# Patient Record
Sex: Male | Born: 1960
Health system: Southern US, Community
[De-identification: ages and names within clinical notes are randomized; demographics above are authoritative.]

## PROBLEM LIST (undated history)

## (undated) DIAGNOSIS — Z923 Personal history of irradiation: Secondary | ICD-10-CM

## (undated) DIAGNOSIS — IMO0002 Reserved for concepts with insufficient information to code with codable children: Secondary | ICD-10-CM

## (undated) DIAGNOSIS — E785 Hyperlipidemia, unspecified: Secondary | ICD-10-CM

## (undated) DIAGNOSIS — E0590 Thyrotoxicosis, unspecified without thyrotoxic crisis or storm: Secondary | ICD-10-CM

## (undated) DIAGNOSIS — I10 Essential (primary) hypertension: Secondary | ICD-10-CM

## (undated) DIAGNOSIS — R011 Cardiac murmur, unspecified: Secondary | ICD-10-CM

## (undated) DIAGNOSIS — F329 Major depressive disorder, single episode, unspecified: Secondary | ICD-10-CM

## (undated) DIAGNOSIS — B029 Zoster without complications: Secondary | ICD-10-CM

## (undated) DIAGNOSIS — G7080 Lambert-Eaton syndrome, unspecified: Secondary | ICD-10-CM

## (undated) DIAGNOSIS — M549 Dorsalgia, unspecified: Secondary | ICD-10-CM

## (undated) DIAGNOSIS — E669 Obesity, unspecified: Secondary | ICD-10-CM

## (undated) DIAGNOSIS — R55 Syncope and collapse: Secondary | ICD-10-CM

## (undated) DIAGNOSIS — R9430 Abnormal result of cardiovascular function study, unspecified: Secondary | ICD-10-CM

## (undated) DIAGNOSIS — Z9289 Personal history of other medical treatment: Secondary | ICD-10-CM

## (undated) DIAGNOSIS — I4581 Long QT syndrome: Secondary | ICD-10-CM

## (undated) DIAGNOSIS — Q245 Malformation of coronary vessels: Secondary | ICD-10-CM

## (undated) DIAGNOSIS — S82409A Unspecified fracture of shaft of unspecified fibula, initial encounter for closed fracture: Secondary | ICD-10-CM

## (undated) DIAGNOSIS — M1990 Unspecified osteoarthritis, unspecified site: Secondary | ICD-10-CM

## (undated) DIAGNOSIS — E119 Type 2 diabetes mellitus without complications: Secondary | ICD-10-CM

## (undated) DIAGNOSIS — F32A Depression, unspecified: Secondary | ICD-10-CM

## (undated) HISTORY — DX: Cardiac murmur, unspecified: R01.1

## (undated) HISTORY — DX: Type 2 diabetes mellitus without complications: E11.9

## (undated) HISTORY — DX: Thyrotoxicosis, unspecified without thyrotoxic crisis or storm: E05.90

## (undated) HISTORY — DX: Syncope and collapse: R55

## (undated) HISTORY — DX: Hyperlipidemia, unspecified: E78.5

## (undated) HISTORY — DX: Malformation of coronary vessels: Q24.5

## (undated) HISTORY — DX: Zoster without complications: B02.9

## (undated) HISTORY — DX: Lambert-Eaton syndrome, unspecified: G70.80

## (undated) HISTORY — DX: Personal history of irradiation: Z92.3

## (undated) HISTORY — DX: Essential (primary) hypertension: I10

## (undated) HISTORY — DX: Long QT syndrome: I45.81

## (undated) HISTORY — PX: PORTACATH PLACEMENT: SHX2246

## (undated) HISTORY — DX: Obesity, unspecified: E66.9

## (undated) HISTORY — DX: Reserved for concepts with insufficient information to code with codable children: IMO0002

## (undated) HISTORY — PX: TONSILLECTOMY: SUR1361

## (undated) HISTORY — DX: Unspecified fracture of shaft of unspecified fibula, initial encounter for closed fracture: S82.409A

## (undated) HISTORY — PX: KNEE SURGERY: SHX244

## (undated) HISTORY — DX: Abnormal result of cardiovascular function study, unspecified: R94.30

---

## 1998-03-11 ENCOUNTER — Ambulatory Visit (HOSPITAL_COMMUNITY): Admission: RE | Admit: 1998-03-11 | Discharge: 1998-03-11 | Payer: Self-pay | Admitting: Neurology

## 1998-04-01 ENCOUNTER — Encounter (HOSPITAL_COMMUNITY): Admission: RE | Admit: 1998-04-01 | Discharge: 1998-06-30 | Payer: Self-pay | Admitting: Neurology

## 1998-07-15 ENCOUNTER — Encounter (HOSPITAL_COMMUNITY): Admission: RE | Admit: 1998-07-15 | Discharge: 1998-10-13 | Payer: Self-pay | Admitting: Neurology

## 1998-10-14 ENCOUNTER — Encounter (HOSPITAL_COMMUNITY): Admission: RE | Admit: 1998-10-14 | Discharge: 1999-01-12 | Payer: Self-pay | Admitting: Neurology

## 1999-01-13 ENCOUNTER — Encounter (HOSPITAL_COMMUNITY): Admission: RE | Admit: 1999-01-13 | Discharge: 1999-04-13 | Payer: Self-pay | Admitting: Neurology

## 1999-04-15 ENCOUNTER — Encounter: Admission: RE | Admit: 1999-04-15 | Discharge: 1999-07-14 | Payer: Self-pay | Admitting: Neurology

## 1999-07-15 ENCOUNTER — Encounter (HOSPITAL_COMMUNITY): Admission: RE | Admit: 1999-07-15 | Discharge: 1999-10-13 | Payer: Self-pay | Admitting: Neurology

## 1999-09-24 ENCOUNTER — Ambulatory Visit (HOSPITAL_COMMUNITY): Admission: RE | Admit: 1999-09-24 | Discharge: 1999-09-24 | Payer: Self-pay | Admitting: Family Medicine

## 1999-10-03 ENCOUNTER — Encounter: Payer: Self-pay | Admitting: Vascular Surgery

## 1999-10-03 ENCOUNTER — Ambulatory Visit (HOSPITAL_COMMUNITY): Admission: RE | Admit: 1999-10-03 | Discharge: 1999-10-03 | Payer: Self-pay | Admitting: Vascular Surgery

## 1999-10-20 ENCOUNTER — Encounter (HOSPITAL_COMMUNITY): Admission: RE | Admit: 1999-10-20 | Discharge: 2000-01-18 | Payer: Self-pay | Admitting: Neurology

## 1999-10-28 ENCOUNTER — Emergency Department (HOSPITAL_COMMUNITY): Admission: EM | Admit: 1999-10-28 | Discharge: 1999-10-28 | Payer: Self-pay | Admitting: Emergency Medicine

## 1999-11-12 ENCOUNTER — Encounter: Payer: Self-pay | Admitting: Vascular Surgery

## 1999-11-12 ENCOUNTER — Ambulatory Visit (HOSPITAL_COMMUNITY): Admission: RE | Admit: 1999-11-12 | Discharge: 1999-11-12 | Payer: Self-pay | Admitting: Vascular Surgery

## 1999-12-25 ENCOUNTER — Ambulatory Visit (HOSPITAL_COMMUNITY): Admission: RE | Admit: 1999-12-25 | Discharge: 1999-12-25 | Payer: Self-pay | Admitting: Vascular Surgery

## 1999-12-25 ENCOUNTER — Encounter: Payer: Self-pay | Admitting: Vascular Surgery

## 2000-01-12 ENCOUNTER — Ambulatory Visit (HOSPITAL_COMMUNITY): Admission: RE | Admit: 2000-01-12 | Discharge: 2000-01-12 | Payer: Self-pay | Admitting: Vascular Surgery

## 2000-01-12 ENCOUNTER — Encounter: Payer: Self-pay | Admitting: Vascular Surgery

## 2000-01-20 ENCOUNTER — Encounter (HOSPITAL_COMMUNITY): Admission: RE | Admit: 2000-01-20 | Discharge: 2000-04-19 | Payer: Self-pay | Admitting: Neurology

## 2000-04-20 ENCOUNTER — Encounter (HOSPITAL_COMMUNITY): Admission: RE | Admit: 2000-04-20 | Discharge: 2000-07-19 | Payer: Self-pay | Admitting: Neurology

## 2000-07-20 ENCOUNTER — Encounter (HOSPITAL_COMMUNITY): Admission: RE | Admit: 2000-07-20 | Discharge: 2000-10-18 | Payer: Self-pay | Admitting: Neurology

## 2000-10-19 ENCOUNTER — Encounter (HOSPITAL_COMMUNITY): Admission: RE | Admit: 2000-10-19 | Discharge: 2001-01-17 | Payer: Self-pay | Admitting: Neurology

## 2000-11-12 ENCOUNTER — Encounter: Payer: Self-pay | Admitting: Neurology

## 2001-01-18 ENCOUNTER — Encounter (HOSPITAL_COMMUNITY): Admission: RE | Admit: 2001-01-18 | Discharge: 2001-04-18 | Payer: Self-pay | Admitting: Neurology

## 2001-04-19 ENCOUNTER — Encounter (HOSPITAL_COMMUNITY): Admission: RE | Admit: 2001-04-19 | Discharge: 2001-07-18 | Payer: Self-pay | Admitting: Neurology

## 2001-07-19 ENCOUNTER — Encounter (HOSPITAL_COMMUNITY): Admission: RE | Admit: 2001-07-19 | Discharge: 2001-10-17 | Payer: Self-pay | Admitting: Neurology

## 2001-10-18 ENCOUNTER — Encounter (HOSPITAL_COMMUNITY): Admission: RE | Admit: 2001-10-18 | Discharge: 2002-01-16 | Payer: Self-pay | Admitting: Neurology

## 2002-01-17 ENCOUNTER — Encounter (HOSPITAL_COMMUNITY): Admission: RE | Admit: 2002-01-17 | Discharge: 2002-04-17 | Payer: Self-pay | Admitting: Neurology

## 2002-03-06 ENCOUNTER — Encounter: Payer: Self-pay | Admitting: Vascular Surgery

## 2002-03-06 ENCOUNTER — Ambulatory Visit (HOSPITAL_COMMUNITY): Admission: RE | Admit: 2002-03-06 | Discharge: 2002-03-06 | Payer: Self-pay | Admitting: Vascular Surgery

## 2002-04-20 ENCOUNTER — Encounter (HOSPITAL_COMMUNITY): Admission: RE | Admit: 2002-04-20 | Discharge: 2002-07-19 | Payer: Self-pay | Admitting: Neurology

## 2002-04-21 ENCOUNTER — Encounter: Payer: Self-pay | Admitting: Vascular Surgery

## 2002-04-21 ENCOUNTER — Ambulatory Visit (HOSPITAL_COMMUNITY): Admission: RE | Admit: 2002-04-21 | Discharge: 2002-04-21 | Payer: Self-pay | Admitting: Vascular Surgery

## 2002-06-01 ENCOUNTER — Inpatient Hospital Stay (HOSPITAL_COMMUNITY): Admission: AD | Admit: 2002-06-01 | Discharge: 2002-06-02 | Payer: Self-pay | Admitting: Neurology

## 2002-07-25 ENCOUNTER — Encounter (HOSPITAL_COMMUNITY): Admission: RE | Admit: 2002-07-25 | Discharge: 2002-10-23 | Payer: Self-pay | Admitting: Neurology

## 2002-10-24 ENCOUNTER — Encounter (HOSPITAL_COMMUNITY): Admission: RE | Admit: 2002-10-24 | Discharge: 2003-01-22 | Payer: Self-pay | Admitting: Neurology

## 2003-01-23 ENCOUNTER — Encounter (HOSPITAL_COMMUNITY): Admission: RE | Admit: 2003-01-23 | Discharge: 2003-04-23 | Payer: Self-pay | Admitting: Neurology

## 2003-04-24 ENCOUNTER — Encounter (HOSPITAL_COMMUNITY): Admission: RE | Admit: 2003-04-24 | Discharge: 2003-07-23 | Payer: Self-pay | Admitting: Neurology

## 2003-07-24 ENCOUNTER — Encounter (HOSPITAL_COMMUNITY): Admission: RE | Admit: 2003-07-24 | Discharge: 2003-10-22 | Payer: Self-pay | Admitting: Neurology

## 2003-08-03 ENCOUNTER — Emergency Department (HOSPITAL_COMMUNITY): Admission: EM | Admit: 2003-08-03 | Discharge: 2003-08-03 | Payer: Self-pay | Admitting: Emergency Medicine

## 2003-11-21 ENCOUNTER — Encounter (HOSPITAL_COMMUNITY): Admission: RE | Admit: 2003-11-21 | Discharge: 2004-02-19 | Payer: Self-pay | Admitting: Neurology

## 2004-03-05 ENCOUNTER — Encounter (HOSPITAL_COMMUNITY): Admission: RE | Admit: 2004-03-05 | Discharge: 2004-06-03 | Payer: Self-pay | Admitting: Neurology

## 2004-06-04 ENCOUNTER — Encounter (HOSPITAL_COMMUNITY): Admission: RE | Admit: 2004-06-04 | Discharge: 2004-09-02 | Payer: Self-pay | Admitting: Neurology

## 2004-08-07 ENCOUNTER — Ambulatory Visit: Payer: Self-pay | Admitting: Family Medicine

## 2004-08-28 ENCOUNTER — Ambulatory Visit: Payer: Self-pay | Admitting: Family Medicine

## 2004-09-23 ENCOUNTER — Encounter (HOSPITAL_COMMUNITY): Admission: RE | Admit: 2004-09-23 | Discharge: 2004-12-22 | Payer: Self-pay | Admitting: Neurology

## 2004-10-29 ENCOUNTER — Ambulatory Visit: Payer: Self-pay | Admitting: Family Medicine

## 2004-12-16 ENCOUNTER — Ambulatory Visit: Payer: Self-pay | Admitting: Family Medicine

## 2004-12-18 ENCOUNTER — Ambulatory Visit (HOSPITAL_COMMUNITY): Admission: RE | Admit: 2004-12-18 | Discharge: 2004-12-18 | Payer: Self-pay | Admitting: Family Medicine

## 2004-12-23 ENCOUNTER — Encounter (HOSPITAL_COMMUNITY): Admission: RE | Admit: 2004-12-23 | Discharge: 2005-03-23 | Payer: Self-pay | Admitting: Neurology

## 2004-12-25 ENCOUNTER — Emergency Department (HOSPITAL_COMMUNITY): Admission: EM | Admit: 2004-12-25 | Discharge: 2004-12-25 | Payer: Self-pay | Admitting: Emergency Medicine

## 2005-01-14 ENCOUNTER — Ambulatory Visit: Payer: Self-pay | Admitting: Internal Medicine

## 2005-01-15 ENCOUNTER — Ambulatory Visit: Payer: Self-pay | Admitting: Internal Medicine

## 2005-01-15 ENCOUNTER — Ambulatory Visit (HOSPITAL_COMMUNITY): Admission: RE | Admit: 2005-01-15 | Discharge: 2005-01-15 | Payer: Self-pay | Admitting: Internal Medicine

## 2005-01-19 ENCOUNTER — Encounter (HOSPITAL_COMMUNITY): Admission: RE | Admit: 2005-01-19 | Discharge: 2005-02-18 | Payer: Self-pay | Admitting: Internal Medicine

## 2005-01-28 ENCOUNTER — Ambulatory Visit (HOSPITAL_COMMUNITY): Admission: RE | Admit: 2005-01-28 | Discharge: 2005-01-28 | Payer: Self-pay | Admitting: Gastroenterology

## 2005-02-10 ENCOUNTER — Ambulatory Visit: Payer: Self-pay | Admitting: Family Medicine

## 2005-02-10 ENCOUNTER — Ambulatory Visit (HOSPITAL_COMMUNITY): Admission: RE | Admit: 2005-02-10 | Discharge: 2005-02-10 | Payer: Self-pay | Admitting: Family Medicine

## 2005-02-11 ENCOUNTER — Ambulatory Visit: Payer: Self-pay | Admitting: Family Medicine

## 2005-03-23 ENCOUNTER — Ambulatory Visit (HOSPITAL_COMMUNITY): Admission: RE | Admit: 2005-03-23 | Discharge: 2005-03-23 | Payer: Self-pay | Admitting: Neurology

## 2005-03-23 ENCOUNTER — Encounter (HOSPITAL_COMMUNITY): Admission: RE | Admit: 2005-03-23 | Discharge: 2005-06-21 | Payer: Self-pay | Admitting: Neurology

## 2005-04-01 ENCOUNTER — Encounter: Admission: RE | Admit: 2005-04-01 | Discharge: 2005-04-01 | Payer: Self-pay | Admitting: Gastroenterology

## 2005-06-26 ENCOUNTER — Ambulatory Visit: Payer: Self-pay | Admitting: Cardiology

## 2005-06-26 ENCOUNTER — Ambulatory Visit: Payer: Self-pay | Admitting: Family Medicine

## 2005-06-26 ENCOUNTER — Inpatient Hospital Stay (HOSPITAL_COMMUNITY): Admission: EM | Admit: 2005-06-26 | Discharge: 2005-06-27 | Payer: Self-pay | Admitting: Emergency Medicine

## 2005-07-01 ENCOUNTER — Encounter (HOSPITAL_COMMUNITY): Admission: RE | Admit: 2005-07-01 | Discharge: 2005-09-29 | Payer: Self-pay | Admitting: Neurology

## 2005-07-06 ENCOUNTER — Ambulatory Visit: Payer: Self-pay | Admitting: Family Medicine

## 2005-08-04 ENCOUNTER — Ambulatory Visit: Payer: Self-pay | Admitting: Family Medicine

## 2005-08-10 ENCOUNTER — Ambulatory Visit: Payer: Self-pay | Admitting: Family Medicine

## 2005-09-02 ENCOUNTER — Ambulatory Visit: Payer: Self-pay | Admitting: Family Medicine

## 2005-10-08 ENCOUNTER — Encounter (HOSPITAL_COMMUNITY): Admission: RE | Admit: 2005-10-08 | Discharge: 2005-12-25 | Payer: Self-pay | Admitting: Neurology

## 2006-02-05 ENCOUNTER — Encounter (HOSPITAL_COMMUNITY): Admission: RE | Admit: 2006-02-05 | Discharge: 2006-05-06 | Payer: Self-pay | Admitting: Neurology

## 2006-05-11 ENCOUNTER — Encounter (HOSPITAL_COMMUNITY): Admission: RE | Admit: 2006-05-11 | Discharge: 2006-08-09 | Payer: Self-pay | Admitting: Neurology

## 2006-08-30 IMAGING — RF DG UGI W/ SMALL BOWEL
13 of 21 series · 13 of 21 positions shown · non-contrast
Comparison: none

CLINICAL DATA: Abdominal pain with nausea and diarrhea. 
 KUB, UPPER G.I. WITH SMALL BOWEL: 
 KUB:  Unremarkable.  
 UPPER G.I. AND SMALL BOWEL:
 Because of the patient?s neurological condition, we were not able to do a double contrast exam.  The GI and small bowel was done with thin barium.
 Swallowing mechanism normal.  No lesions of the esophagus demonstrated.  No obvious ulceration or lesions of the stomach or duodenal bulb.  C-loop and remainder of duodenum normal.  Overhead images at 0 and 30 minutes post ingestion were obtained.  At 30 minutes, contrast was already well into the colon.  At fluoroscopy, peristalsis is brisk, but there are no lesions demonstrated.

[Series 1: run · 1 of 1 slices shown (1 of 12)]
[im 1/1]
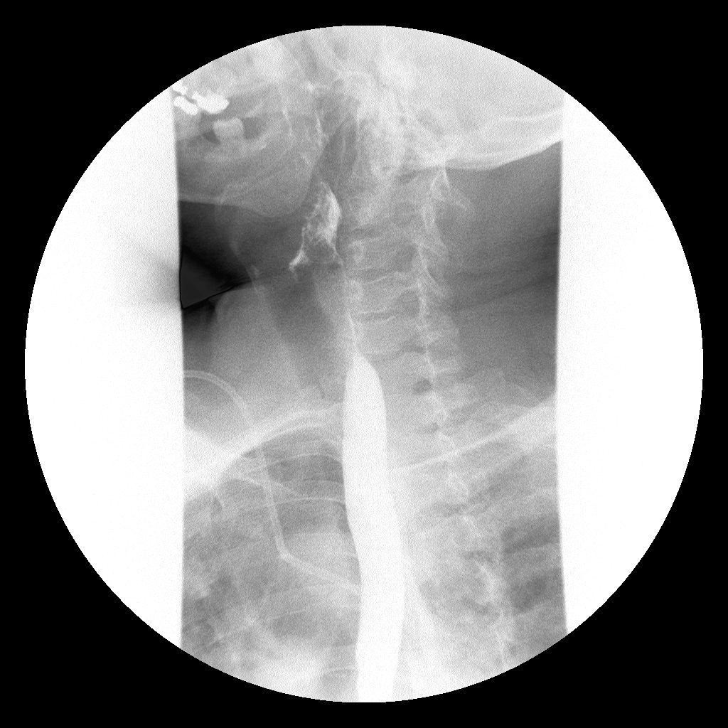

[Series 3: run · 1 of 1 slices shown (2 of 12)]
[im 1/1]
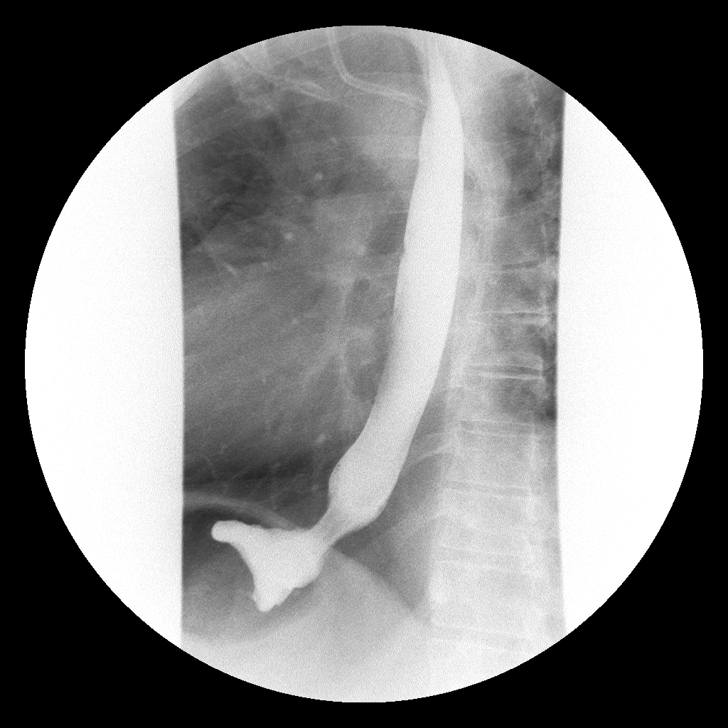

[Series 5: run · 1 of 1 slices shown (3 of 12)]
[im 1/1]
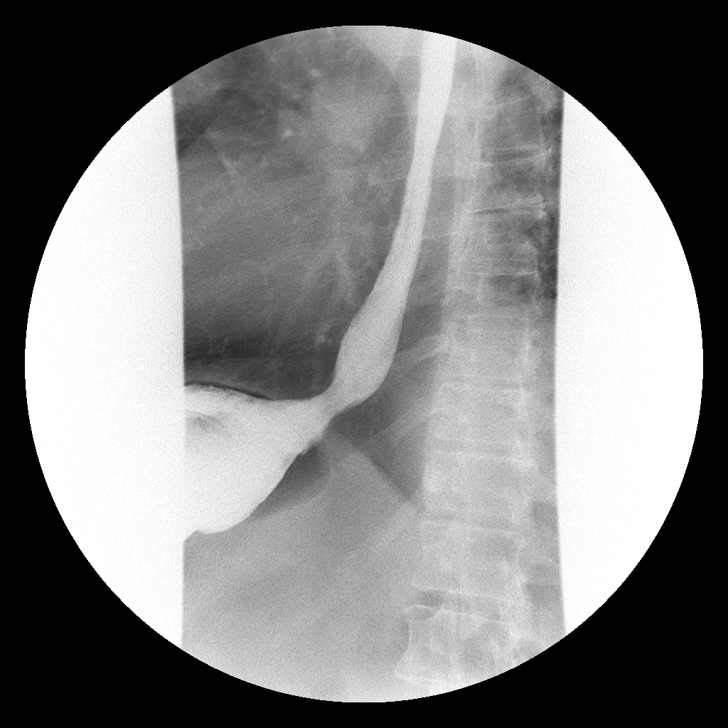

[Series 6: run · 1 of 1 slices shown (4 of 12)]
[im 1/1]
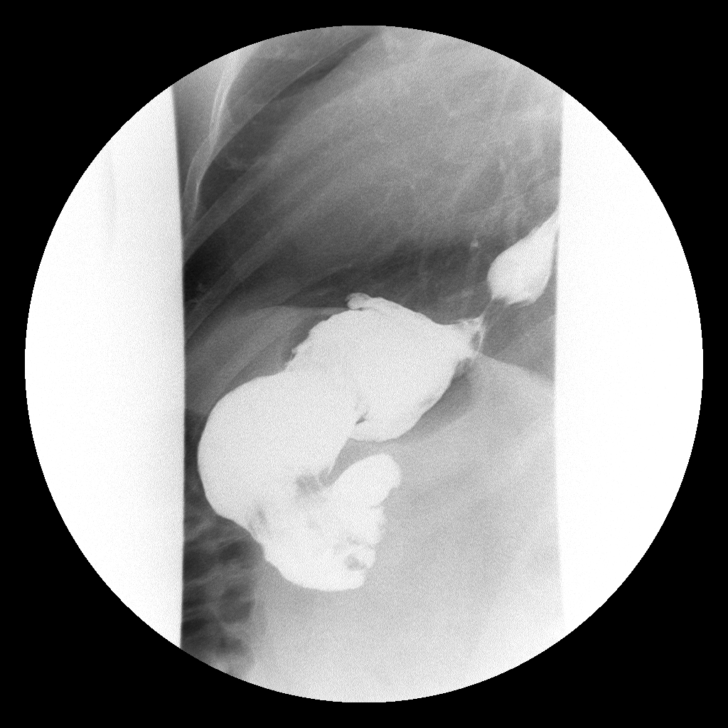

[Series 8: run · 1 of 1 slices shown (5 of 12)]
[im 1/1]
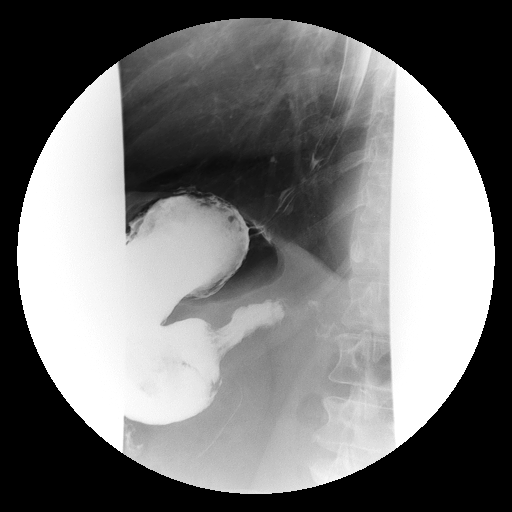

[Series 9: run · 1 of 1 slices shown (6 of 12)]
[im 1/1]
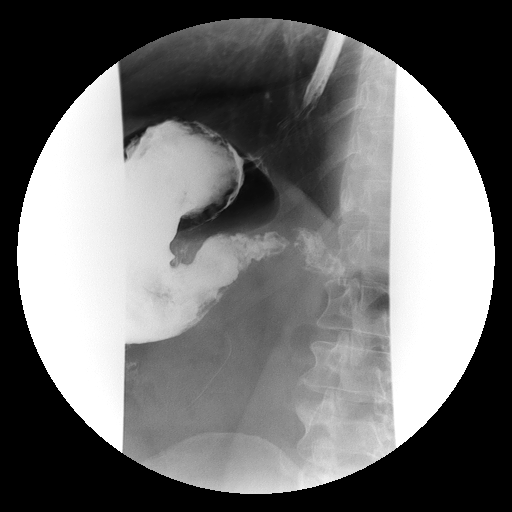

[Series 11: run · 1 of 1 slices shown (7 of 12)]
[im 1/1]
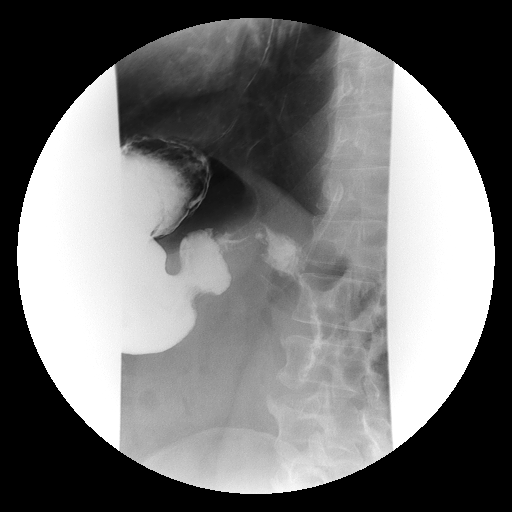

[Series 13: run · 1 of 1 slices shown (8 of 12)]
[im 1/1]
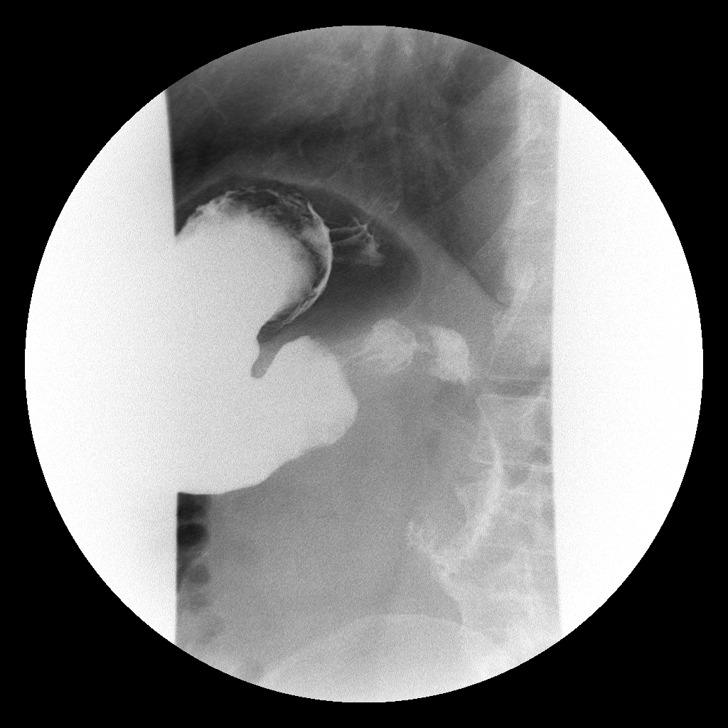

[Series 14: run · 1 of 1 slices shown (9 of 12)]
[im 1/1]
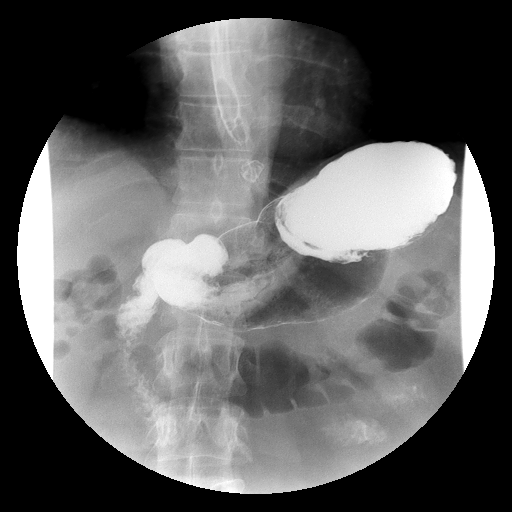

[Series 16: run · 1 of 1 slices shown (10 of 12)]
[im 1/1]
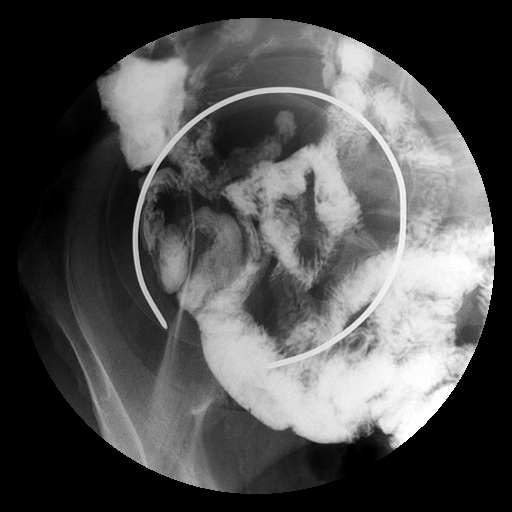

[Series 17: run · 1 of 1 slices shown (11 of 12)]
[im 1/1]
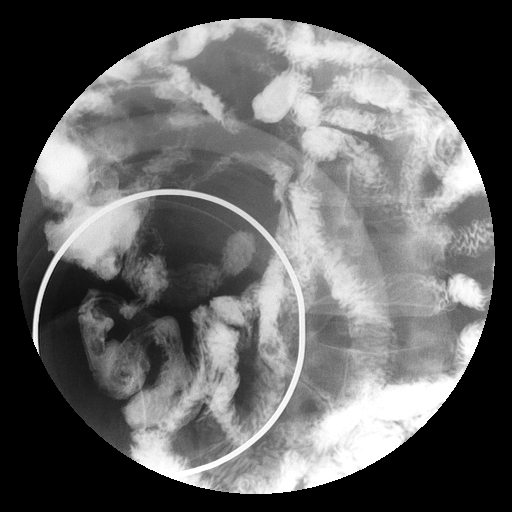

[Series 19: run · 1 of 1 slices shown (12 of 12)]
[im 1/1]
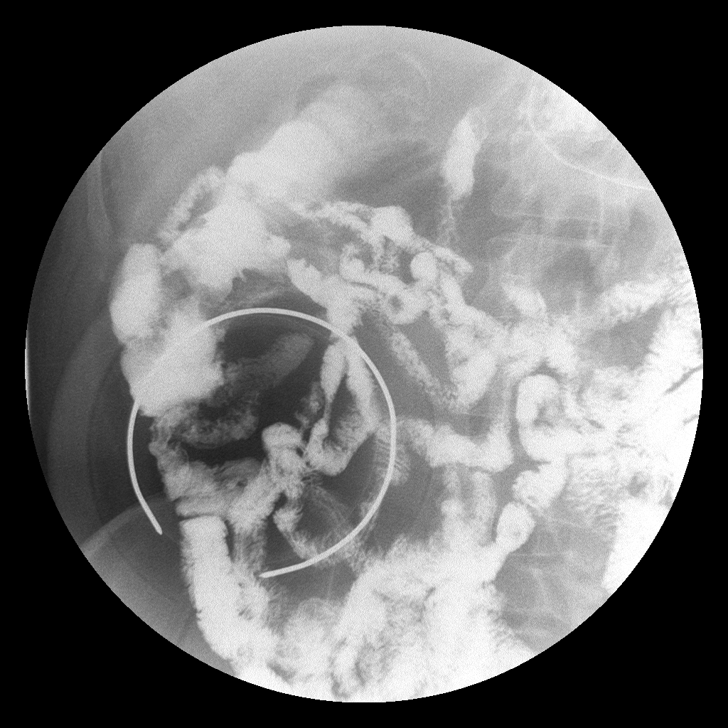

[Series 1002: view not recorded · 0.20mm/px · 1 of 1 slices shown]
[im 1/1]
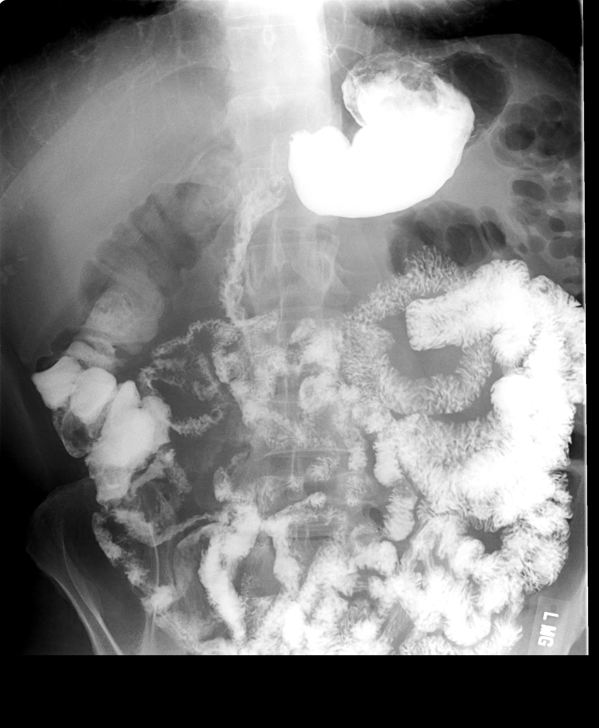

[13 of 21 positions shown; findings below may reference images not displayed]

IMPRESSION: 1.  No pathology in the upper G.I. tract demonstrated.
 2.  The small bowel shows no lesions, but there is brisk peristalsis with rapid transit to the colon.

## 2006-11-17 ENCOUNTER — Encounter (HOSPITAL_COMMUNITY): Admission: RE | Admit: 2006-11-17 | Discharge: 2007-02-15 | Payer: Self-pay | Admitting: Neurology

## 2007-02-16 ENCOUNTER — Encounter (HOSPITAL_COMMUNITY): Admission: RE | Admit: 2007-02-16 | Discharge: 2007-04-13 | Payer: Self-pay | Admitting: Neurology

## 2008-05-24 ENCOUNTER — Encounter: Admission: RE | Admit: 2008-05-24 | Discharge: 2008-08-22 | Payer: Self-pay | Admitting: Orthopedic Surgery

## 2010-05-03 ENCOUNTER — Encounter: Payer: Self-pay | Admitting: Gastroenterology
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
## 2010-08-29 NOTE — Consult Note (Signed)
[name], [name]                ACCOUNT NO.:  [id number]   MEDICAL RECORD NO.:  [id number]          PATIENT TYPE:  AMB   LOCATION:  DAY                           FACILITY:  [facility]   PHYSICIAN:  Lionel December, M.D.    DATE OF BIRTH:  11-Oct-1960   DATE OF CONSULTATION:  01/14/2005  DATE OF DISCHARGE:                                   CONSULTATION   REASON FOR CONSULTATION:  Right upper quadrant abdominal pain and  indigestion.   HISTORY OF PRESENT ILLNESS:  The patient is a 50 year old Caucasian  gentleman with a history of myasthenia gravis.  He presents today for  further evaluation of right upper quadrant abdominal pain and indigestion.  For the past several months he has been having problems with indigestion and  heartburn. It is so severe at times that it goes into his neck.  Recently he  had burning into his neck and left arm and went to [facility] and  was evaluated for an MI.  A workup was negative including cardiac enzymes.  He also had an unremarkable chest x-ray.  He was given Pepcid, which has not  seemed to help.  He also notes that he has right upper quadrant abdominal  pain, which radiates into his right shoulder blade.  When he lays down he  feels a lot of burning and hot feeling, which goes up into his chest.  He  has taken Mylanta, which seems to help.  He feels like his whole esophagus  is raw.  He is having a lot of belching.  He has a problem swallowing his  pills at times.  The right upper quadrant pain is worse about 1-1/2 to 2  hours after meals.  He has had nausea but vomited only once.  He has never  had an EGD.  He had an abdominal ultrasound, which revealed diffuse fatty  infiltration of the liver.  He has had a CT of the abdomen and pelvis, which  again showed diffuse fatty infiltration of the liver.  He had a tiny  periumbilical anterior abdominal wall hernia containing only fat.  Otherwise  unremarkable.  He has a normal amylase, lipase, LFTs,  and an unremarkable  CBC.  He was tried on Bentyl without relief.  He also received a course of  Cipro for five days, which did not seem to help.  When he went to the ED he  had repeat LFTs, which were normal.  About four days ago he bought some OTC  Prilosec, which has not made a difference as of yet.   CURRENT MEDICATIONS:  1.  Prilosec OTC 20 mg daily.  2.  Pyridostigmine 60 mg 1/4 tablet t.i.d.  3.  Hydrocodone 5/500 q.4-6 h. p.r.n.  4.  Diaminoprridine 10 mg five daily.  5.  Lunesta 3 mg q.h.s.   ALLERGIES:  NO KNOWN DRUG ALLERGIES.   PAST MEDICAL HISTORY:  1.  Hypertension.  2.  Bulging disk in his back.  3.  Lambert-Eaton myasthenia gravis for 12 years.  He has been followed at  [facility].  He received IgG treatments for five years.  Most recently he took      two rounds of Rituximab but had been doing very well since then with      regards to his myasthenia.  4.  He has a Leisure centre manager.  5.  He has had a tonsillectomy.  6.  Knee arthroscopy.   FAMILY HISTORY:  Both his mother and maternal grandmother had a  cholecystectomy for biliary dyskinesia.  His father died of alcoholic  cirrhosis.  He has a sister with some sort of autoimmune disease, which is  affecting her eye sight of one eye.   SOCIAL HISTORY:  He is married and has two children.  He is on disability.  He has never been a smoker.  He denies any alcohol use.   REVIEW OF SYSTEMS:  GASTROINTESTINAL:  See HPI.  CONSTITUTIONAL:  He has  lost about 10 pounds.  He has progressive weakness throughout the day  related to his myasthenia.  CARDIOPULMONARY:  At times he complains of  shortness of breath.  No current chest pain.   PHYSICAL EXAMINATION:  VITAL SIGNS:  Weight 208, height 5 feet 10 inches.  Temperature 98, blood pressure 120/78, pulse 64.  GENERAL:  A pleasant well-nourished well-developed Caucasian male in no  acute distress.  SKIN:  Warm and dry.  No jaundice.  HEENT:  Conjunctivae are pink.  Sclerae  nonicteric.  Oral pharyngeal mucosa  is moist and pink.  No lesions, erythema, or exudate.  NECK:  No lymphadenopathy or thyromegaly.  CHEST/LUNGS:  Clear to auscultation.  CARDIAC:  Reveals a regular rate and rhythm.  Normal S1 and S2.  No murmurs,  rubs, or gallops.  ABDOMEN:  Positive bowel sounds.  Soft, nondistended.  He has mild  epigastric tenderness with moderate tenderness in the right upper quadrant  region to deep palpation.  No organomegaly or masses.  No rebound tenderness  or guarding.  No abdominal bruits.  He has a small umbilical hernia easily  reducible and nontender.  EXTREMITIES:  No edema.   IMPRESSION:  [name] is a 50 year old gentleman with a several month  history of right upper quadrant abdominal pain, which occurs post prandial.  Also with poorly controlled typical heartburn-type symptoms.  He has had  nausea and dysphasia to pills as well.  An abdominal ultrasound revealed a  normal appearing gallbladder.  We cannot exclude biliary dyskinesia.  He  does have typical heartburn symptoms, which are not responding to proton  pump inhibitor therapy and needs to be further investigated.  Not mentioned  above, the patient has had some diarrhea more recently.  He is having greasy  stools two and three times a day, primarily post prandial.  No blood in the  stool noted.  The etiology is unclear at this time.   PLAN:  1.  Urgent EGD.  2.  HIDA scan with CCK.  3.  Continue Prilosec OTC.  4.  Further recommendations to follow.   I would like to thank Dr. Lysbeth Galas for allowing [place] to take part in the care of  this patient.      Tana Coast, P.A.      Lionel December, M.D.  Electronically Signed    LL/MEDQ  D:  01/14/2005  T:  01/14/2005  Job:  [id number]

## 2010-08-29 NOTE — Op Note (Signed)
[name], [name]                ACCOUNT NO.:  [id number]   MEDICAL RECORD NO.:  [id number]          PATIENT TYPE:  AMB   LOCATION:  DAY                           FACILITY:  [facility]   PHYSICIAN:  Lionel December, M.D.    DATE OF BIRTH:  Mar 29, 1961   DATE OF PROCEDURE:  01/15/2005  DATE OF DISCHARGE:                                 OPERATIVE REPORT   PROCEDURE:  Esophagogastroduodenoscopy.   INDICATIONS:  [name] is a 49 year old Caucasian male who presents with severe  pain in his right upper quadrant located laterally, radiating posteriorly to  interscapular region. He also complains of burning epigastric pain and  heartburn. He took Prevacid for two weeks without symptomatic improvement,  and  now he is on Prilosec. He is undergone ultrasonography and CT. Both  studies have been negative other than incidental finding of fatty liver, and  his LFTs have been normal. He is undergoing diagnostic EGD. If this study is  normal, will proceed with hepatobiliary scan although I am not convinced  that his symptoms are typical of biliary tract disease.   Procedure risks were reviewed with the patient, and informed consent was  obtained.   PREMEDICATION:  Cetacaine spray for pharyngeal topical anesthesia, Demerol  50 mg IV, Versed 5 mg IV in divided dose.   FINDINGS:  Procedure performed in endoscopy suite. The patient's vital signs  and O2 saturation were monitored during the procedure and remained stable.  The patient was placed in left lateral position, and Olympus videoscope was  passed via oropharynx without any difficulty into esophagus.   Esophagus. Mucosa of the esophagus was normal throughout. GE junction was at  40 cm and no hernia was noted.   Stomach. It was empty and distended very well insufflation. Folds of  proximal stomach were normal. Examination mucosa at body, antrum, pyloric  channel as well as angularis, fundus and cardia was normal.   Duodenum. Bulbar mucosa was normal.  Scope was passed to the second part of  duodenum where mucosa and folds were normal. Endoscope was withdrawn. The  patient tolerated the procedure well.   FINAL DIAGNOSIS:  Normal esophagogastroduodenoscopy.   RECOMMENDATIONS:  Will proceed with hepatobiliary scan with CCK.   For the time being, he will continue Prilosec at 20 mg p.o. q.a.m.   If his HIDA is normal, would ask that he be seen by his neurologist at [facility]  to make sure that this is not a neuropathic pain somehow related to his  myasthenia gravis.      Lionel December, M.D.  Electronically Signed     NR/MEDQ  D:  01/15/2005  T:  01/15/2005  Job:  [id number]   cc:   Delaney Meigs, M.D.  Fax: [phone]

## 2010-08-29 NOTE — Op Note (Signed)
[name]  Patient:    [name], [name]                       MRN: [id number] Proc. Date: 01/12/00 Adm. Date:  [id number] Attending:  Lesly Dukes                           Operative Report  PREOPERATIVE DIAGNOSIS:  Eaton-Lambert syndrome.  POSTOPERATIVE DIAGNOSIS:  Eaton-Lambert syndrome.  PROCEDURE PERFORMED: 1. Placement of new right internal jugular Port-A-Cath. 2. Removal of left subclavian Port-a-cath.  SURGEON:  Larina Earthly, M.D.  ASSISTANT:  Nurse.  ANESTHESIA:  MAC.  COMPLICATIONS:  None.  DISPOSITION:  To the recovery room stable.  DESCRIPTION OF PROCEDURE:  The patient was taken to the operating room and placed in the supine position where the area of the right and left neck, and chest were prepped and draped in the usual sterile fashion.  The patient was placed in Trendelenburg position and using local anesthesia and a finder needle, the right internal jugular vein was identified.  Next using the Seldinger technique, the guidewire was passed down the level of the right atrium.  Next, a separate incision was made over the infraclavicular area on the right lateral chest and a subcutaneous pocket was created inferior to the incision.  A 9.6 [place] Port-a-cath was passed into the pocket.  A tunnelling device was used to pass from this port to the guidewire exit site.  The catheter was cut to allow it to position well down into the right atrium.  The patient had two prior failed ports on the left side with continuous fiber and sheath on these, and it was felt that perhaps placement further down into the right atrium would prevent this from happening.  The dilator and peel away sheath was passed over the guidewire.  The dilator and guidewire were removed and the catheter was positioned down the peel away sheath which was then removed as well.  The lumen flushed and aspirated easily and was locked with heparinized saline.  The  port was sewn to the fascia of the pectoralis major with several interrupted 3-0 Prolene sutures.  The skin incision was closed with 3-0 Vicryl subcutaneous and subcuticular stitch.  Attention was turned to the left subclavian region.  The incision was made through the prior scar and carried down to isolate the prior port.  The catheter was removed in its entirety and the port was removed as well.  The wound was irrigated and was closed with 3-0 Vicryl in subcutaneous and subcuticular tissue.  Benzoin and Steri-Strips were applied. DD:  01/12/00 TD:  01/13/00 Job: [id number]

## 2010-08-29 NOTE — Discharge Summary (Signed)
[name], [name] NO.:  [id number]   MEDICAL RECORD NO.:  [id number]          PATIENT TYPE:  INP   LOCATION:  5511                         FACILITY:  [facility]   PHYSICIAN:  Rollene Rotunda, M.D.   DATE OF BIRTH:  1960/12/10   DATE OF ADMISSION:  06/26/2005  DATE OF DISCHARGE:  06/27/2005                                 DISCHARGE SUMMARY   PROCEDURES:  Chest CT.   PRIMARY DIAGNOSES:  1.  Chest pain, likely musculoskeletal in nature.  2.  Hypertension.  3.  Gastroesophageal reflux disease symptom.  4.  History of the Eaton-Lambert myasthenia syndrome.  5.  Status post cardiac catheterization in 2004 with no coronary artery      disease.  6.  Gastroesophageal reflux disease.  7.  History of pleurisy.  8.  History of gastroparesis.  9.  Status post tonsillectomy.  10. Hyperlipidemia.   ALLERGIES:  He is allergic to IGG INFUSION with hives and shortness of  breath.   TIME OF DISCHARGE:  28 minutes P.A. time.   HOSPITAL COURSE:  [name] is a 50 year old male with known history of  coronary artery disease. He had a cath in 2004 for chest pain that occurred  during an immunoglobulin infusion. On the day of admission, he woke up at 3  a.m. with sharp, mid-axillary pain that was not associated with shortness of  breath or nausea but he had some feelings of indigestion as well as  dizziness. His pain resolved but recurred again at approximately noon. He  came to the emergency room where he was admitted for further evaluation and  treatment.   [name] had cardiac enzymes that were negative for MI. He had tenderness  to his left axillary area. He had a potassium level of 3.2 and this was  supplemented. After the supplementation, [name] stated that he felt  much better. He is compliant with all of his other medications and he is to  have a NSAID added to his medication regimen for possible pleuritic pain. He  is to get BMET next week. Dr. Lysbeth Galas and  Dr. Jens Som are to get those  results. [name] was considered stable for discharge on June 27, 2005  with outpatient follow-up arranged.   LABORATORY VALUES:  Hemoglobin 14.6, hematocrit 42.4, WBC 6.8, platelets  158,000. Sodium 140, potassium 3.04, chloride 106, CO2 25, BUN 6, creatinine  0.9, glucose 93. Serial CK-MB and troponin I negative for MI. TSH 0.659,  total cholesterol 201, triglycerides 134, HDL 39, LDL 135.   CT angiogram of the chest showed no pulmonary embolus and no acute findings.   DISCHARGE INSTRUCTIONS:  His activity level is to be increased gradually. He  is to stick to a low-fat diet. He is to follow up with Dr. Antoine Poche as  needed and he is to see Dr. Lysbeth Galas within two weeks. He is to get a BMET  next week to follow up on his low potassium level. He is encouraged to eat  bananas and drink orange juice on a regular basis.   DISCHARGE MEDICATIONS:  1.  Ibuprofen 600 milligrams t.i.d. or q.i.d.  2.  Mestinon 60 milligrams 3/4 of a tablet t.i.d.  3.  Adapt 10 milligrams and 60 milligrams p.r.n.  4.  Hydrocodone p.r.n.  5.  Zegerid milligrams b.i.d.  6.  Lunesta 3 milligrams q.h.s. p.r.n.  7.  Fiber tablets 2 daily.      Theodore Demark, P.A. [facility]    ______________________________  Rollene Rotunda, M.D.    RB/MEDQ  D:  06/27/2005  T:  06/29/2005  Job:  [id number]   cc:   Delaney Meigs, M.D.  Fax: [phone]

## 2010-08-29 NOTE — Cardiovascular Report (Signed)
NAME:  RAMSES, KLECKA                          ACCOUNT NO.:  [id number]   MEDICAL RECORD NO.:  [id number]                   PATIENT TYPE:  INP   LOCATION:  3707                                 FACILITY:  [facility]   PHYSICIAN:  Aram Candela. Tysinger, M.D.              DATE OF BIRTH:  04-Sep-1960   DATE OF PROCEDURE:  06/02/2002  DATE OF DISCHARGE:                              CARDIAC CATHETERIZATION   REFERRING PHYSICIANS:  Jaclyn Prime. Lucas Mallow, M.D. and Stephanie Acre, M.D.   PROCEDURES:  1. Left heart catheterization.  2. Coronary cineangiography.  3. Left ventricular cineangiography.  4. Abdominal angiogram.  5. Perclose of the right femoral artery.   INDICATION FOR PROCEDURES:  This 50 year old male with Lambert-Eaton  Myasthenic syndrome developed severe anterior chest pain yesterday while  receiving immunoglobulin infusion and the pain was relieved after several  nitroglycerin.  He was admitted for care and scheduled for cardiac  catheterization.  He has a history of hypotension.   DESCRIPTION OF PROCEDURE:  After signing an informed consent, the patient  was brought to the cardiac catheterization laboratory where his right groin  was prepped and draped in a sterile fashion and a 6 French introducer sheath  was inserted percutaneously into the right femoral artery.  A 6 French #4  Judkins coronary catheters were used to make injections into the native  coronary arteries. A 6 French pigtail catheter was used to measure pressures  in the left ventricle and aorta and to make mid stream injections into the  left ventricle and abdominal aorta.  The patient tolerated the procedure  well and no complications were noted.  At the end of the procedure, the catheter and sheath were removed from the  right femoral artery and hemostasis was easily obtained with the Perclose  closure system.   MEDICATIONS GIVEN:  None.   HEMODYNAMIC DATA:  Left ventricular pressure 135/0-11, aortic pressure  133/82 with a mean of 106.  Left ventricular ejection fraction was measured  at 62%.   CINE FINDINGS:   CORONARY CINE ANGIOGRAPHY:  1. Left coronary artery:  The ostium appears normal but has an abnormal     anterior takeoff from the anterior cusp.  2. Left anterior descending:  The LAD appears normal without evidence of     atherosclerotic plaque.  There is normal antegrade flow.  The diagonal     and septal branches all appear normal and have normal runoff.  3. Circumflex: The circumflex coronary artery also appears normal without     evidence of atherosclerotic plaque.  There is normal flow and normal     distal runoff.  4. Right coronary artery:  The right coronary artery has an aberrant takeoff     anteriorly, which is immediately adjacent to the takeoff of the aberrant     left coronary artery and the anterior cusp. The right coronary artery     also  appears normal without evidence of atherosclerotic plaque and there     is normal antegrade flow and normal distal runoff.   LEFT VENTRICULAR CINE:  The left ventricular chamber size and contractility  appear normal.  There is a normal concentric contraction with an ejection  fraction that was measured at 62%.  The mitral and aortic valves appear  normal.  The left ventricular wall thickness appears normal.   ABDOMINAL AORTOGRAM:  The abdominal aorta and renal arteries appear normal.   FINAL DIAGNOSES:  1. 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  2. Normal left ventricular function, ejection fraction 63%.  3. Normal abdominal aorta and renal arteries.  4. Normal mitral and aortic valves.  5. Successful Perclose of the right femoral artery.   DISPOSITION:  As per Dr. Lucas Mallow.                                                 John R. Aleen Campi, M.D.    JRT/MEDQ  D:  06/02/2002  T:  06/02/2002   Job:  [id number]   cc:   Jaclyn Prime. Lucas Mallow, M.D.  [address]  [place]  [place] [address]  Fax: [phone]   Stephanie Acre, M.D.   Cardiac Catheterization Lab

## 2010-08-29 NOTE — Consult Note (Signed)
NAMESHEAMUS, HASTING NO.:  [id number]   MEDICAL RECORD NO.:  [id number]          PATIENT TYPE:  INP   LOCATION:  5511                         FACILITY:  [facility]   PHYSICIAN:  Rollene Rotunda, M.D.   DATE OF BIRTH:  07-Feb-1961   DATE OF CONSULTATION:  06/26/2005  DATE OF DISCHARGE:  06/27/2005                                   CONSULTATION   PRIMARY PHYSICIAN:  Delaney Meigs, M.D.   REASON FOR PRESENTATION:  Patient with chest pain.   HISTORY OF PRESENT ILLNESS:  The patient is a 50 year old gentleman with  history of Eaton-Lambert syndrome.  He had chest pain in 2004; however,  cardiac catheterization demonstrated aberrant takeoff of both right and left  coronary arteries from an anterior aortic cusp.  There were no blockages  noted.  The aorta renal arteries were within normal limits.   The patient was doing okay.  He gets about in the house.  He cannot walk  great distances.  He was in bed when he developed chest discomfort.  It was  an axillary sharp discomfort.  It woke him from his sleep.  It was shooting.  It did not radiate to his arm but it did radiate up to his left shoulder.  It was left-sided.  He did notice possibly it was worse with taking a deep  breath and moving.  He got up, went to a recliner, rolled on his right side,  and the pain went away.  He was able to sleep.  He presented to Dr. Lysbeth Galas  where he was felt to look well and was referred for admission.   He does say he is currently having some 3/10 chest discomfort, similar.  At  the peak, it was 8/10.  He has had no associated nausea, vomiting, or  diaphoresis.  He has had no palpitations, presyncope, or syncope.  In  retrospect, he thinks it might have been similar to right-sided pleurisy he  had in the past.   PAST MEDICAL HISTORY:  1.  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  2.  Normal coronaries without anomalous takeoff.  3.   Hypertension.  4.  Gastroesophageal reflux disease.  5.  Pleurisy.  6.  Gastroparesis.  7.  Degenerative disk disease.   PAST SURGICAL HISTORY:  Right knee arthroplasty, tonsillectomy, implantation  of a Port-A-Cath.   ALLERGIES:  IGG CAUSED HIVES AND SHORTNESS OF BREATH.   MEDICATIONS:  1.  Mestinon 45 mg t.i.d.  2.  Zegerid 40 mg b.i.d.  3.  Diaminopyrimidine 10 mg p.o. up to six daily.  4.  Hydrocodone.  5.  Mylanta.  6.  Lunesta.   SOCIAL HISTORY:  Patient lives in [place] with his wife.  He is disabled.  He has two children.  He has never smoked cigarettes or drank alcohol.   FAMILY HISTORY:  Noncontributory for early coronary artery disease.   REVIEW OF SYSTEMS:  Positive for a flushing feeling that he has had over the  last 24 hours radiating from his bottom of  his abdomen up.  Positive for  diarrhea.  Chronic, nonproductive cough.  Weakness.  Mild palpitations and  edema.  Occasional constipation.  Negative for all other systems.  In  particular, he has not had any fevers or chills.   PHYSICAL EXAMINATION:  GENERAL:  The patient looks weak but in no acute  distress.  VITAL SIGNS:  Blood pressure 151/83, heart rate 64 and regular, afebrile.  HEENT:  Eyelids unremarkable.  Pupils equal, round, and reactive to light.  Fundi visualized.  Oral mucosa unremarkable.  NECK:  No jugular venous distention.  Wave form within normal limits.  Carotid upstroke brisk and symmetric with no bruits.  No thyromegaly.  LYMPHATICS:  No cervical, axillary, or inguinal adenopathy.  LUNGS:  Clear to auscultation bilaterally.  BACK:  No costovertebral angle tenderness.  CHEST:  Unremarkable except for point tenderness in the left axilla,  reproductive of his pain.  HEART:  PMI not displaced or sustained.  S1 and S2 within normal limits.  No  S3.  No S4.  No murmurs.  ABDOMEN:  Flat.  Positive bowel sounds, normal in frequency and pitch.  No  bruits, rebound, guarding, no midline pulsatile  mass, hepatomegaly or  splenomegaly.  SKIN:  No rashes or nodules.  EXTREMITIES:  Pulses 2+ throughout.  No edema, cyanosis, or clubbing.  NEURO:  Oriented to person, place, and time.  Cranial nerves II-XII grossly  intact.  Motor grossly intact throughout.   EKG:  Normal sinus rhythm.  Axis within normal limits.  Intervals within  normal limits.  No acute ST wave changes.   CHEST X-RAY:  Pending.   LABS:  Pending.   ASSESSMENT AND PLAN:  1.  Chest discomfort.  The patient's chest discomfort is pleuritic in      nature.  It is similar to his previous pleurisy.  He has some      reproducible component with palpation.  I would be worried about      pulmonary embolism in a gentleman like this.  I think a spiral CT scan      is indicated.  I would not trust the negative predictive value of D-      dimer in a patient with a moderate pretest probability.  He has had      contrast without complications in the past and provided his creatinine      is okay, he can have this.  He will be admitted to rule out myocardial      infarction with telemetry, enzymes, and a repeat EKG in the morning.  If      these are unremarkable, I would discharge him with nonsteroidals;      however, we need to check with the pharmacy to make sure we do not      inhibit the efficacy of his Mestinon with nonsteroidals, several are      listed.  2.  Hypertension.  Followed by the patient's primary care doctor.  We will      watch this in the hospital.  3.  Eaton-Lambert.  He will continue his previous meds.  4.  Gastroesophageal reflux disease.  He will continue Zegerid.           ______________________________  Rollene Rotunda, M.D.     JH/MEDQ  D:  06/26/2005  T:  06/29/2005  Job:  [id number]   cc:   Delaney Meigs, M.D.  Fax: [phone]

## 2010-08-29 NOTE — Op Note (Signed)
[facility]  Patient:    [name], [name]                       MRN: [id number] Proc. Date: 10/03/99 Adm. Date:  [id number] Disc. Date: [id number] Attending:  Alyson Locket                           Operative Report  PREOPERATIVE DIAGNOSIS:  Eaton-Lambert syndrome with need for chronic IV therapy.  POSTOPERATIVE DIAGNOSIS:  Eaton-Lambert syndrome with need for chronic IV therapy.  PROCEDURE:  Placement of left subclavian Port-A-Cath.  SURGEON:  Larina Earthly, M.D.  ASSISTANT:  Nurse.  ANESTHESIA:  Lidocaine 0.5% with epinephrine local and IV sedation.  COMPLICATIONS:  None.  DISPOSITION:  To recovery room - stable.  PROCEDURE IN DETAIL:  The patient was taken to the operating room, placed in supine position, where the area the left and right neck and chest were prepped and draped in usual sterile fashion.  Using local anesthesia with the patient in the supine position, the left subclavian was entered and using a Seldinger technique, a guidewire was passed down to the level of the right atrium.  An incision was made at this level and an inferiorly based pocket was created for the placement of the port.  The Bard port was passed into the pocket and the port was secured in the pocket with 3-0 Prolene sutures.  The dilator and peel-away sheath was then passed over the guidewire.  The catheter was cut to the appropriate length and the dilator and guidewire were removed and the catheter was passed down the tunnel and the peel-away sheath was removed.  The port flushed easily and was locked with heparinized saline.  The incision was then closed with 3-0 Vicryl in the subcutaneous tissue and the skin was closed with 4-0 subcuticular Vicryl stitch.  Sterile dressing was applied and the patient was taken to the recovery room in stable condition.  Chest x-ray was obtained showing the tip in the superior vena cava/right atrial junction and no  pneumothorax.  Patient was discharged home after recovery. DD:  10/03/99 TD:  10/06/99 Job: [id number]

## 2010-08-29 NOTE — Op Note (Signed)
NAME:  [name], [name]                          ACCOUNT NO.:  [id number]   MEDICAL RECORD NO.:  [id number]                   PATIENT TYPE:  OIB   LOCATION:  2859                                 FACILITY:  [facility]   PHYSICIAN:  Di Kindle. Edilia Bo, M.D.        DATE OF BIRTH:  Mar 16, 1961   DATE OF PROCEDURE:  04/21/2002  DATE OF DISCHARGE:  04/21/2002                                 OPERATIVE REPORT   PREOPERATIVE DIAGNOSIS:  [name].   POSTOPERATIVE DIAGNOSIS:  [name].   PROCEDURE:  1. Ultrasound of left internal jugular vein.  2. Placement of left internal jugular vein single lumen Portacath.  3. Removal of right internal jugular vein Diatek catheter.   SURGEON:  Di Kindle. Edilia Bo, M.D.   ANESTHESIA:  Local with sedation.   TECHNIQUE:  The patient was taken to the operating room and sedated by  anesthesia.  The left internal jugular vein was identified with ultrasound  scanner and marked.  The neck and upper chest were then prepped and draped  in usual sterile fashion.  The right IJ Diatek catheter was prepped into the  field.  After the skin was anesthetized with 1% lidocaine, the left internal  jugular vein was cannulated and a guidewire introduced into the superior  vena cava under fluoroscopic control.  The site for the port was then  selected and after the skin was anesthetized, a transverse incision was made  and a pocket was created down to the fascia.  Next, the catheter was brought  through the tunnel and then the dilator and peel-away sheath were passed  over the wire and the wire and dilator were removed.  The catheter was  passed through the peel-away sheath and positioned in the right atrium.  It  was then cut to the appropriate length and the single-lumen port was  attached and secured.  This was then secured at the chest wall using two 4-0  nylon sutures.  Next, the wound was irrigated and the deep layer was closed  with  interrupted 3-0 Vicryl.  The skin was closed with a 4-0 subcuticular  stitch.  The IJ cannulation site was closed with a 4-0 subcuticular stitch.  The catheter was cannulated and blood withdrew easily.  It was then flushed  with heparinized saline and filled with concentrated heparin at 100  units/cc.  Next, a sterile dressing was applied and then the Diatek catheter  was removed under local anesthetic and pressure held for hemostasis.  There  was no change in the position of the new catheter after the old catheter was  removed.  Sterile dressing was applied.   The patient tolerated the procedure well and was transferred to the recovery  room in satisfactory condition.  All needle and sponge counts were correct.  Di Kindle. Edilia Bo, M.D.    CSD/MEDQ  D:  04/21/2002  T:  04/21/2002  Job:  [id number]

## 2010-08-29 NOTE — Op Note (Signed)
   NAMEATLEE, KLUTH                          ACCOUNT NO.:  [id number]   MEDICAL RECORD NO.:  [id number]                   PATIENT TYPE:  OIB   LOCATION:  2860                                 FACILITY:  [facility]   PHYSICIAN:  Larina Earthly, M.D.                 DATE OF BIRTH:  25-Aug-1960   DATE OF PROCEDURE:  03/06/2002  DATE OF DISCHARGE:                                 OPERATIVE REPORT   PREOPERATIVE DIAGNOSIS:  Neuromuscular disease.   POSTOPERATIVE DIAGNOSIS:  Neuromuscular disease.   PROCEDURES:  1. Placement of new right internal jugular Diatek catheter.  2. Removal of right internal jugular Port-A-Cath.   SURGEON:  Larina Earthly, M.D.   ANESTHESIA:  MAC.   COMPLICATIONS:  None.   DISPOSITION:  To recovery stable.   DESCRIPTION OF PROCEDURE:  The patient was taken to the operating room and  placed in the supine position, where the area of the right and left neck and  chest prepped and draped in the usual sterile fashion.  The patient had  preoperative imaging with ultrasound showing patent internal jugular veins  bilaterally.  The patient was placed in Trendelenburg position.  Using local  anesthesia and a finder needle, the right internal jugular vein was  identified.  Next using Seldinger technique, the guidewire was passed down  to the level of the right atrium.  A separate incision was made over the  lateral level of the clavicle and a tunnel was created from this site to the  guidewire entry site.  A 28 cm Diatek catheter was passed over the dilator  and peel-away sheath and then the dilator and peel-away sheath were removed.  The catheter was positioned to the level of the right atrium.  The catheter  was then withdrawn through the prior-created tunnel.  The dual-lumen  catheter was advanced, ports were attached to the catheter, and both lumens  flushed and aspirated easily and were locked with 1000 units/cc heparin.  The catheter was secured to the skin with a  3-0 nylon stitch and the entry  site was closed with 4-0 subcuticular Vicryl stitch.  A sterile dressing was  applied.  Next an incision was made through the prior incision used to place  the Port-A-Cath. The Port-A-Cath was removed in its entirety.  The wound was  irrigated with saline, hemostased with electrocautery.  The wounds were  closed with 3-0 Vicryl in the subcutaneous and subcuticular tissue, Benzoin  and Steri-Strips were applied.                                               Larina Earthly, M.D.    TFE/MEDQ  D:  03/06/2002  T:  03/06/2002  Job:  [id number]

## 2010-08-29 NOTE — H&P (Signed)
NAME:  [name], [name]                          ACCOUNT NO.:  [id number]   MEDICAL RECORD NO.:  [id number]                   PATIENT TYPE:  INP   LOCATION:                                       FACILITY:  [facility]   PHYSICIAN:  Jaclyn Prime. Lucas Mallow, M.D.                DATE OF BIRTH:  01/16/1961   DATE OF ADMISSION:  06/01/2002  DATE OF DISCHARGE:                                HISTORY & PHYSICAL   CHIEF COMPLAINT:  Chest pain.   HISTORY OF PRESENT ILLNESS:  This 50 year old patient of Dr. Anne Hahn, who is  treated for Eaton-Nicholson syndrome and neuromuscular weakness, complained of  left squeezing chest discomfort, lasting for approximately an hour, during  his immunoglobulin infusion today.  The pain was relieved with two  nitroglycerin over a fairly long interval.  An EKG taken shortly after the  chest pain is normal.   He relates a history of occasional short sharp chest pain occurring at  random intervals over the last week or so.  He has never had a similar  discomfort previously.   PAST MEDICAL HISTORY:  Includes an operation on the knee while he was in  high school.  He has been taking immunoglobulin injections for Eaton-Nicholson  syndrome for approximately four years.  He says that he has very notable  improvement in his strength within a day or so after the infusions, and then  marked reduction in strength by about the fourth to sixth day out.  He is on  weekly infusions.   CURRENT MEDICATIONS:  1. Prevacid 15 mg daily.  2. CellCept 500 mg daily.  3. Mestinon 30 mg three times a day.  4. Diaminopyridine 10 mg four times a day.  This is an experimental     medication from [facility].  5. He also rarely takes Ambien for sleep.   FAMILY HISTORY:  His mother is living and well.  His father died of  cirrhosis.  There is no known family history of coronary heart disease or  sudden death.   SOCIAL HISTORY:  He is retired on disability as an Lobbyist.  His  mother is with him  today.   REVIEW OF SYSTEMS:  CONSTITUTIONAL:  He denies fevers, chills, or sweats.  There is no claudication or edema.  EYES:  He has impaired vision and wears  corrected lenses.  ENT:  He has some loss of hearing.  CARDIOVASCULAR:  See  history of present illness.  He has no known prior cardiac problem.  RESPIRATORY:  No recent cough or wheezing.  He does not smoke.  He does have  dyspnea with exertion, which has been related to his neuromuscular syndrome.  GI:  No nausea, vomiting, or change in bowel habits.  GU:  No dysuria or  pyuria.  MUSCULOSKELETAL:  He has considerable weakness as described above.  SKIN:  He has a rash  on his face which was treated by Dr. Margo Aye.  NEUROLOGIC:  Eaton-Nicholson appears to actually date to 1993, according to old records in  the chart.  PSYCHIATRIC:  No overt depression or hallucinations.  ENDOCRINE:  No known nodule disease or thyroid disease.  He notes no swelling in the  axillary ___________ lymphatic.   ALLERGIES:  No known drug allergies.   PHYSICAL EXAMINATION:  VITAL SIGNS:  Blood pressure 120/60, pulse 80 and  regular, respirations 18 and unlabored.  GENERAL:  He is a somewhat overweight, chronically ill-appearing, early  middle-aged man, now with chest pain, relieved.  He is oriented to person,  place, and time.  His mood and affect are appropriate.  HEENT:  His conjunctivae and lids reveal no xanthelasma, icterus, or arcus  senilis.  He does appear to have some lid droop.  He has his own teeth which  are in fair repair.  The oral mucosa reveals no pallor, no cyanosis.  NECK:  Supple and symmetrical.  The trachea is midline and mobile.  There is  no palpable enlargement of the thyroid gland, and no cervical node.  No JVD  or carotid bruit.  RESPIRATORY:  His respiratory effort is normal.  His lungs are clear to  auscultation and percussion.  BACK:  Not easy to examination because of weakness.  NEUROLOGIC:  His gait is not tested.  He could  not undergo a stress test.  Muscle strength and tone are reduced.  CARDIAC:  Apical impulses __________ within the left anterior axillary line.  The heart rhythm is regular.  It is normal.  There is no gallop, click, or  murmur.  The digits and nails reveal no clubbing or cyanosis.  SKIN:  The subcutaneous tissue revealed no stasis dermatitis or ulcer.  There is a facial rash.  ABDOMEN:  Mildly obese and nontender without palpable enlargement of the  liver or spleen.  The abdominal aorta is not palpable, and there is no  bruit.  The femoral arteries are without bruits.  EXTREMITIES:  The pedal pulses are intact, posterior tibial and dorsalis  pedis 2+ bilaterally.  The legs reveal no edema or varicosity.   An EKG taken after chest pain is within normal limits and unchanged from one  taken in 11/03.  His initial enzymes, CK with MB, and troponin I are all  negative.   I have discussed with [name] there is no truly reliable test at this  point to rule out significant coronary artery disease, other than cardiac  catheterization.  At his age, if he has coronary narrowing, it is likely to  be single vessel, which would considerably reduce the sensitivity of any of  the noninvasive tests.  Given that he is going to be continuing to have  intravenous infusions, and that this chest pain occurred in that setting,  and that it is reasonable to expect it to recur in that setting, cardiac  catheterization is really the only test capable of excluding coronary artery  disease at a sufficient level of precision to resolve his questions for the  future.  He will be kept overnight, serial enzymes obtained, and cardiac  catheterization carried out tomorrow if that is feasible.   Mentioned above is that Dr. Anne Hahn is his neurologist, and we will send a  copy of this to him.  Jaclyn Prime. Lucas Mallow, M.D.   DDG/MEDQ  D:  06/01/2002  T:  06/01/2002  Job:   [id number]   cc:   Dr. Anne Hahn, neurologist

## 2011-01-19 LAB — COMPREHENSIVE METABOLIC PANEL
ALT: 35
AST: 33
Alkaline Phosphatase: 60
CO2: 26
Calcium: 9.1
Chloride: 102
GFR calc Af Amer: >60
GFR calc non Af Amer: >60
Potassium: 3.7
Sodium: 137
Total Bilirubin: 0.6

## 2011-01-19 LAB — CBC
RBC: 4.81
WBC: 6.7

## 2011-01-19 LAB — DIFFERENTIAL
Eosinophils Absolute: 0.1 — ABNORMAL LOW
Eosinophils Relative: 1
Lymphs Abs: 1.4

## 2011-01-20 LAB — COMPREHENSIVE METABOLIC PANEL
AST: 33
Albumin: 3.8
Albumin: 3.7
BUN: 8
Calcium: 9.1
Chloride: 105
Creatinine, Ser: 0.94
Creatinine, Ser: 0.94
GFR calc Af Amer: >60
Total Bilirubin: 0.8
Total Protein: 6.4

## 2011-01-20 LAB — DIFFERENTIAL
Basophils Absolute: 0
Eosinophils Relative: 0
Lymphocytes Relative: 21
Lymphocytes Relative: 23
Lymphs Abs: 1.5
Monocytes Absolute: 0.7
Monocytes Absolute: 0.8 — ABNORMAL HIGH
Monocytes Relative: 10
Monocytes Relative: 12 — ABNORMAL HIGH
Neutro Abs: 4.2
Neutrophils Relative %: 64

## 2011-01-20 LAB — CBC
HCT: 40.9
MCV: 88
MCV: 88
Platelets: 187
Platelets: 177
RDW: 13.4
WBC: 7

## 2011-01-26 LAB — CBC
Hemoglobin: 15.1
MCHC: 34.8
RBC: 4.89
WBC: 6.1

## 2011-01-26 LAB — DIFFERENTIAL
Basophils Relative: 0
Lymphs Abs: 0.6 — ABNORMAL LOW
Monocytes Absolute: 0.1 — ABNORMAL LOW
Monocytes Relative: 2 — ABNORMAL LOW
Neutro Abs: 5.3

## 2011-02-23 ENCOUNTER — Other Ambulatory Visit: Payer: Self-pay

## 2011-02-23 ENCOUNTER — Encounter: Payer: Self-pay | Admitting: Emergency Medicine

## 2011-02-23 ENCOUNTER — Emergency Department (HOSPITAL_COMMUNITY)
Admission: EM | Admit: 2011-02-23 | Discharge: 2011-02-24 | Disposition: A | Discharge status: Home / Self Care | Payer: Medicare Other | Attending: Emergency Medicine | Admitting: Emergency Medicine

## 2011-02-23 ENCOUNTER — Emergency Department (HOSPITAL_COMMUNITY): Payer: Medicare Other

## 2011-02-23 DIAGNOSIS — I10 Essential (primary) hypertension: Secondary | ICD-10-CM | POA: Insufficient documentation

## 2011-02-23 DIAGNOSIS — R079 Chest pain, unspecified: Secondary | ICD-10-CM | POA: Insufficient documentation

## 2011-02-23 DIAGNOSIS — F419 Anxiety disorder, unspecified: Secondary | ICD-10-CM

## 2011-02-23 DIAGNOSIS — F411 Generalized anxiety disorder: Secondary | ICD-10-CM | POA: Insufficient documentation

## 2011-02-23 HISTORY — DX: Depression, unspecified: F32.A

## 2011-02-23 HISTORY — DX: Dorsalgia, unspecified: M54.9

## 2011-02-23 HISTORY — DX: Major depressive disorder, single episode, unspecified: F32.9

## 2011-02-23 LAB — CBC
Hemoglobin: 14.7 g/dL (ref 13.0–17.0)
MCHC: 35.6 g/dL (ref 30.0–36.0)
RDW: 13.2 % (ref 11.5–15.5)

## 2011-02-23 LAB — DIFFERENTIAL
Basophils Absolute: 0 10*3/uL (ref 0.0–0.1)
Basophils Relative: 0 % (ref 0–1)
Neutro Abs: 7.8 10*3/uL — ABNORMAL HIGH (ref 1.7–7.7)
Neutrophils Relative %: 81 % — ABNORMAL HIGH (ref 43–77)

## 2011-02-23 LAB — BASIC METABOLIC PANEL
Chloride: 103 mEq/L (ref 96–112)
GFR calc Af Amer: >90 mL/min (ref >90)
Potassium: 4.2 mEq/L (ref 3.5–5.1)
Sodium: 138 mEq/L (ref 135–145)

## 2011-02-23 LAB — TROPONIN I: Troponin I: <0.3 ng/mL (ref <0.30)

## 2011-02-23 MED ORDER — SODIUM CHLORIDE 0.9 % IV BOLUS (SEPSIS)
1000.0000 mL | Freq: Once | INTRAVENOUS | Status: AC
  Administered 2011-02-23: 1000 mL via INTRAVENOUS

## 2011-02-23 MED ORDER — METOPROLOL TARTRATE 25 MG PO TABS
25.0000 mg | ORAL_TABLET | Freq: Once | ORAL | Status: AC
  Administered 2011-02-23: 25 mg via ORAL
  Filled 2011-02-23: qty 1

## 2011-02-23 MED ORDER — PROPRANOLOL HCL 40 MG PO TABS: 40.0000 mg | ORAL_TABLET | Freq: Two times a day (BID) | ORAL | Status: DC

## 2011-02-23 MED ORDER — LORAZEPAM 1 MG PO TABS
1.0000 mg | ORAL_TABLET | Freq: Once | ORAL | Status: AC
  Administered 2011-02-23: 1 mg via ORAL
  Filled 2011-02-23: qty 1

## 2011-02-23 MED ORDER — LORAZEPAM 1 MG PO TABS: 1.0000 mg | ORAL_TABLET | Freq: Three times a day (TID) | ORAL | Status: AC | PRN

## 2011-02-23 NOTE — ED Notes (Signed)
Pt to ED with panic attack while at work. Pt states was upset and got worked up by a co-worker and had a severe panic attack. Pt states did feel as if he was going to pass out but states symptoms has resolved. Pt in gown. Pt place don monitor. Pt awaits eval.

## 2011-02-23 NOTE — ED Notes (Signed)
Pt given discharge instructions and verbalizes understanding  

## 2011-02-23 NOTE — ED Notes (Signed)
Pt resting on stretcher. Pt denies any complaints. Pt cont to await further dispo. Bed locked in low position, SR up x 2, call bell in reach. Will cont to monitor. 

## 2011-02-23 NOTE — ED Notes (Signed)
Pt resting on stretcher. Pt cont to await further dispo. Pt denies any needs or concerns at this time. Family remains at bedside. Will cont to monitor

## 2011-02-23 NOTE — ED Notes (Signed)
Blood drawn from PIV. 10cc wasted. Dark green, light green, purple tube obtained and sent to lad. Pt tolerated well

## 2011-02-23 NOTE — ED Notes (Signed)
Pt medicated as ordered, SEE MAR 

## 2011-02-23 NOTE — ED Provider Notes (Signed)
History     CSN: [id number] Arrival date & time: 02/23/2011  7:19 PM   First MD Initiated Contact with Patient 02/23/11 2047      Chief Complaint  Patient presents with  . Anxiety  . Hypertension    (Consider location/radiation/quality/duration/timing/severity/associated sxs/prior treatment) HPI Patient presents to the emergency room after having an episode of feeling very anxious and hot at work. Patient states he got very upset after having a disagreement with a Radio broadcast assistant. Patient states he felt as if he was going to pass out. One of the other people that he works with took his  pulse and could not feel it. They had him sit down and called 911. The patient states he does have history of palpitations. He also has history of severe hypokalemia. Patient states he seen his doctor recently and was told he had prolonged QT syndrome and hyperthyroidism but he has not had either of those treated yet. He supposed to be seeing a cardiologist. Patient notes he has hypertension but has not been taking any medications for it because it disagreed with him. Past Medical History  Diagnosis Date  . Depression   . Back pain     Past Surgical History  Procedure Date  . Knee surgery     History reviewed. No pertinent family history.  History  Substance Use Topics  . Smoking status: Not on file  . Smokeless tobacco: Not on file  . Alcohol Use: No      Review of Systems  All other systems reviewed and are negative.    Allergies  Review of patient's allergies indicates no known allergies.  Home Medications   Current Outpatient Rx  Name Route Sig Dispense Refill  . CITALOPRAM HYDROBROMIDE 20 MG PO TABS Oral Take 20 mg by mouth daily.      Marland Kitchen DIAMINOPYRIDINE POWD Does not apply 3 tablets by Does not apply route. MEDICATION IS 10MG  TABLETS....IT IS NOT POWDER....TAKES 3 TABLETS  IN THE MORNING FOR A 30MG  DOSAGE, 2 TABLETS MIDDAY FOR A  20MG  DOSAGE  AND 3 TABLETS AT NIGHT FOR A 30MG  DOSAGE.      Marland Kitchen OMEGA-3 FATTY ACIDS 1000 MG PO CAPS Oral Take 1 g by mouth 3 (three) times daily.      Marland Kitchen HYDROCODONE-ACETAMINOPHEN 5-500 MG PO TABS Oral Take 1 tablet by mouth every 6 (six) hours as needed. PAIN.     Marland Kitchen ONE-A-DAY MENS 50+ ADVANTAGE PO Oral Take 1 tablet by mouth daily.      Marland Kitchen PYRIDOSTIGMINE BROMIDE 60 MG PO TABS Oral Take 30 mg by mouth 3 (three) times daily.      Marland Kitchen ZOLPIDEM TARTRATE 10 MG PO TABS Oral Take 10 mg by mouth at bedtime.      Marland Kitchen LORAZEPAM 1 MG PO TABS Oral Take 1 tablet (1 mg total) by mouth every 8 (eight) hours as needed for anxiety. 12 tablet 0  . PROPRANOLOL HCL 40 MG PO TABS Oral Take 1 tablet (40 mg total) by mouth 2 (two) times daily. 30 tablet 1    BP 199/86  Pulse 107  Temp(Src) 98.3 F (36.8 C) (Oral)  Resp 22  SpO2 100%  Physical Exam  Nursing note and vitals reviewed. Constitutional: He appears well-developed and well-nourished. No distress.       hypertension  HENT:  Head: Normocephalic and atraumatic.  Right Ear: External ear normal.  Left Ear: External ear normal.  Eyes: Conjunctivae are normal. Right eye exhibits no discharge. Left eye exhibits  no discharge. No scleral icterus.  Neck: Neck supple. No tracheal deviation present.  Cardiovascular: Normal rate, regular rhythm and intact distal pulses.   Pulmonary/Chest: Effort normal and breath sounds normal. No stridor. No respiratory distress. He has no wheezes. He has no rales.  Abdominal: Soft. Bowel sounds are normal. He exhibits no distension. There is no tenderness. There is no rebound and no guarding.  Musculoskeletal: He exhibits no edema and no tenderness.  Neurological: He is alert. He has normal strength. No sensory deficit. Cranial nerve deficit:  no gross defecits noted. He exhibits normal muscle tone. He displays no seizure activity. Coordination normal.  Skin: Skin is warm and dry. No rash noted.  Psychiatric: He has a normal mood and affect.    ED Course  Procedures (including  critical care time)  Date: 02/23/2011  Rate: 92  Rhythm: normal sinus rhythm  QRS Axis: normal  Intervals: normal  ST/T Wave abnormalities: normal  Conduction Disutrbances:none  Narrative Interpretation:   Old EKG Reviewed: unchanged   Labs Reviewed  DIFFERENTIAL - Abnormal; Notable for the following:    Neutrophils Relative 81 (*)    Neutro Abs 7.8 (*)    All other components within normal limits  BASIC METABOLIC PANEL - Abnormal; Notable for the following:    Glucose, Bld 137 (*)    All other components within normal limits  CBC  TROPONIN I   Dg Chest 2 View  02/23/2011  *RADIOLOGY REPORT*  Clinical Data: 50 year old male with chest pain.  Hypertension.  CHEST - 2 VIEW  Comparison: Chest CTA 06/27/2005 and earlier.  Findings: Stable left chest Port-A-Cath.  Stable lung volumes. Cardiac size and mediastinal contours are within normal limits. Visualized tracheal air column is within normal limits.  No pneumothorax, pulmonary edema, pleural effusion or confluent pulmonary opacity.  Mild loss of height in mid thoracic vertebral bodies appears stable. No acute osseous abnormality identified.  IMPRESSION: No acute cardiopulmonary abnormality.  Original Report Authenticated By: Harley Hallmark, M.D.     1. Hypertension   2. Anxiety       MDM  I suspect the patient had a panic attack this evening. There's been no evidence of dysrhythmia while he has been in the emergency department. His blood count and electrolytes have been normal. The patient has had hypertension while he is here however. I will discuss with him about starting a beta blocker medication considering his persistent hypertension and possible thyroid disease        Celene Kras, MD 02/23/11 2329

## 2011-02-25 ENCOUNTER — Encounter: Payer: Self-pay | Admitting: Cardiology

## 2011-02-26 ENCOUNTER — Ambulatory Visit: Payer: Self-pay | Admitting: Cardiology

## 2011-02-27 ENCOUNTER — Telehealth: Payer: Self-pay | Admitting: Cardiology

## 2011-02-27 NOTE — Telephone Encounter (Addendum)
ROI faxed to PT  02/27/11/km  Pt faxed ROI back, ROi was then faxed to [facility] @ [phone]   02/27/11/km  ROI faxed to [facility] Medical Records x2 @ [phone] to Obtain Picture Of EKG done 02/04/11.Marland KitchenMarland KitchenFaxed to [facility]  03/03/11/km  12 Lead Tracing received gave to [facility] 03/03/11/km

## 2011-03-02 ENCOUNTER — Encounter: Payer: Self-pay | Admitting: Cardiology

## 2011-03-16 ENCOUNTER — Encounter: Payer: Self-pay | Admitting: Cardiology

## 2011-03-16 ENCOUNTER — Ambulatory Visit (INDEPENDENT_AMBULATORY_CARE_PROVIDER_SITE_OTHER): Payer: Medicare Other | Admitting: Cardiology

## 2011-03-16 VITALS — BP 154/80 | HR 67 | Ht 70.0 in | Wt 211.0 lb

## 2011-03-16 DIAGNOSIS — I4581 Long QT syndrome: Secondary | ICD-10-CM | POA: Insufficient documentation

## 2011-03-16 DIAGNOSIS — R55 Syncope and collapse: Secondary | ICD-10-CM

## 2011-03-16 DIAGNOSIS — F329 Major depressive disorder, single episode, unspecified: Secondary | ICD-10-CM | POA: Insufficient documentation

## 2011-03-16 DIAGNOSIS — Q245 Malformation of coronary vessels: Secondary | ICD-10-CM | POA: Insufficient documentation

## 2011-03-16 DIAGNOSIS — E0590 Thyrotoxicosis, unspecified without thyrotoxic crisis or storm: Secondary | ICD-10-CM | POA: Insufficient documentation

## 2011-03-16 DIAGNOSIS — G7080 Lambert-Eaton syndrome, unspecified: Secondary | ICD-10-CM | POA: Insufficient documentation

## 2011-03-16 DIAGNOSIS — F32A Depression, unspecified: Secondary | ICD-10-CM | POA: Insufficient documentation

## 2011-03-16 DIAGNOSIS — R072 Precordial pain: Secondary | ICD-10-CM

## 2011-03-16 DIAGNOSIS — M549 Dorsalgia, unspecified: Secondary | ICD-10-CM | POA: Insufficient documentation

## 2011-03-16 DIAGNOSIS — R011 Cardiac murmur, unspecified: Secondary | ICD-10-CM

## 2011-03-16 NOTE — Patient Instructions (Addendum)
Your physician has requested that you have an echocardiogram. Echocardiography is a painless test that uses sound waves to create images of your heart. It provides your doctor with information about the size and shape of your heart and how well your heart's chambers and valves are working. This procedure takes approximately one hour. There are no restrictions for this procedure.  Your physician recommends that you schedule a follow-up appointment in: approx 4-5 weeks

## 2011-03-16 NOTE — Assessment & Plan Note (Signed)
The patient has a systolic murmur today.  I doubt that he has any significant valvular disease.  Two-dimensional echo is to be done to help evaluate valvular function and left ventricular function.  I will then see him for followup.

## 2011-03-16 NOTE — Progress Notes (Signed)
HPI  The patient is seen as a new patient today to evaluate his QT interval and to help evaluate an episode of chest fullness and presyncope.  The patient has a history of [name].  He's been followed by neurology in [place] and also by neurology at [facility].  He is on medications for this form of myasthenia.  During a recent evaluation at [facility] he had an EKG.  There was question on the computer reading of a prolonged QT interval.  Prior EKGs had shown normal QT interval.  He was referred for further evaluation.  In addition he has had a few episodes recently that may be panic attacks.  He begins to feel a full sensation in his chest and head.  He feels panicky.  He was taken in the emergency room with this on one occasion.  Initially it was thought that his pulse was not felt well but blood pressure done rapidly actually showed that he was hypertensive.  He stabilized with no significant treatment.  Allergies  Allergen Reactions  . Rituximab     Current Outpatient Prescriptions  Medication Sig Dispense Refill  . Diaminopyridine POWD MEDICATION IS 10MG  TABLETS....IT IS NOT POWDER....TAKES 3 TABLETS  IN THE MORNING FOR A 30MG  DOSAGE, 2 TABLETS MIDDAY FOR A  20MG  DOSAGE  AND 3 TABLETS AT NIGHT FOR A 30MG  DOSAGE.      . fish oil-omega-3 fatty acids 1000 MG capsule Take 1 g by mouth 3 (three) times daily.        Marland Kitchen HYDROcodone-acetaminophen (VICODIN) 5-500 MG per tablet Take 1 tablet by mouth every 6 (six) hours as needed. PAIN.       . mirtazapine (REMERON) 15 MG tablet Take 15 mg by mouth at bedtime.        . Multiple Vitamins-Minerals (ONE-A-DAY MENS 50+ ADVANTAGE PO) Take 1 tablet by mouth daily.        . propranolol (INDERAL) 40 MG tablet Take 1 tablet (40 mg total) by mouth 2 (two) times daily.  30 tablet  1  . pyridostigmine (MESTINON) 60 MG tablet Take 30 mg by mouth 3 (three) times daily.        Marland Kitchen zolpidem (AMBIEN) 10 MG tablet Take 10 mg by mouth at bedtime.          History     Social History  . Marital Status: Married    Spouse Name: N/A    Number of Children: 2  . Years of Education: N/A   Occupational History  . police dispatcher    Social History Main Topics  . Smoking status: Never Smoker   . Smokeless tobacco: Not on file  . Alcohol Use: No  . Drug Use: No  . Sexually Active: Yes   Other Topics Concern  . Not on file   Social History Narrative  . No narrative on file    Family History  Problem Relation Age of Onset  . Hypertension Mother   . Cirrhosis Father   . Cirrhosis Maternal Grandmother     non alcoholic  . Heart disease    . Stroke    . Cancer      Past Medical History  Diagnosis Date  . Depression   . Back pain   . Lambert-Eaton myasthenic syndrome     Followed at [facility]. and Dr. Lesia Sago  . QT interval     Question prolongation QT interval, October, 2012, review of EKG dated February 04, 2011, QT interval was not prolonged  .  Hyperthyroidism     November, 201 to  . Pre-syncope     February 23, 2011    Past Surgical History  Procedure Date  . Knee surgery     ROS    Patient denies fever, chills, headache, sweats patch, change in vision, change in hearing, cough, nausea vomiting, urinary symptoms.  All other systems are reviewed and are negative.  PHYSICAL EXAM   Patient is stable today.  He is oriented to person time and place.  Affect is normal.  He is here with his wife.  There is no jugular venous distention.  Lungs are clear.  Respiratory effort is nonlabored.  Cardiac exam reveals an S1-S2.  No clicks or significant murmurs.  Abdomen is soft.  There is no peripheral edema.  There is no musculoskeletal deformities.  No skin rashes.  His skin is slightly moist at the time of evaluation.  Filed Vitals:   03/16/11 1512  BP: 154/80  Pulse: 67  Height: 5\' 10"  (1.778 m)  Weight: 211 lb (95.709 kg)    EKG  EKG is done today and reviewed by me.  The EKG is normal.  The QT interval is normal.  ASSESSMENT &  PLAN

## 2011-03-16 NOTE — Assessment & Plan Note (Signed)
This problem is followed carefully by neurology in [place] and at [place].  He is on special medications for this.

## 2011-03-16 NOTE — Assessment & Plan Note (Addendum)
The patient describes an episode of feeling some panic along with a rushing sensation in his chest and head.  This was associated with hypertension.  It resolved spontaneously with time.  He was evaluated in the ER and had no obvious findings.  There is no proof of any cardiac arrhythmia or other cardiac problem.  He does have a soft systolic murmur.  I doubt that he has significant valvular disease.  Two-dimensional echo was done to review complete.   I considered whether he needs some type of exercise testing.  With his muscular problem it would not be appropriate for him to walk on the treadmill.  I will consider other approaches later.  I also decided not to proceed with any type of Holter monitoring at this time.

## 2011-03-16 NOTE — Assessment & Plan Note (Signed)
There is question of whether or not the patient has had QT prolongation.  We have received the EKG copy done at [facility] on over the 24th, 2012.  I have reviewed this carefully and also reviewed it with Dr. Graciela Husbands of electrophysiology.  It appears that the computer made in an accurate measurement of the QT interval on this tracing.  The QT interval was not prolonged.  It is not prolonged on any other tracing either.  At this point there is no evidence that he has any type of QT prolongation.  No further workup is needed at this question.

## 2011-03-16 NOTE — Assessment & Plan Note (Signed)
There was question recently that he might be hyperthyroid.  It was my understanding that this was to have been evaluated before today's visit with me.  I realize that this never came up and the discussion.  I I will follow through on questioning about this when I see him back.

## 2011-03-16 NOTE — Assessment & Plan Note (Signed)
The patient had undergone catheterization in 2004.  He had no atherosclerotic disease that was obvious.  He did have apparent takeoff of both the right and left coronaries from the anterior aortic cusp.  Relative to his overall symptoms at this time it is good to know that his coronaries revealed no significant atherosclerosis.  It does not seem that his coronary anatomy will need to be assessed further at this time.  It was also noted that he had no renal artery stenosis.

## 2011-03-18 ENCOUNTER — Ambulatory Visit (HOSPITAL_COMMUNITY): Payer: Medicare Other | Attending: Cardiovascular Disease | Admitting: Radiology

## 2011-03-18 DIAGNOSIS — R072 Precordial pain: Secondary | ICD-10-CM

## 2011-03-23 ENCOUNTER — Encounter: Payer: Self-pay | Admitting: Cardiology

## 2011-03-23 DIAGNOSIS — IMO0002 Reserved for concepts with insufficient information to code with codable children: Secondary | ICD-10-CM | POA: Insufficient documentation

## 2011-03-23 DIAGNOSIS — R9430 Abnormal result of cardiovascular function study, unspecified: Secondary | ICD-10-CM | POA: Insufficient documentation

## 2011-03-31 ENCOUNTER — Other Ambulatory Visit (HOSPITAL_COMMUNITY): Payer: Self-pay | Admitting: Endocrinology

## 2011-03-31 DIAGNOSIS — E0590 Thyrotoxicosis, unspecified without thyrotoxic crisis or storm: Secondary | ICD-10-CM

## 2011-04-15 ENCOUNTER — Encounter (HOSPITAL_COMMUNITY)
Admission: RE | Admit: 2011-04-15 | Discharge: 2011-04-15 | Disposition: A | Discharge status: Home / Self Care | Payer: Medicare Other | Source: Ambulatory Visit | Attending: Endocrinology | Admitting: Endocrinology

## 2011-04-15 DIAGNOSIS — E0590 Thyrotoxicosis, unspecified without thyrotoxic crisis or storm: Secondary | ICD-10-CM | POA: Insufficient documentation

## 2011-04-16 ENCOUNTER — Ambulatory Visit (HOSPITAL_COMMUNITY)
Admission: RE | Admit: 2011-04-16 | Discharge: 2011-04-16 | Disposition: A | Discharge status: Home / Self Care | Payer: Medicare Other | Source: Ambulatory Visit | Attending: Endocrinology | Admitting: Endocrinology

## 2011-04-16 DIAGNOSIS — E0590 Thyrotoxicosis, unspecified without thyrotoxic crisis or storm: Secondary | ICD-10-CM | POA: Diagnosis not present

## 2011-04-16 MED ORDER — SODIUM IODIDE I 131 CAPSULE
9.3000 | Freq: Once | INTRAVENOUS | Status: AC | PRN
  Administered 2011-04-15: 9.3 via ORAL

## 2011-04-16 MED ORDER — SODIUM PERTECHNETATE TC 99M INJECTION
10.5000 | Freq: Once | INTRAVENOUS | Status: AC | PRN
  Administered 2011-04-16: 11 via INTRAVENOUS

## 2011-04-22 ENCOUNTER — Other Ambulatory Visit (HOSPITAL_COMMUNITY): Payer: Self-pay | Admitting: Endocrinology

## 2011-04-22 DIAGNOSIS — E0500 Thyrotoxicosis with diffuse goiter without thyrotoxic crisis or storm: Secondary | ICD-10-CM

## 2011-04-27 DIAGNOSIS — Z23 Encounter for immunization: Secondary | ICD-10-CM | POA: Diagnosis not present

## 2011-04-29 ENCOUNTER — Encounter (HOSPITAL_COMMUNITY)
Admission: RE | Admit: 2011-04-29 | Discharge: 2011-04-29 | Disposition: A | Discharge status: Home / Self Care | Payer: Medicare Other | Source: Ambulatory Visit | Attending: Endocrinology | Admitting: Endocrinology

## 2011-04-29 DIAGNOSIS — E0590 Thyrotoxicosis, unspecified without thyrotoxic crisis or storm: Secondary | ICD-10-CM | POA: Diagnosis not present

## 2011-04-29 DIAGNOSIS — E0500 Thyrotoxicosis with diffuse goiter without thyrotoxic crisis or storm: Secondary | ICD-10-CM

## 2011-04-29 MED ORDER — SODIUM IODIDE I 131 CAPSULE
16.5000 | Freq: Once | INTRAVENOUS | Status: AC | PRN
Start: 2011-04-29 — End: 2011-04-29
  Administered 2011-04-29: 16.5 via ORAL

## 2011-05-04 DIAGNOSIS — E0500 Thyrotoxicosis with diffuse goiter without thyrotoxic crisis or storm: Secondary | ICD-10-CM | POA: Diagnosis not present

## 2011-05-11 DIAGNOSIS — R072 Precordial pain: Secondary | ICD-10-CM | POA: Diagnosis not present

## 2011-05-13 ENCOUNTER — Encounter: Payer: Self-pay | Admitting: Cardiology

## 2011-05-13 DIAGNOSIS — R011 Cardiac murmur, unspecified: Secondary | ICD-10-CM | POA: Insufficient documentation

## 2011-05-14 ENCOUNTER — Ambulatory Visit (INDEPENDENT_AMBULATORY_CARE_PROVIDER_SITE_OTHER): Payer: Medicare Other | Admitting: Cardiology

## 2011-05-14 ENCOUNTER — Encounter: Payer: Self-pay | Admitting: Cardiology

## 2011-05-14 DIAGNOSIS — I4581 Long QT syndrome: Secondary | ICD-10-CM

## 2011-05-14 DIAGNOSIS — R55 Syncope and collapse: Secondary | ICD-10-CM | POA: Diagnosis not present

## 2011-05-14 DIAGNOSIS — Q245 Malformation of coronary vessels: Secondary | ICD-10-CM

## 2011-05-14 DIAGNOSIS — E0590 Thyrotoxicosis, unspecified without thyrotoxic crisis or storm: Secondary | ICD-10-CM | POA: Diagnosis not present

## 2011-05-14 DIAGNOSIS — G7080 Lambert-Eaton syndrome, unspecified: Secondary | ICD-10-CM

## 2011-05-14 DIAGNOSIS — R011 Cardiac murmur, unspecified: Secondary | ICD-10-CM

## 2011-05-14 NOTE — Progress Notes (Signed)
HPI Patient is seen today to followup prior workup. I saw him last March 16, 2011. There have been question of a prolonged QT interval. This was reviewed very carefully and he has no evidence of a prolonged QT interval. The patient also had had some presyncope. There was no evidence that there was a significant cardiac component. It was noted that he is hyperthyroid. This has been treated and he has received radioactive iodine. There is question of a mild murmur. Two-dimensional echo was done on March 25, 2011. He has no significant valvular abnormalities. Left ventricular function was normal with his echo also.  We know the patient has some variation in the anatomy of his coronary arteries. However this was stable and he did not need further workup.He had undergone catheterization in 2004.    Allergies  Allergen Reactions  . Rituximab     Current Outpatient Prescriptions  Medication Sig Dispense Refill  . citalopram (CELEXA) 20 MG tablet Take 20 mg by mouth daily.      . Diaminopyridine POWD MEDICATION IS 10MG  TABLETS....IT IS NOT POWDER....TAKES 3 TABLETS  IN THE MORNING FOR A 30MG  DOSAGE, 2 TABLETS MIDDAY FOR A  20MG  DOSAGE  AND 3 TABLETS AT NIGHT FOR A 30MG  DOSAGE.      . fish oil-omega-3 fatty acids 1000 MG capsule Take 1 g by mouth 3 (three) times daily.        Marland Kitchen HYDROcodone-acetaminophen (VICODIN) 5-500 MG per tablet Take 1 tablet by mouth every 6 (six) hours as needed. PAIN.       . Multiple Vitamins-Minerals (ONE-A-DAY MENS 50+ ADVANTAGE PO) Take 1 tablet by mouth daily.        . propranolol (INDERAL) 40 MG tablet Take 1 tablet (40 mg total) by mouth 2 (two) times daily.  30 tablet  1  . pyridostigmine (MESTINON) 60 MG tablet Take 30 mg by mouth 3 (three) times daily.        Marland Kitchen zolpidem (AMBIEN) 10 MG tablet Take 10 mg by mouth at bedtime.          History   Social History  . Marital Status: Married    Spouse Name: N/A    Number of Children: 2  . Years of Education: N/A    Occupational History  . police dispatcher    Social History Main Topics  . Smoking status: Never Smoker   . Smokeless tobacco: Not on file  . Alcohol Use: No  . Drug Use: No  . Sexually Active: Yes   Other Topics Concern  . Not on file   Social History Narrative  . No narrative on file    Family History  Problem Relation Age of Onset  . Hypertension Mother   . Cirrhosis Father   . Cirrhosis Maternal Grandmother     non alcoholic  . Heart disease    . Stroke    . Cancer      Past Medical History  Diagnosis Date  . Depression   . Back pain   . Lambert-Eaton myasthenic syndrome     Followed at [facility]. and Dr. Lesia Sago  . QT interval     Question prolongation QT interval, October, 2012, review of EKG dated February 04, 2011, QT interval was not prolonged  . Hyperthyroidism     November, 201 to  . Pre-syncope     February 23, 2011  . Murmur     Systolic murmur, December, 2012,No valvular abnormalities, echo, December, 2012  . Anomalous coronary  artery origin     The patient underwent cardiac catheterization in 2004.  Coronaries revealed no stenosis.  However he had aberrant takeoff of both the right and left coronaries from the anterior aortic cusp.  There was no evidence of renal artery stenosis  . Ejection fraction     EF 60-65%, echo, March 18, 2011    Past Surgical History  Procedure Date  . Knee surgery     ROS   Patient denies fever, chills, headache, sweats, rash, change in vision, change in hearing, chest pain, cough, nausea vomiting, urinary symptoms. All other systems are reviewed and are negative.  PHYSICAL EXAM Patient is stable. He's here with his wife. He has slight droop of his left eyelid. There is no jugular venous distention. Lungs are clear. Respiratory effort is nonlabored. Cardiac exam reveals S1 and S2. There no clicks or significant murmurs today. The abdomen is soft. There is no peripheral edema.  Filed Vitals:   05/14/11 1559   BP: 122/70  Pulse: 55  Height: 5\' 10"  (1.778 m)  Weight: 210 lb (95.255 kg)    ASSESSMENT & PLAN

## 2011-05-14 NOTE — Patient Instructions (Signed)
Your physician recommends that you schedule a follow-up appointment in: No follow up needed.

## 2011-05-14 NOTE — Assessment & Plan Note (Signed)
The patient has had no recurrent presyncope. No further workup is needed.

## 2011-05-14 NOTE — Assessment & Plan Note (Signed)
The patient previously had a soft systolic murmur. I do not hear it today. His echo reveals no significant valvular abnormalities.

## 2011-05-14 NOTE — Assessment & Plan Note (Signed)
The patient has normal left jugular function by echo. There is no cardiac abnormality related to this issue at this time. No further workup.

## 2011-05-14 NOTE — Assessment & Plan Note (Signed)
The patient has received radioactive iodine under the treatment of endocrinology.

## 2011-05-14 NOTE — Assessment & Plan Note (Signed)
The patient needs no further workup of this issue.

## 2011-05-14 NOTE — Assessment & Plan Note (Signed)
The patient's QT interval is normal. No further workup is needed.

## 2011-06-17 DIAGNOSIS — G733 Myasthenic syndromes in other diseases classified elsewhere: Secondary | ICD-10-CM | POA: Diagnosis not present

## 2011-06-17 DIAGNOSIS — Z5181 Encounter for therapeutic drug level monitoring: Secondary | ICD-10-CM | POA: Insufficient documentation

## 2011-06-17 DIAGNOSIS — G479 Sleep disorder, unspecified: Secondary | ICD-10-CM | POA: Insufficient documentation

## 2011-07-01 DIAGNOSIS — E0590 Thyrotoxicosis, unspecified without thyrotoxic crisis or storm: Secondary | ICD-10-CM | POA: Diagnosis not present

## 2011-08-26 DIAGNOSIS — R05 Cough: Secondary | ICD-10-CM | POA: Diagnosis not present

## 2011-08-26 DIAGNOSIS — J019 Acute sinusitis, unspecified: Secondary | ICD-10-CM | POA: Diagnosis not present

## 2011-09-03 DIAGNOSIS — I10 Essential (primary) hypertension: Secondary | ICD-10-CM | POA: Diagnosis not present

## 2011-09-03 DIAGNOSIS — E0500 Thyrotoxicosis with diffuse goiter without thyrotoxic crisis or storm: Secondary | ICD-10-CM | POA: Diagnosis not present

## 2011-09-03 DIAGNOSIS — E890 Postprocedural hypothyroidism: Secondary | ICD-10-CM | POA: Diagnosis not present

## 2011-09-08 DIAGNOSIS — R635 Abnormal weight gain: Secondary | ICD-10-CM | POA: Diagnosis not present

## 2011-09-08 DIAGNOSIS — I10 Essential (primary) hypertension: Secondary | ICD-10-CM | POA: Diagnosis not present

## 2012-02-18 DIAGNOSIS — Z79899 Other long term (current) drug therapy: Secondary | ICD-10-CM | POA: Diagnosis not present

## 2012-02-18 DIAGNOSIS — Z23 Encounter for immunization: Secondary | ICD-10-CM | POA: Diagnosis not present

## 2012-02-18 DIAGNOSIS — G7080 Lambert-Eaton syndrome, unspecified: Secondary | ICD-10-CM | POA: Diagnosis not present

## 2012-03-08 DIAGNOSIS — E890 Postprocedural hypothyroidism: Secondary | ICD-10-CM | POA: Diagnosis not present

## 2012-03-08 DIAGNOSIS — E0500 Thyrotoxicosis with diffuse goiter without thyrotoxic crisis or storm: Secondary | ICD-10-CM | POA: Diagnosis not present

## 2012-03-08 DIAGNOSIS — I10 Essential (primary) hypertension: Secondary | ICD-10-CM | POA: Diagnosis not present

## 2012-07-04 ENCOUNTER — Other Ambulatory Visit: Payer: Self-pay | Admitting: Neurology

## 2012-07-23 IMAGING — CR DG CHEST 2V
2 series · 2 of 2 positions shown · non-contrast
Comparison: Chest CTA 06/27/2005 and earlier.

CLINICAL DATA: 50-year-old male with chest pain.  Hypertension.

CHEST - 2 VIEW

[w chest pa]
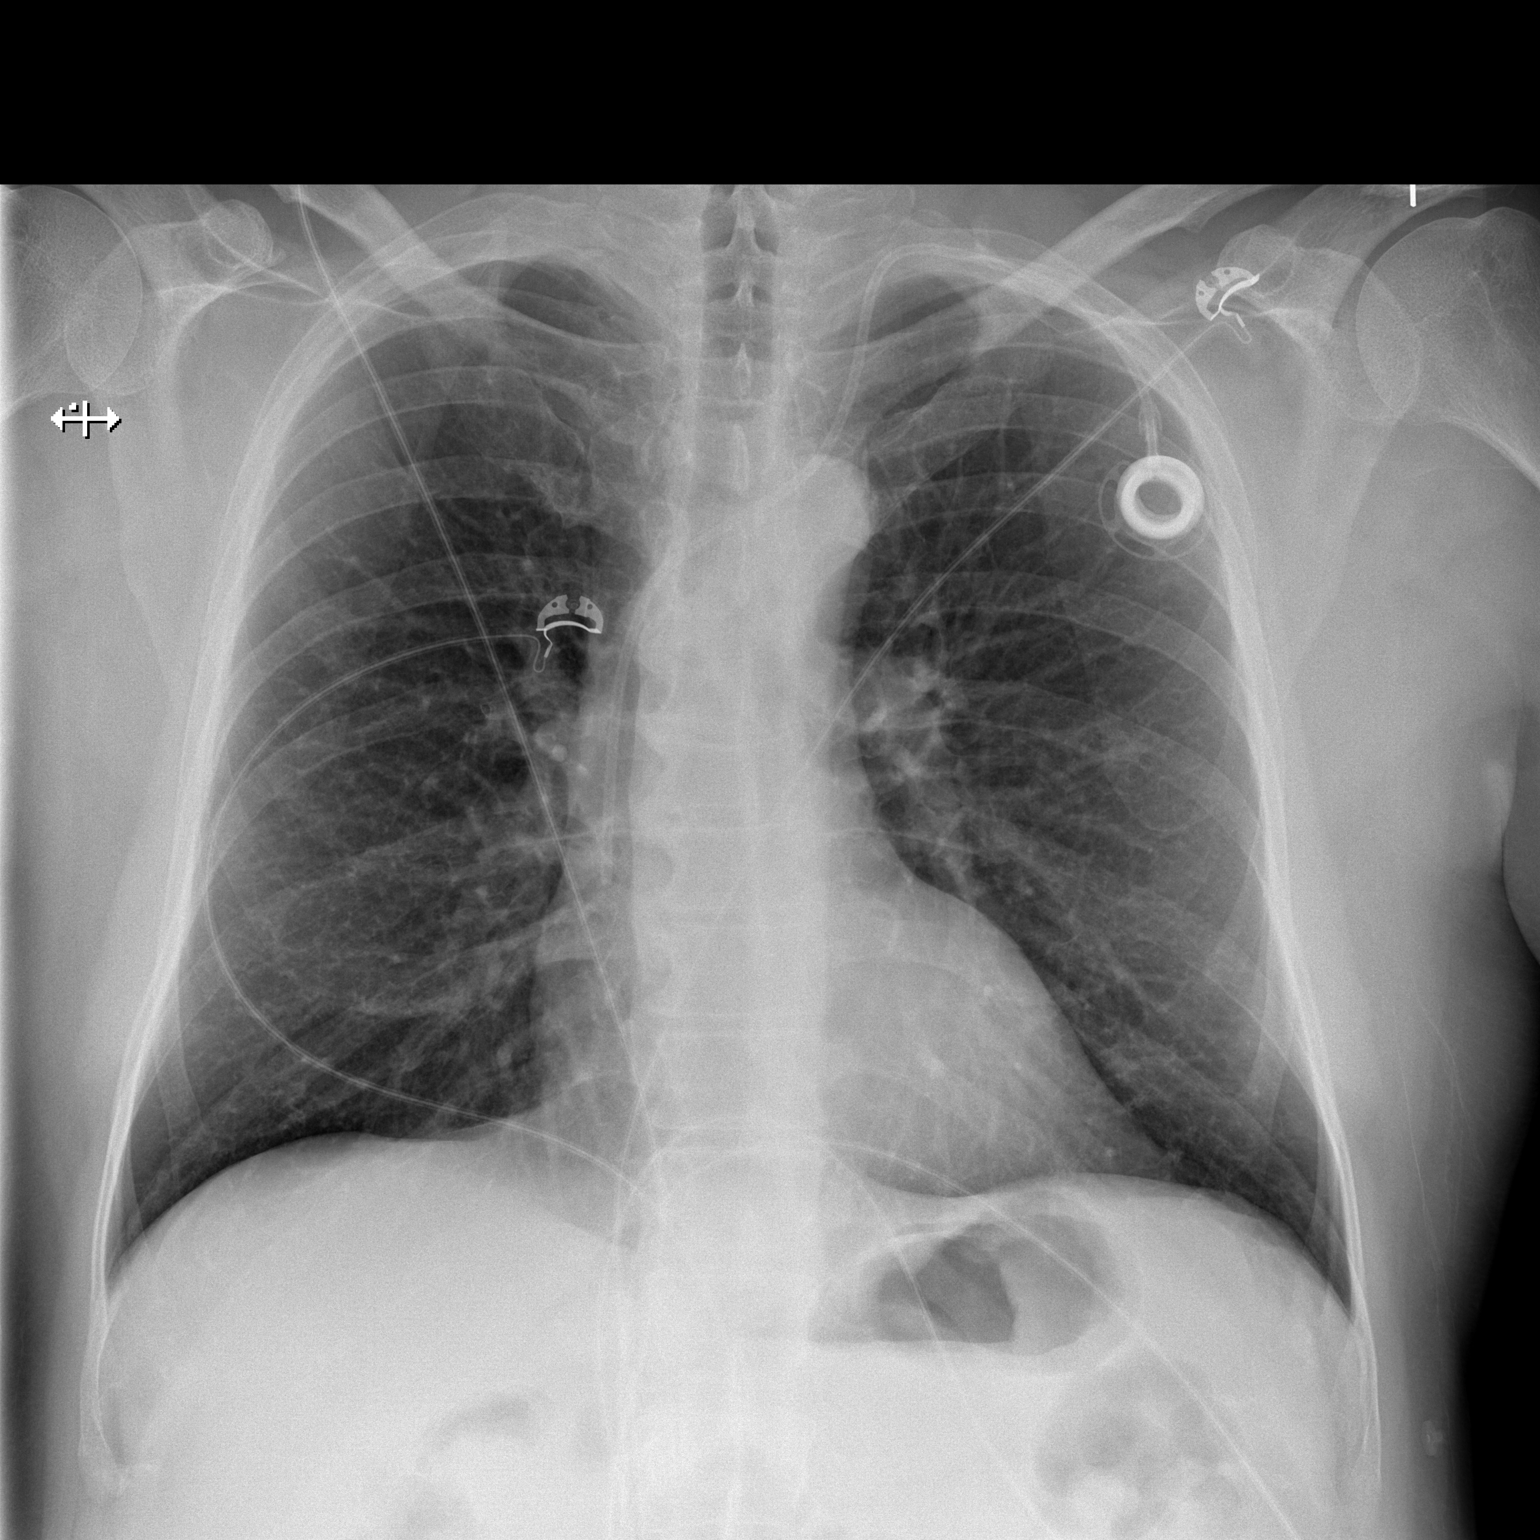

[w chest lat]
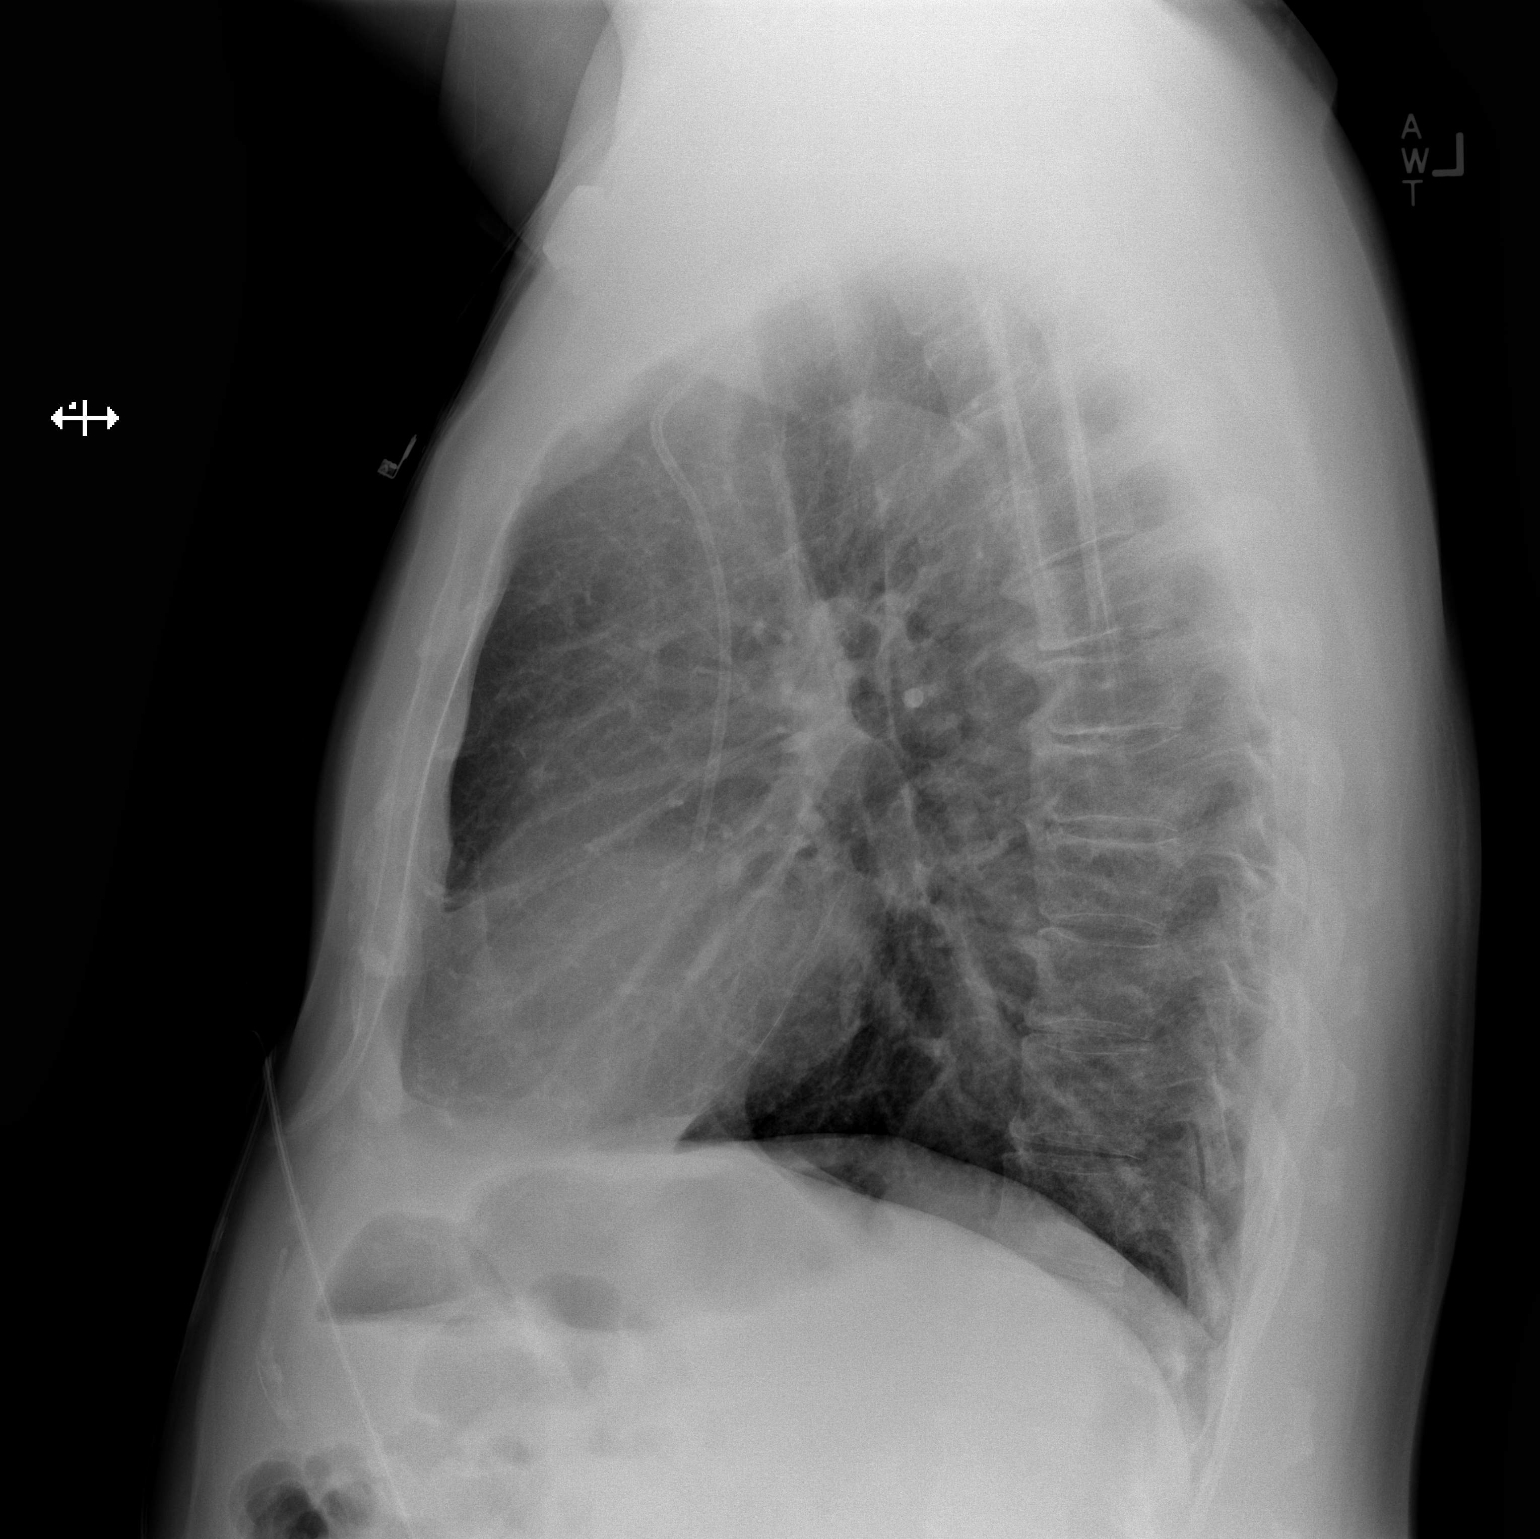

[2 of 2 positions shown; findings below may reference images not displayed]

FINDINGS: Stable left chest Port-A-Cath.  Stable lung volumes.
Cardiac size and mediastinal contours are within normal limits.
Visualized tracheal air column is within normal limits.  No
pneumothorax, pulmonary edema, pleural effusion or confluent
pulmonary opacity.  Mild loss of height in mid thoracic vertebral
bodies appears stable. No acute osseous abnormality identified.
IMPRESSION: No acute cardiopulmonary abnormality.

## 2012-10-03 ENCOUNTER — Other Ambulatory Visit: Payer: Self-pay | Admitting: Neurology

## 2012-10-11 ENCOUNTER — Encounter: Payer: Self-pay | Admitting: Neurology

## 2012-10-11 ENCOUNTER — Ambulatory Visit (INDEPENDENT_AMBULATORY_CARE_PROVIDER_SITE_OTHER): Payer: Medicare Other | Admitting: Neurology

## 2012-10-11 VITALS — BP 153/89 | HR 55 | Wt 240.0 lb

## 2012-10-11 DIAGNOSIS — F329 Major depressive disorder, single episode, unspecified: Secondary | ICD-10-CM | POA: Diagnosis not present

## 2012-10-11 DIAGNOSIS — G733 Myasthenic syndromes in other diseases classified elsewhere: Secondary | ICD-10-CM | POA: Diagnosis not present

## 2012-10-11 DIAGNOSIS — G7080 Lambert-Eaton syndrome, unspecified: Secondary | ICD-10-CM | POA: Diagnosis not present

## 2012-10-11 DIAGNOSIS — Z5181 Encounter for therapeutic drug level monitoring: Secondary | ICD-10-CM

## 2012-10-11 DIAGNOSIS — G479 Sleep disorder, unspecified: Secondary | ICD-10-CM

## 2012-10-11 MED ORDER — CITALOPRAM HYDROBROMIDE 40 MG PO TABS: 40.0000 mg | ORAL_TABLET | Freq: Every day | ORAL | Status: DC

## 2012-10-11 NOTE — Progress Notes (Signed)
Reason for visit: [name]  [name] is an 52 y.o. male  History of present illness:  [name] is a 52 year old right-handed white male with a history of [name]. The patient has done well with his strength, and he currently is on low-dose Mestinon and Diaminopyridine that he gets from [facility]. The patient however, has had worsening problems with social withdrawal, and disinterest in activities. 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 The patient works part-time, which he believes has been good for him. The patient has had a lot of problems with weight gain as he has decreased his physical activity. The patient returns for an evaluation.  Past Medical History  Diagnosis Date  . Depression   . Back pain   . Matthew myasthenic Nicholson     Followed at [facility]. and Dr. Lesia Sago  . QT interval     Question prolongation QT interval, October, 2012, review of EKG dated February 04, 2011, QT interval was not prolonged  . Hyperthyroidism     November, 201 to  . Pre-syncope     February 23, 2011  . Murmur     Systolic murmur, December, 2012,No valvular abnormalities, echo, December, 2012  . Anomalous coronary artery origin     The patient underwent cardiac catheterization in 2004.  Coronaries revealed no stenosis.  However he had aberrant takeoff of both the right and left coronaries from the anterior aortic cusp.  There was no evidence of renal artery stenosis  . Ejection fraction     EF 60-65%, echo, March 18, 2011  . Obesity   . Hypertension   . Dyslipidemia   . H/O radioactive iodine thyroid ablation     Past Surgical History  Procedure Laterality Date  . Knee surgery Right   . Tonsillectomy    . Portacath placement      Family History  Problem Relation Age of Onset  . Hypertension Mother   . Cirrhosis Father   . Cirrhosis Maternal Grandmother     non alcoholic  . Heart disease    .  Stroke    . Cancer      Social history:  reports that he has never smoked. He does not have any smokeless tobacco history on file. He reports that he does not drink alcohol or use illicit drugs.  Allergies:  Allergies  Allergen Reactions  . Toradol (Ketorolac Tromethamine)   . Vioxx (Rofecoxib)   . Rituximab   . Statins     Statins medications results in myalgias    Medications:  Current Outpatient Prescriptions on File Prior to Visit  Medication Sig Dispense Refill  . Diaminopyridine POWD MEDICATION IS 10MG  TABLETS....IT IS NOT POWDER....TAKES 3 TABLETS  IN THE MORNING FOR A 30MG  DOSAGE, 2 TABLETS MIDDAY FOR A  20MG  DOSAGE  AND 3 TABLETS AT NIGHT FOR A 30MG  DOSAGE.      . fish oil-omega-3 fatty acids 1000 MG capsule Take 1 g by mouth 3 (three) times daily.        . Multiple Vitamins-Minerals (ONE-A-DAY MENS 50+ ADVANTAGE PO) Take 1 tablet by mouth daily.        Marland Kitchen pyridostigmine (MESTINON) 60 MG tablet Take 30 mg by mouth 3 (three) times daily.        Marland Kitchen zolpidem (AMBIEN) 10 MG tablet Take 10 mg by mouth at bedtime.        . propranolol (INDERAL) 40 MG tablet Take 1 tablet (40  mg total) by mouth 2 (two) times daily.  30 tablet  1   No current facility-administered medications on file prior to visit.    ROS:  Out of a complete 14 system review of symptoms, the patient complains only of the following symptoms, and all other reviewed systems are negative.  Weight gain, fatigue Difficulty swallowing Skin rash, itching Muscle cramps Weakness Depression, decreased energy, disinterest in activities Insomnia  Blood pressure 153/89, pulse 55, weight 240 lb (108.863 kg).  Physical Exam  General: The patient is alert and cooperative at the time of the examination. The patient is moderately obese.  Skin: No significant peripheral edema is noted.   Neurologic Exam  Cranial nerves: Facial symmetry is present. Speech is normal, no aphasia or dysarthria is noted. Extraocular  movements are full. Visual fields are full.  Motor: The patient has good strength in all 4 extremities.  Coordination: The patient has good finger-nose-finger and heel-to-shin bilaterally.  Gait and station: The patient has a normal gait. Tandem gait is normal. Romberg is negative. No drift is seen.  Reflexes: Deep tendon reflexes are symmetric.   Assessment/Plan:  1. [name]  2. Depression  The patient will have his Celexa increased to a 40 mg dose. If this is not effective, the patient will contact me, and we will consider a switch to Wellbutrin. The patient is doing well with his [name]. The patient otherwise will followup in 6-8 months.  Marlan Palau MD 10/11/2012 8:54 PM  [facility] [address] [place], [place] [address]  Phone [phone] Fax [phone]

## 2012-10-17 ENCOUNTER — Other Ambulatory Visit: Payer: Self-pay | Admitting: Neurology

## 2012-10-20 ENCOUNTER — Other Ambulatory Visit: Payer: Self-pay | Admitting: Neurology

## 2012-10-26 ENCOUNTER — Other Ambulatory Visit: Payer: Self-pay | Admitting: Family Medicine

## 2012-10-28 NOTE — Telephone Encounter (Signed)
Not seen here since 09/08/11 by you

## 2012-11-10 ENCOUNTER — Telehealth: Payer: Self-pay | Admitting: Neurology

## 2012-11-11 MED ORDER — BUPROPION HCL ER (SR) 150 MG PO TB12: 150.0000 mg | ORAL_TABLET | Freq: Every day | ORAL | Status: DC

## 2012-11-11 NOTE — Telephone Encounter (Signed)
I called patient. The patient is not doing well with the Celexa. He'll cut back to 20 mg tablet for one week, and then stop the medication. He is to look out for the withdrawal syndrome from SSRI antidepressants with dizziness. The patient will be started on Wellbutrin when he stopped the Celexa.

## 2012-12-09 ENCOUNTER — Other Ambulatory Visit: Payer: Self-pay | Admitting: Family Medicine

## 2012-12-11 ENCOUNTER — Telehealth: Payer: Self-pay | Admitting: Family Medicine

## 2012-12-13 ENCOUNTER — Other Ambulatory Visit: Payer: Self-pay | Admitting: Family Medicine

## 2012-12-13 NOTE — Telephone Encounter (Signed)
Pt notified and will sch appt

## 2012-12-13 NOTE — Telephone Encounter (Signed)
Last seen 09/08/11  FPW  Patient calling that needs his BP meds

## 2012-12-13 NOTE — Telephone Encounter (Signed)
Patient needs to be seen. Has exceeded time since last visit. Needs to bring all medications to next appointment.   

## 2012-12-15 ENCOUNTER — Encounter: Payer: Self-pay | Admitting: Family Medicine

## 2012-12-15 ENCOUNTER — Ambulatory Visit (INDEPENDENT_AMBULATORY_CARE_PROVIDER_SITE_OTHER): Payer: Medicare Other | Admitting: Family Medicine

## 2012-12-15 VITALS — BP 161/95 | HR 61 | Temp 98.5°F | Ht 71.0 in | Wt 239.0 lb

## 2012-12-15 DIAGNOSIS — R531 Weakness: Secondary | ICD-10-CM

## 2012-12-15 DIAGNOSIS — I10 Essential (primary) hypertension: Secondary | ICD-10-CM | POA: Diagnosis not present

## 2012-12-15 DIAGNOSIS — E039 Hypothyroidism, unspecified: Secondary | ICD-10-CM | POA: Diagnosis not present

## 2012-12-15 DIAGNOSIS — R5383 Other fatigue: Secondary | ICD-10-CM | POA: Diagnosis not present

## 2012-12-15 DIAGNOSIS — L0292 Furuncle, unspecified: Secondary | ICD-10-CM | POA: Diagnosis not present

## 2012-12-15 DIAGNOSIS — R5381 Other malaise: Secondary | ICD-10-CM

## 2012-12-15 MED ORDER — PROPRANOLOL HCL 40 MG PO TABS: 40.0000 mg | ORAL_TABLET | Freq: Two times a day (BID) | ORAL | Status: DC

## 2012-12-15 MED ORDER — MUPIROCIN CALCIUM 2 % NA OINT: TOPICAL_OINTMENT | Freq: Two times a day (BID) | NASAL | Status: DC

## 2012-12-15 MED ORDER — SULFAMETHOXAZOLE-TMP DS 800-160 MG PO TABS: 1.0000 | ORAL_TABLET | Freq: Two times a day (BID) | ORAL | Status: DC

## 2012-12-15 NOTE — Patient Instructions (Addendum)

## 2012-12-16 LAB — POCT CBC
Granulocyte percent: 66.5 %G (ref 37–80)
HCT, POC: 48.7 % (ref 43.5–53.7)
Hemoglobin: 15.9 g/dL (ref 14.1–18.1)
Lymph, poc: 2.2 (ref 0.6–3.4)
MCH, POC: 29.8 pg (ref 27–31.2)
MCHC: 32.7 g/dL (ref 31.8–35.4)
MCV: 90.9 fL (ref 80–97)
MPV: 9 fL (ref 0–99.8)
POC Granulocyte: 4.9 (ref 2–6.9)
POC LYMPH PERCENT: 29.8 %L (ref 10–50)
Platelet Count, POC: 185 10*3/uL (ref 142–424)
RBC: 5.4 M/uL (ref 4.69–6.13)
RDW, POC: 13.2 %
WBC: 7.3 10*3/uL (ref 4.6–10.2)

## 2012-12-16 LAB — CMP14+EGFR
ALT: 62 IU/L — ABNORMAL HIGH (ref 0–44)
AST: 75 IU/L — ABNORMAL HIGH (ref 0–40)
Albumin/Globulin Ratio: 1.5 (ref 1.1–2.5)
Albumin: 4.6 g/dL (ref 3.5–5.5)
Alkaline Phosphatase: 64 IU/L (ref 39–117)
BUN/Creatinine Ratio: 12 (ref 9–20)
BUN: 11 mg/dL (ref 6–24)
CO2: 25 mmol/L (ref 18–29)
Calcium: 9.6 mg/dL (ref 8.7–10.2)
Chloride: 97 mmol/L (ref 97–108)
Creatinine, Ser: 0.95 mg/dL (ref 0.76–1.27)
GFR calc Af Amer: 108 mL/min/{1.73_m2} (ref >59)
GFR calc non Af Amer: 94 mL/min/{1.73_m2} (ref >59)
Globulin, Total: 3.1 g/dL (ref 1.5–4.5)
Glucose: 100 mg/dL — ABNORMAL HIGH (ref 65–99)
Potassium: 4.6 mmol/L (ref 3.5–5.2)
Sodium: 141 mmol/L (ref 134–144)
Total Bilirubin: 0.5 mg/dL (ref 0.0–1.2)
Total Protein: 7.7 g/dL (ref 6.0–8.5)

## 2012-12-16 LAB — THYROID PANEL WITH TSH
Free Thyroxine Index: 2.8 (ref 1.2–4.9)
T3 Uptake Ratio: 32 % (ref 24–39)
T4, Total: 8.7 ug/dL (ref 4.5–12.0)
TSH: 2.95 u[IU]/mL (ref 0.450–4.500)

## 2012-12-16 NOTE — Progress Notes (Signed)
  Subjective:    Patient ID: [name], male    DOB: 1961/04/08, 52 y.o.   MRN: [id number]  HPI This 52 y.o. male presents for evaluation of follow up visit. He has Pincus Badder Myasthenic Gravis syndrome and is being followed by Dr. Broadus John at [facility].  He has hx of hypothyroidism. He is having a rash or bumps pop up all over and now on his right neck and left abdomen .   Review of Systems No chest pain, SOB, HA, dizziness, vision change, N/V, diarrhea, constipation, dysuria, urinary urgency or frequency, myalgias, arthralgias or rash.     Objective:   Physical Exam Vital signs noted  Well developed well nourished male.  HEENT - Head atraumatic Normocephalic                Eyes - PERRLA, Conjuctiva - clear Sclera- Clear EOMI                Ears - EAC's Wnl TM's Wnl Gross Hearing WNL                Nose - Nares patent                 Throat - oropharanx wnl Respiratory - Lungs CTA bilateral Cardiac - RRR S1 and S2 without murmur GI - Abdomen soft Nontender and bowel sounds active x 4 Extremities - No edema. Neuro - Grossly intact. Skin - furunculosis on right neck and left lower abdomen without fluctuance or drainage.      Assessment & Plan:  Unspecified hypothyroidism - Plan: Thyroid Panel With TSH  Weakness - Plan: POCT CBC, CMP14+EGFR  Essential hypertension, benign - Plan: propranolol (INDERAL) 40 MG tablet  Furunculosis - Plan: mupirocin nasal ointment (BACTROBAN NASAL) 2 %, sulfamethoxazole-trimethoprim (BACTRIM DS) 800-160 MG per tablet  Pincus Badder Myasthenia Gravis - Suspect this is the reason for his fatigue and recommend he Continue to follow up with Neurology.  Follow up in 3 months or prn if weakness worsens

## 2012-12-19 NOTE — Telephone Encounter (Signed)
WANTS THE ANTIBIOTIC CREAM NASAL AREA -. S[facility] TO [facility]

## 2012-12-21 ENCOUNTER — Other Ambulatory Visit: Payer: Self-pay | Admitting: Family Medicine

## 2012-12-21 DIAGNOSIS — L0292 Furuncle, unspecified: Secondary | ICD-10-CM

## 2012-12-21 MED ORDER — MUPIROCIN CALCIUM 2 % NA OINT: TOPICAL_OINTMENT | Freq: Two times a day (BID) | NASAL | Status: DC

## 2012-12-21 NOTE — Telephone Encounter (Signed)
I re-sent his bactroban nasal ointment to his pharmacy

## 2012-12-22 NOTE — Telephone Encounter (Signed)
Left message on pt cell phone that rx resent

## 2013-01-16 ENCOUNTER — Telehealth: Payer: Self-pay | Admitting: Neurology

## 2013-01-17 MED ORDER — HYDROCODONE-ACETAMINOPHEN 5-325 MG PO TABS: 1.0000 | ORAL_TABLET | Freq: Four times a day (QID) | ORAL | Status: DC | PRN

## 2013-01-17 NOTE — Telephone Encounter (Signed)
Call pt about the medication hydrocodone 5-325mg , pharmacy will not refill it because pt has to have a prescription for this medication everytime that it is being refilled, pt stated. Pt needs a refill on this medication.

## 2013-01-17 NOTE — Telephone Encounter (Signed)
I'll refill the hydrocodone.

## 2013-02-13 ENCOUNTER — Telehealth: Payer: Self-pay | Admitting: *Deleted

## 2013-02-16 ENCOUNTER — Other Ambulatory Visit: Payer: Self-pay

## 2013-02-20 ENCOUNTER — Telehealth: Payer: Self-pay | Admitting: Neurology

## 2013-02-22 ENCOUNTER — Other Ambulatory Visit: Payer: Self-pay | Admitting: Neurology

## 2013-02-22 DIAGNOSIS — R197 Diarrhea, unspecified: Secondary | ICD-10-CM | POA: Diagnosis not present

## 2013-02-22 DIAGNOSIS — Z79899 Other long term (current) drug therapy: Secondary | ICD-10-CM | POA: Diagnosis not present

## 2013-02-22 DIAGNOSIS — Z23 Encounter for immunization: Secondary | ICD-10-CM | POA: Diagnosis not present

## 2013-02-22 DIAGNOSIS — G7080 Lambert-Eaton syndrome, unspecified: Secondary | ICD-10-CM | POA: Diagnosis not present

## 2013-02-22 MED ORDER — HYDROCODONE-ACETAMINOPHEN 5-325 MG PO TABS: 1.0000 | ORAL_TABLET | Freq: Four times a day (QID) | ORAL | Status: DC | PRN

## 2013-02-22 NOTE — Telephone Encounter (Signed)
Pt stated that he would be picking up his prescription. I inform the patient that I would be leaving it at the front desk and if he has any questions or concerns to let us know.

## 2013-02-28 ENCOUNTER — Ambulatory Visit (INDEPENDENT_AMBULATORY_CARE_PROVIDER_SITE_OTHER): Payer: Medicare Other | Admitting: Family Medicine

## 2013-02-28 ENCOUNTER — Encounter: Payer: Self-pay | Admitting: Family Medicine

## 2013-02-28 VITALS — BP 141/85 | HR 59 | Temp 98.8°F | Ht 71.0 in | Wt 243.0 lb

## 2013-02-28 DIAGNOSIS — E119 Type 2 diabetes mellitus without complications: Secondary | ICD-10-CM | POA: Diagnosis not present

## 2013-02-28 DIAGNOSIS — R7989 Other specified abnormal findings of blood chemistry: Secondary | ICD-10-CM

## 2013-02-28 DIAGNOSIS — R739 Hyperglycemia, unspecified: Secondary | ICD-10-CM

## 2013-02-28 DIAGNOSIS — E039 Hypothyroidism, unspecified: Secondary | ICD-10-CM | POA: Diagnosis not present

## 2013-02-28 LAB — POCT GLYCOSYLATED HEMOGLOBIN (HGB A1C): Hemoglobin A1C: 7.3

## 2013-02-28 MED ORDER — METFORMIN HCL 500 MG PO TABS: 500.0000 mg | ORAL_TABLET | Freq: Two times a day (BID) | ORAL | Status: DC

## 2013-02-28 MED ORDER — GLUCOSE BLOOD VI STRP: 1.0000 | ORAL_STRIP | Freq: Three times a day (TID) | Status: DC

## 2013-02-28 MED ORDER — LISINOPRIL 5 MG PO TABS: 5.0000 mg | ORAL_TABLET | Freq: Every day | ORAL | Status: DC

## 2013-02-28 MED ORDER — ONETOUCH DELICA LANCETS FINE MISC: 1.0000 | Freq: Three times a day (TID) | Status: DC

## 2013-02-28 NOTE — Patient Instructions (Signed)
Check blood sugar in the morning fasting, 2 hours after lunch and 2 hours after supper. Record these readings and bring them to your next visit.  Diabetes Meal Planning Guide The diabetes meal planning guide is a tool to help you plan your meals and snacks. It is important for people with diabetes to manage their blood glucose (sugar) levels. Choosing the right foods and the right amounts throughout your day will help control your blood glucose. Eating right can even help you improve your blood pressure and reach or maintain a healthy weight. CARBOHYDRATE COUNTING MADE EASY When you eat carbohydrates, they turn to sugar. This raises your blood glucose level. Counting carbohydrates can help you control this level so you feel better. When you plan your meals by counting carbohydrates, you can have more flexibility in what you eat and balance your medicine with your food intake. Carbohydrate counting simply means adding up the total amount of carbohydrate grams in your meals and snacks. Try to eat about the same amount at each meal. Foods with carbohydrates are listed below. Each portion below is 1 carbohydrate serving or 15 grams of carbohydrates. Ask your dietician how many grams of carbohydrates you should eat at each meal or snack. Grains and Starches  1 slice bread.   English muffin or hotdog/hamburger bun.   cup cold cereal (unsweetened).   cup cooked pasta or rice.   cup starchy vegetables (corn, potatoes, peas, beans, winter squash).  1 tortilla (6 inches).   bagel.  1 waffle or pancake (size of a CD).   cup cooked cereal.  4 to 6 small crackers. *Whole grain is recommended. Fruit  1 cup fresh unsweetened berries, melon, papaya, pineapple.  1 small fresh fruit.   banana or mango.   cup fruit juice (4 oz unsweetened).   cup canned fruit in natural juice or water.  2 tbs dried fruit.  12 to 15 grapes or cherries. Milk and Yogurt  1 cup fat-free or 1%  milk.  1 cup soy milk.  6 oz light yogurt with sugar-free sweetener.  6 oz low-fat soy yogurt.  6 oz plain yogurt. Vegetables  1 cup raw or  cup cooked is counted as 0 carbohydrates or a "free" food.  If you eat 3 or more servings at 1 meal, count them as 1 carbohydrate serving. Other Carbohydrates   oz chips or pretzels.   cup ice cream or frozen yogurt.   cup sherbet or sorbet.  2 inch square cake, no frosting.  1 tbs honey, sugar, jam, jelly, or syrup.  2 small cookies.  3 squares of graham crackers.  3 cups popcorn.  6 crackers.  1 cup broth-based soup.  Count 1 cup casserole or other mixed foods as 2 carbohydrate servings.  Foods with less than 20 calories in a serving may be counted as 0 carbohydrates or a "free" food. You may want to purchase a book or computer software that lists the carbohydrate gram counts of different foods. In addition, the nutrition facts panel on the labels of the foods you eat are a good source of this information. The label will tell you how big the serving size is and the total number of carbohydrate grams you will be eating per serving. Divide this number by 15 to obtain the number of carbohydrate servings in a portion. Remember, 1 carbohydrate serving equals 15 grams of carbohydrate. SERVING SIZES Measuring foods and serving sizes helps you make sure you are getting the right amount of  food. The list below tells how big or small some common serving sizes are.  1 oz.........4 stacked dice.  3 oz........Marland KitchenDeck of cards.  1 tsp.......Marland KitchenTip of little finger.  1 tbs......Marland KitchenMarland KitchenThumb.  2 tbs.......Marland KitchenGolf ball.   cup......Marland KitchenHalf of a fist.  1 cup.......Marland KitchenA fist. SAMPLE DIABETES MEAL PLAN Below is a sample meal plan that includes foods from the grain and starches, dairy, vegetable, fruit, and meat groups. A dietician can individualize a meal plan to fit your calorie needs and tell you the number of servings needed from each food group.  However, controlling the total amount of carbohydrates in your meal or snack is more important than making sure you include all of the food groups at every meal. You may interchange carbohydrate containing foods (dairy, starches, and fruits). The meal plan below is an example of a 2000 calorie diet using carbohydrate counting. This meal plan has 17 carbohydrate servings. Breakfast  1 cup oatmeal (2 carb servings).   cup light yogurt (1 carb serving).  1 cup blueberries (1 carb serving).   cup almonds. Snack  1 large apple (2 carb servings).  1 low-fat string cheese stick. Lunch  Chicken breast salad.  1 cup spinach.   cup chopped tomatoes.  2 oz chicken breast, sliced.  2 tbs low-fat [place] dressing.  12 whole-wheat crackers (2 carb servings).  12 to 15 grapes (1 carb serving).  1 cup low-fat milk (1 carb serving). Snack  1 cup carrots.   cup hummus (1 carb serving). Dinner  3 oz broiled salmon.  1 cup brown rice (3 carb servings). Snack  1  cups steamed broccoli (1 carb serving) drizzled with 1 tsp olive oil and lemon juice.  1 cup light pudding (2 carb servings). DIABETES MEAL PLANNING WORKSHEET Your dietician can use this worksheet to help you decide how many servings of foods and what types of foods are right for you.  BREAKFAST Food Group and Servings / Carb Servings Grain/Starches __________________________________ Dairy __________________________________________ Vegetable ______________________________________ Fruit ___________________________________________ Meat __________________________________________ Fat ____________________________________________ LUNCH Food Group and Servings / Carb Servings Grain/Starches ___________________________________ Dairy ___________________________________________ Fruit ____________________________________________ Meat ___________________________________________ Fat  _____________________________________________ Laural Golden Food Group and Servings / Carb Servings Grain/Starches ___________________________________ Dairy ___________________________________________ Fruit ____________________________________________ Meat ___________________________________________ Fat _____________________________________________ SNACKS Food Group and Servings / Carb Servings Grain/Starches ___________________________________ Dairy ___________________________________________ Vegetable _______________________________________ Fruit ____________________________________________ Meat ___________________________________________ Fat _____________________________________________ DAILY TOTALS Starches _________________________ Vegetable ________________________ Fruit ____________________________ Dairy ____________________________ Meat ____________________________ Fat ______________________________ Document Released: 12/25/2004 Document Revised: 06/22/2011 Document Reviewed: 11/05/2008 [employer] Patient Information 2014 [employer].  Type 2 Diabetes Mellitus, Adult Type 2 diabetes mellitus, often simply referred to as type 2 diabetes, is a long-lasting (chronic) disease. In type 2 diabetes, the pancreas does not make enough insulin (a hormone), the cells are less responsive to the insulin that is made (insulin resistance), or both. Normally, insulin moves sugars from food into the tissue cells. The tissue cells use the sugars for energy. The lack of insulin or the lack of normal response to insulin causes excess sugars to build up in the blood instead of going into the tissue cells. As a result, high blood sugar (hyperglycemia) develops. The effect of high sugar (glucose) levels can cause many complications. Type 2 diabetes was also previously called adult-onset diabetes but it can occur at any age.  RISK FACTORS  A person is predisposed to developing type 2 diabetes if someone  in the family has the disease and also has one or more of the following primary risk factors:  Overweight.  An inactive lifestyle.  A history of consistently eating high-calorie foods. Maintaining a normal weight and regular physical activity can reduce the chance of developing type 2 diabetes. SYMPTOMS  A person with type 2 diabetes may not show symptoms initially. The symptoms of type 2 diabetes appear slowly. The symptoms include:  Increased thirst (polydipsia).  Increased urination (polyuria).  Increased urination during the night (nocturia).  Weight loss. This weight loss may be rapid.  Frequent, recurring infections.  Tiredness (fatigue).  Weakness.  Vision changes, such as blurred vision.  Fruity smell to your breath.  Abdominal pain.  Nausea or vomiting.  Cuts or bruises which are slow to heal.  Tingling or numbness in the hands or feet. DIAGNOSIS Type 2 diabetes is frequently not diagnosed until complications of diabetes are present. Type 2 diabetes is diagnosed when symptoms or complications are present and when blood glucose levels are increased. Your blood glucose level may be checked by one or more of the following blood tests:  A fasting blood glucose test. You will not be allowed to eat for at least 8 hours before a blood sample is taken.  A random blood glucose test. Your blood glucose is checked at any time of the day regardless of when you ate.  A hemoglobin A1c blood glucose test. A hemoglobin A1c test provides information about blood glucose control over the previous 3 months.  An oral glucose tolerance test (OGTT). Your blood glucose is measured after you have not eaten (fasted) for 2 hours and then after you drink a glucose-containing beverage. TREATMENT   You may need to take insulin or diabetes medicine daily to keep blood glucose levels in the desired range.  You will need to match insulin dosing with exercise and healthy food choices. The  treatment goal is to maintain the before meal blood sugar (preprandial glucose) level at 70 130 mg/dL. HOME CARE INSTRUCTIONS   Have your hemoglobin A1c level checked twice a year.  Perform daily blood glucose monitoring as directed by your caregiver.  Monitor urine ketones when you are ill and as directed by your caregiver.  Take your diabetes medicine or insulin as directed by your caregiver to maintain your blood glucose levels in the desired range.  Never run out of diabetes medicine or insulin. It is needed every day.  Adjust insulin based on your intake of carbohydrates. Carbohydrates can raise blood glucose levels but need to be included in your diet. Carbohydrates provide vitamins, minerals, and fiber which are an essential part of a healthy diet. Carbohydrates are found in fruits, vegetables, whole grains, dairy products, legumes, and foods containing added sugars.    Eat healthy foods. Alternate 3 meals with 3 snacks.  Lose weight if overweight.  Carry a medical alert card or wear your medical alert jewelry.  Carry a 15 gram carbohydrate snack with you at all times to treat low blood glucose (hypoglycemia). Some examples of 15 gram carbohydrate snacks include:  Glucose tablets, 3 or 4   Glucose gel, 15 gram tube  Raisins, 2 tablespoons (24 grams)  Jelly beans, 6  Animal crackers, 8  Regular pop, 4 ounces (120 mL)  Gummy treats, 9  Recognize hypoglycemia. Hypoglycemia occurs with blood glucose levels of 70 mg/dL and below. The risk for hypoglycemia increases when fasting or skipping meals, during or after intense exercise, and during sleep. Hypoglycemia symptoms can include:  Tremors or shakes.  Decreased ability to concentrate.  Sweating.  Increased heart rate.  Headache.  Dry mouth.  Hunger.  Irritability.  Anxiety.  Restless sleep.  Altered speech or coordination.  Confusion.  Treat hypoglycemia promptly. If you are alert and able to  safely swallow, follow the 15:15 rule:  Take 15 20 grams of rapid-acting glucose or carbohydrate. Rapid-acting options include glucose gel, glucose tablets, or 4 ounces (120 mL) of fruit juice, regular soda, or low fat milk.  Check your blood glucose level 15 minutes after taking the glucose.  Take 15 20 grams more of glucose if the repeat blood glucose level is still 70 mg/dL or below.  Eat a meal or snack within 1 hour once blood glucose levels return to normal.    Be alert to polyuria and polydipsia which are early signs of hyperglycemia. An early awareness of hyperglycemia allows for prompt treatment. Treat hyperglycemia as directed by your caregiver.  Engage in at least 150 minutes of moderate-intensity physical activity a week, spread over at least 3 days of the week or as directed by your caregiver. In addition, you should engage in resistance exercise at least 2 times a week or as directed by your caregiver.  Adjust your medicine and food intake as needed if you start a new exercise or sport.  Follow your sick day plan at any time you are unable to eat or drink as usual.  Avoid tobacco use.  Limit alcohol intake to no more than 1 drink per day for nonpregnant women and 2 drinks per day for men. You should drink alcohol only when you are also eating food. Talk with your caregiver whether alcohol is safe for you. Tell your caregiver if you drink alcohol several times a week.  Follow up with your caregiver regularly.  Schedule an eye exam soon after the diagnosis of type 2 diabetes and then annually.  Perform daily skin and foot care. Examine your skin and feet daily for cuts, bruises, redness, nail problems, bleeding, blisters, or sores. A foot exam by a caregiver should be done annually.  Brush your teeth and gums at least twice a day and floss at least once a day. Follow up with your dentist regularly.  Share your diabetes management plan with your workplace or  school.  Stay up-to-date with immunizations.  Learn to manage stress.  Obtain ongoing diabetes education and support as needed.  Participate in, or seek rehabilitation as needed to maintain or improve independence and quality of life. Request a physical or occupational therapy referral if you are having foot or hand numbness or difficulties with grooming, dressing, eating, or physical activity. SEEK MEDICAL CARE IF:   You are unable to eat food or drink fluids for more than 6 hours.  You have nausea and vomiting for more than 6 hours.  Your blood glucose level is over 240 mg/dL.  There is a change in mental status.  You develop an additional serious illness.  You have diarrhea for more than 6 hours.  You have been sick or have had a fever for a couple of days and are not getting better.  You have pain during any physical activity.  SEEK IMMEDIATE MEDICAL CARE IF:  You have difficulty breathing.  You have moderate to large ketone levels. MAKE SURE YOU:  Understand these instructions.  Will watch your condition.  Will get help right away if you are not doing well or get worse. Document Released: 03/30/2005 Document Revised: 12/23/2011 Document Reviewed: 10/27/2011 [facility] Patient Information 2014 [place], [place].

## 2013-02-28 NOTE — Progress Notes (Signed)
  Subjective:    Patient ID: [name], male    DOB: 06-12-1960, 52 y.o.   MRN: [id number]  HPI  Pt presents today with chief complaint of hyperglycemia Pt has multiple medical issues including lambert-eaton syndrome- followed at [facility] Pt went for follow up visit last week. Had some blood work that showed blood sgar of > 250 Pt states that he has had 6-8 weeks of polyuria, polyphagia and polydypsia  No prior hx/o diabetes in the past.  Pt denies any excessive candy or carb intake. But does drink a lot of juices.  Physical activity is limited 2/2 lambert-eaton but does try to walk daily.   Patient Active Problem List   Diagnosis Date Noted  . Sleep disturbance, unspecified 06/17/2011  . Encounter for therapeutic drug monitoring 06/17/2011  . Myasthenic syndromes in diseases classified elsewhere 06/17/2011  . Murmur   . Ejection fraction   . Depression   . Back pain   . Lambert-Eaton myasthenic syndrome   . QT interval   . Hyperthyroidism   . Pre-syncope   . Anomalous coronary artery origin     Review of Systems  All other systems reviewed and are negative.       Objective:   Physical Exam  Constitutional: He is oriented to person, place, and time. He appears well-developed.  Obese    HENT:  Head: Normocephalic and atraumatic.  Eyes: Conjunctivae are normal. Pupils are equal, round, and reactive to light.  Neck: Normal range of motion.  Cardiovascular: Normal rate and regular rhythm.   Pulmonary/Chest: Effort normal and breath sounds normal.  Abdominal: Soft.  Musculoskeletal: Normal range of motion.  Neurological: He is alert and oriented to person, place, and time.  Skin: Skin is warm.   Lab Results  Component Value Date   HGBA1C 7.3% 02/28/2013         Assessment & Plan:  Elevated LFTs - Plan: CMP14+EGFR  Hyperglycemia - Plan: POCT glycosylated hemoglobin (Hb A1C), POCT glucose (manual entry), POCT UA - Microalbumin  Diabetes - Plan: metFORMIN  (GLUCOPHAGE) 500 MG tablet, lisinopril (PRINIVIL,ZESTRIL) 5 MG tablet, ONETOUCH DELICA LANCETS FINE MISC  Will start on low dose metformin as well as low dose lisinopril  Given A1C, can likely be controlled with monotherapy.  Cr, K and LFTs today (prior hx/o mildly elevated LFTs 12/2012). Also check lipoprofile and urine microalbumin.  Plan for recheck of Cr and K in 1 week  Discussed avoiding high sugar drinks and exercise.  Handout given Follow up in  6 weeks.

## 2013-02-28 NOTE — Addendum Note (Signed)
Addended by: Lisbeth Ply C on: 02/28/2013 10:42 AM   Modules accepted: Orders

## 2013-03-01 ENCOUNTER — Other Ambulatory Visit: Payer: Self-pay | Admitting: *Deleted

## 2013-03-01 ENCOUNTER — Other Ambulatory Visit: Payer: Self-pay | Admitting: Family Medicine

## 2013-03-01 DIAGNOSIS — E039 Hypothyroidism, unspecified: Secondary | ICD-10-CM

## 2013-03-01 LAB — LIPID PANEL
Cholesterol, Total: 269 mg/dL — ABNORMAL HIGH (ref 100–199)
HDL: 35 mg/dL — ABNORMAL LOW (ref >39)
LDL Calculated: 173 mg/dL — ABNORMAL HIGH (ref 0–99)
VLDL Cholesterol Cal: 61 mg/dL — ABNORMAL HIGH (ref 5–40)

## 2013-03-01 LAB — CMP14+EGFR
ALT: 45 IU/L — ABNORMAL HIGH (ref 0–44)
AST: 55 IU/L — ABNORMAL HIGH (ref 0–40)
Albumin/Globulin Ratio: 1.9 (ref 1.1–2.5)
CO2: 25 mmol/L (ref 18–29)
Calcium: 9.3 mg/dL (ref 8.7–10.2)
Creatinine, Ser: 1.02 mg/dL (ref 0.76–1.27)
GFR calc Af Amer: 99 mL/min/{1.73_m2} (ref >59)
GFR calc non Af Amer: 85 mL/min/{1.73_m2} (ref >59)
Globulin, Total: 2.4 g/dL (ref 1.5–4.5)
Potassium: 4.1 mmol/L (ref 3.5–5.2)
Sodium: 137 mmol/L (ref 134–144)
Total Bilirubin: 0.5 mg/dL (ref 0.0–1.2)

## 2013-03-01 MED ORDER — LEVOTHYROXINE SODIUM 150 MCG PO TABS: 150.0000 ug | ORAL_TABLET | Freq: Every day | ORAL | Status: DC

## 2013-03-01 NOTE — Progress Notes (Signed)
Patient aware that he should start new synthroid and rck thyroid in 6 weeks. He will f/u PRN.

## 2013-03-02 NOTE — Telephone Encounter (Signed)
Patient states he was recently dx with diabetes and high cholesterol. He just wanted to notify Dr. Anne Hahn  because he has Lambert-Eaton myasthenic syndrome.

## 2013-03-02 NOTE — Telephone Encounter (Signed)
Diagnosis of diabetes and hypercholesterolemia is noted. I did not call the patient.

## 2013-03-13 DIAGNOSIS — IMO0001 Reserved for inherently not codable concepts without codable children: Secondary | ICD-10-CM | POA: Diagnosis not present

## 2013-03-13 DIAGNOSIS — I10 Essential (primary) hypertension: Secondary | ICD-10-CM | POA: Diagnosis not present

## 2013-03-13 DIAGNOSIS — E890 Postprocedural hypothyroidism: Secondary | ICD-10-CM | POA: Diagnosis not present

## 2013-03-16 ENCOUNTER — Other Ambulatory Visit: Payer: Self-pay | Admitting: Neurology

## 2013-03-17 NOTE — Telephone Encounter (Signed)
[phone].

## 2013-03-22 ENCOUNTER — Telehealth: Payer: Self-pay

## 2013-03-22 ENCOUNTER — Other Ambulatory Visit: Payer: Self-pay | Admitting: Neurology

## 2013-03-22 MED ORDER — HYDROCODONE-ACETAMINOPHEN 5-325 MG PO TABS: 1.0000 | ORAL_TABLET | Freq: Four times a day (QID) | ORAL | Status: DC | PRN

## 2013-03-22 NOTE — Telephone Encounter (Signed)
Called patient

## 2013-03-22 NOTE — Telephone Encounter (Signed)
Patient called requesting a refill of hydrocodone. Please call.

## 2013-03-22 NOTE — Telephone Encounter (Signed)
I called patient. He will come by tomorrow to pick up Rx

## 2013-04-19 ENCOUNTER — Other Ambulatory Visit: Payer: Self-pay | Admitting: *Deleted

## 2013-04-19 MED ORDER — HYDROCODONE-ACETAMINOPHEN 5-325 MG PO TABS: 1.0000 | ORAL_TABLET | Freq: Four times a day (QID) | ORAL | Status: DC | PRN

## 2013-04-20 NOTE — Telephone Encounter (Signed)
Called patient to inform him that his Rx was ready to be picked up. Patient verbalized understanding. °

## 2013-04-27 ENCOUNTER — Encounter: Payer: Self-pay | Admitting: Neurology

## 2013-04-27 ENCOUNTER — Ambulatory Visit (INDEPENDENT_AMBULATORY_CARE_PROVIDER_SITE_OTHER): Payer: Medicare Other | Admitting: Neurology

## 2013-04-27 VITALS — BP 126/69 | HR 58 | Wt 237.0 lb

## 2013-04-27 DIAGNOSIS — G7080 Lambert-Eaton syndrome, unspecified: Secondary | ICD-10-CM | POA: Diagnosis not present

## 2013-04-27 NOTE — Progress Notes (Signed)
Reason for visit: [name] is an 53 y.o. male  History of present illness:  [name] is a 53 year old right-handed white male with a history of [name]. The patient has had some issues with depression, and fatigue as well. The patient has had some blurring of vision and some occasional double vision. The patient feels as if his vision at times is "twitching". The patient is back on Celexa taking 40 mg daily. The patient was unable to tolerate Wellbutrin secondary to severe nausea. The patient indicates that his peripheral strength is good. The patient denies any problems with speech or swallowing. The patient returns to the office today for an evaluation. The patient is on Inderal taking 40 mg twice daily. The patient claims that he was placed on a medication when he was having problems with hyperthyroidism, but he was never taken off the medication when his thyroid became controlled. The patient recently was diagnosed with diabetes and dyslipidemia.  Past Medical History  Diagnosis Date  . Depression   . Back pain   . Lambert-Eaton myasthenic syndrome     Followed at [facility]. and Dr. Floyde Parkins  . QT interval     Question prolongation QT interval, October, 2012, review of EKG dated February 04, 2011, QT interval was not prolonged  . Hyperthyroidism     November, 201 to  . Pre-syncope     February 23, 2011  . Murmur     Systolic murmur, December, 2012,No valvular abnormalities, echo, December, 2012  . Anomalous coronary artery origin     The patient underwent cardiac catheterization in 2004.  Coronaries revealed no stenosis.  However he had aberrant takeoff of both the right and left coronaries from the anterior aortic cusp.  There was no evidence of renal artery stenosis  . Ejection fraction     EF 60-65%, echo, March 18, 2011  . Obesity   . Hypertension   . Dyslipidemia   . H/O radioactive iodine thyroid ablation   . Diabetes  mellitus without complication   . Dyslipidemia     Past Surgical History  Procedure Laterality Date  . Knee surgery Right   . Tonsillectomy    . Portacath placement      Family History  Problem Relation Age of Onset  . Hypertension Mother   . Cirrhosis Father   . Cirrhosis Maternal Grandmother     non alcoholic  . Heart disease    . Stroke    . Cancer      Social history:  reports that he has never smoked. He has never used smokeless tobacco. He reports that he does not drink alcohol or use illicit drugs.    Allergies  Allergen Reactions  . Toradol [Ketorolac Tromethamine]   . Vioxx [Rofecoxib]   . Rituximab   . Statins     Statins medications results in myalgias    Medications:  Current Outpatient Prescriptions on File Prior to Visit  Medication Sig Dispense Refill  . Diaminopyridine POWD MEDICATION IS 10MG  TABLETS....IT IS NOT POWDER....TAKES 3 TABLETS  IN THE MORNING FOR A 30MG  DOSAGE, 2 TABLETS MIDDAY FOR A  20MG  DOSAGE  AND 3 TABLETS AT NIGHT FOR A 30MG  DOSAGE.      . fish oil-omega-3 fatty acids 1000 MG capsule Take 1 g by mouth 3 (three) times daily.        Marland Kitchen glucose blood (ONE TOUCH ULTRA TEST) test strip 1 each by Other route 3 (  three) times daily.  100 each  3  . HYDROcodone-acetaminophen (NORCO/VICODIN) 5-325 MG per tablet Take 1 tablet by mouth every 6 (six) hours as needed.  60 tablet  0  . lisinopril (PRINIVIL,ZESTRIL) 5 MG tablet Take 1 tablet (5 mg total) by mouth daily.  30 tablet  3  . metFORMIN (GLUCOPHAGE) 500 MG tablet Take 1 tablet (500 mg total) by mouth 2 (two) times daily with a meal.  60 tablet  2  . Multiple Vitamins-Minerals (ONE-A-DAY MENS 50+ ADVANTAGE PO) Take 1 tablet by mouth daily.        Glory Rosebush DELICA LANCETS FINE MISC 1 each by Does not apply route 3 (three) times daily.  100 each  1  . pyridostigmine (MESTINON) 60 MG tablet Take 30 mg by mouth 3 (three) times daily.        Marland Kitchen zolpidem (AMBIEN) 10 MG tablet TAKE 1 TABLET AT BEDTIME   30 tablet  5   No current facility-administered medications on file prior to visit.    ROS:  Out of a complete 14 system review of symptoms, the patient complains only of the following symptoms, and all other reviewed systems are negative.  Activity change Light sensitivity, double vision Heat intolerance, flushing Swollen abdomen Insomnia, daytime sleepiness Back pain Headache, weakness Agitation, depression, anxiety  Blood pressure 126/69, pulse 58, weight 237 lb (107.502 kg).  Physical Exam  General: The patient is alert and cooperative at the time of the examination. The patient is moderately obese.  Skin: No significant peripheral edema is noted.   Neurologic Exam  Mental status: The patient is oriented x 3.  Cranial nerves: Facial symmetry is not present. Slight ptosis is seen on the left. Speech is normal, no aphasia or dysarthria is noted. Extraocular movements are full. Visual fields are full.  Motor: The patient has good strength in all 4 extremities.  Sensory examination: Soft touch sensation is symmetric on the face, arms, and legs.  Coordination: The patient has good finger-nose-finger and heel-to-shin bilaterally.  Gait and station: The patient has a normal gait. Tandem gait is normal. Romberg is negative. No drift is seen.  Reflexes: Deep tendon reflexes are symmetric.   Assessment/Plan:  1. [name]  2. Depression and anxiety  The patient is on Inderal which may be detrimental to his depression issues. The patient will be tapered off of the Inderal slowly. The patient will continue his Celexa. The patient is doing well with his peripheral strength. The patient followup in 6 months.  Jill Alexanders MD 04/27/2013 8:46 PM  [facility] [address] [place], [place] [address]  Phone [phone] Fax [phone]

## 2013-04-27 NOTE — Patient Instructions (Addendum)
With the inderal (propranolol) begin 1/2 tablet twice a day for 2 weeks, then take 1/2 tablet daily for 2 weeks, then stop the medication.Lambert-Eaton Myasthenic Syndrome Lambert-Eaton Myasthenic Syndrome (LEMS) is a disorder of the neuromuscular junction (the site where nerve cells meet muscle cells and help activate the muscles). It is caused by a disruption of electrical impulses between these nerve and muscle cells. LEMS is an autoimmune condition. In such disorders the immune system, which normally protects the body from foreign organisms, mistakenly attacks the body's own tissues. The disruption of electrical impulses is associated with antibodies produced as a consequence of this autoimmunity. Symptoms include muscle weakness, a tingling sensation (feeling) in the affected areas, fatigue, and dry mouth. LEMS is closely associated with cancer, in particular small cell lung cancer. More than half the individuals diagnosed with LEMS also develop small cell lung cancer. LEMS may appear up to 3 years before cancer is diagnosed. TREATMENT  There is no cure for LEMS. Treatment is directed at decreasing the autoimmune response (through the use of steroids, plasmapheresis, or high-dose intravenous immunoglobulin) or improving the transmission of the disrupted electrical impulses by giving drugs such as di-amino pyridine or pyridostigmine bromide (Mestinon). For patients with small cell lung cancer, treatment of the cancer is the first priority. Document Released: 03/20/2002 Document Revised: 06/22/2011 Document Reviewed: 03/24/2008 [facility] Patient Information 2014 [place], [place].

## 2013-05-17 ENCOUNTER — Other Ambulatory Visit: Payer: Self-pay | Admitting: Neurology

## 2013-05-17 MED ORDER — HYDROCODONE-ACETAMINOPHEN 5-325 MG PO TABS: 1.0000 | ORAL_TABLET | Freq: Four times a day (QID) | ORAL | Status: DC | PRN

## 2013-05-17 NOTE — Telephone Encounter (Signed)
Called patient and left message concerning his Rx being ready to be picked up at the front desk and if he has any other problems, questions or concerns to call the office. °

## 2013-05-17 NOTE — Telephone Encounter (Signed)
Patient needs a written prescription for hydrocodone. Please call to advise.

## 2013-05-17 NOTE — Telephone Encounter (Signed)
Patient requesting refill. Please advise.

## 2013-06-09 ENCOUNTER — Telehealth: Payer: Self-pay | Admitting: Neurology

## 2013-06-09 NOTE — Telephone Encounter (Signed)
Patient calling to request hydrocodone refill.  °

## 2013-06-09 NOTE — Telephone Encounter (Signed)
I called the patient back.  Explained Rx was a bit too soon and we would prescribe it on March 4th and each fill needs to last 28 days.  Patient verbalized understanding.

## 2013-06-09 NOTE — Telephone Encounter (Signed)
Patient is requesting a refill on Hydrocodone.  Rx for #60 was last written on 02/04.  Would you like to prescribe at this time?  Please advise.  Thank you.

## 2013-06-09 NOTE — Telephone Encounter (Signed)
This prescription is not due until March 4. This prescription must last 28 days. This is not written on the prescription, I will place this note in the computer.

## 2013-06-12 DIAGNOSIS — I10 Essential (primary) hypertension: Secondary | ICD-10-CM | POA: Diagnosis not present

## 2013-06-12 DIAGNOSIS — IMO0001 Reserved for inherently not codable concepts without codable children: Secondary | ICD-10-CM | POA: Diagnosis not present

## 2013-06-12 DIAGNOSIS — E890 Postprocedural hypothyroidism: Secondary | ICD-10-CM | POA: Diagnosis not present

## 2013-06-14 ENCOUNTER — Other Ambulatory Visit: Payer: Self-pay

## 2013-06-14 MED ORDER — HYDROCODONE-ACETAMINOPHEN 5-325 MG PO TABS: 1.0000 | ORAL_TABLET | Freq: Four times a day (QID) | ORAL | Status: DC | PRN

## 2013-06-14 NOTE — Telephone Encounter (Signed)
Called patient to inform him the his Rx was ready to be picked up and if he has any other problems, questions or concerns to call the office. Patient verbalized understanding.

## 2013-06-15 DIAGNOSIS — E890 Postprocedural hypothyroidism: Secondary | ICD-10-CM | POA: Diagnosis not present

## 2013-06-15 DIAGNOSIS — I10 Essential (primary) hypertension: Secondary | ICD-10-CM | POA: Diagnosis not present

## 2013-06-15 DIAGNOSIS — IMO0001 Reserved for inherently not codable concepts without codable children: Secondary | ICD-10-CM | POA: Diagnosis not present

## 2013-06-15 DIAGNOSIS — E7800 Pure hypercholesterolemia, unspecified: Secondary | ICD-10-CM | POA: Diagnosis not present

## 2013-06-29 ENCOUNTER — Ambulatory Visit: Payer: Medicare Other

## 2013-06-30 ENCOUNTER — Other Ambulatory Visit: Payer: Self-pay | Admitting: Family Medicine

## 2013-07-06 ENCOUNTER — Ambulatory Visit: Payer: Medicare Other

## 2013-07-12 ENCOUNTER — Other Ambulatory Visit: Payer: Self-pay | Admitting: Neurology

## 2013-07-12 MED ORDER — HYDROCODONE-ACETAMINOPHEN 5-325 MG PO TABS: 1.0000 | ORAL_TABLET | Freq: Four times a day (QID) | ORAL | Status: DC | PRN

## 2013-07-12 NOTE — Telephone Encounter (Signed)
Called pt to inform him that his Rx was ready to be picked up at the front desk and if he has any other problems, questions or concerns to call the office. Pt verbalized understanding. °

## 2013-07-12 NOTE — Telephone Encounter (Signed)
Pt called and stated he needed his HYDROcodone-acetaminophen (NORCO/VICODIN) 5-325 MG per tablet refilled. Please call Pt when ready.  He asked if it would be possible for it to be ready by tomorrow as he will already be in [place].  Thank you

## 2013-07-13 ENCOUNTER — Ambulatory Visit: Payer: Medicare Other

## 2013-07-24 ENCOUNTER — Telehealth: Payer: Self-pay | Admitting: Neurology

## 2013-07-24 MED ORDER — ALPRAZOLAM 0.5 MG PO TABS: 0.5000 mg | ORAL_TABLET | Freq: Three times a day (TID) | ORAL | Status: DC | PRN

## 2013-07-24 NOTE — Telephone Encounter (Signed)
Pt is having panic attacks, just had a real bad one. Pt needs Dr. Jannifer Franklin or nurse to call him back concerning this matter. Thanks
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
## 2013-07-24 NOTE — Telephone Encounter (Signed)
I called patient. The patient has come off of the Inderal, mainly because he is having problems with depression. Now he is off of Inderal, and he is having anxiety episodes, panic attacks. The patient is under a lot of stress recently. The patient may go a week between panic attacks. I'll give him a prescription for alprazolam to take if needed.

## 2013-08-10 ENCOUNTER — Other Ambulatory Visit: Payer: Self-pay | Admitting: Neurology

## 2013-08-10 ENCOUNTER — Other Ambulatory Visit: Payer: Self-pay | Admitting: *Deleted

## 2013-08-10 MED ORDER — HYDROCODONE-ACETAMINOPHEN 5-325 MG PO TABS: 1.0000 | ORAL_TABLET | Freq: Four times a day (QID) | ORAL | Status: DC | PRN

## 2013-08-10 NOTE — Telephone Encounter (Signed)
Called pt and left message informing him that his Rx was ready to be picked up at the front desk and if he has any other problems, questions or concerns to call the office. °

## 2013-08-10 NOTE — Telephone Encounter (Signed)
Rx signed and faxed.

## 2013-08-10 NOTE — Telephone Encounter (Signed)
Request has been forwarded to provider for approval  

## 2013-08-10 NOTE — Telephone Encounter (Signed)
Patient calling to rx refill of HYDROcodone-acetaminophen (NORCO/VICODIN) 5-325 MG per tablet.  Thanks

## 2013-08-28 ENCOUNTER — Other Ambulatory Visit: Payer: Self-pay | Admitting: Neurology

## 2013-08-29 NOTE — Telephone Encounter (Signed)
Rx signed and faxed.

## 2013-09-07 ENCOUNTER — Other Ambulatory Visit: Payer: Self-pay | Admitting: Neurology

## 2013-09-07 ENCOUNTER — Other Ambulatory Visit: Payer: Self-pay | Admitting: Family Medicine

## 2013-09-07 MED ORDER — HYDROCODONE-ACETAMINOPHEN 5-325 MG PO TABS: 1.0000 | ORAL_TABLET | Freq: Four times a day (QID) | ORAL | Status: DC | PRN

## 2013-09-07 NOTE — Telephone Encounter (Signed)
Patient is requesting refills of hydrocodone refill - please call when ready.

## 2013-09-07 NOTE — Telephone Encounter (Signed)
Called pt to inform him that his Rx was ready to be picked up at the front desk and if he has any other problems, questions or concerns to call the office. Pt verbalized understanding. °

## 2013-09-07 NOTE — Telephone Encounter (Signed)
Request forwarded to provider for approval  

## 2013-09-08 NOTE — Telephone Encounter (Signed)
Last seen 02/28/13  Dr Ernestina Patches

## 2013-09-11 ENCOUNTER — Other Ambulatory Visit: Payer: Self-pay | Admitting: Family Medicine

## 2013-09-11 MED ORDER — LISINOPRIL 5 MG PO TABS: ORAL_TABLET | ORAL | Status: DC

## 2013-09-14 DIAGNOSIS — E890 Postprocedural hypothyroidism: Secondary | ICD-10-CM | POA: Diagnosis not present

## 2013-09-25 ENCOUNTER — Other Ambulatory Visit: Payer: Self-pay | Admitting: Neurology

## 2013-09-26 NOTE — Telephone Encounter (Signed)
Rx signed and faxed.

## 2013-10-04 ENCOUNTER — Other Ambulatory Visit: Payer: Self-pay | Admitting: Neurology

## 2013-10-04 ENCOUNTER — Telehealth: Payer: Self-pay | Admitting: Neurology

## 2013-10-04 MED ORDER — HYDROCODONE-ACETAMINOPHEN 5-325 MG PO TABS: 1.0000 | ORAL_TABLET | Freq: Four times a day (QID) | ORAL | Status: DC | PRN

## 2013-10-04 NOTE — Telephone Encounter (Signed)
Patient said that his surgery is scheduled for July 2nd,has a scheduled appt.with Dr Jannifer Franklin on July 16th. Patient said that he was ok with seeing a NP.

## 2013-10-04 NOTE — Telephone Encounter (Signed)
Okay to see [name].

## 2013-10-04 NOTE — Telephone Encounter (Signed)
Patient requesting refill of  HYDROcodone-acetaminophen (NORCO/VICODIN) 5-325 MG per tablet.  Will be in [place] tomorrow and would like to pick up.  Thanks

## 2013-10-04 NOTE — Telephone Encounter (Signed)
Patient requesting medical clearance for major dental work.  Please call and advise.

## 2013-10-04 NOTE — Telephone Encounter (Signed)
Request forwarded to Shawana Knoch for approval  

## 2013-10-04 NOTE — Telephone Encounter (Signed)
Called pt to inform him that his Rx was ready to be picked up at the front desk and if he has any other problems, questions or concerns to call the office. Pt verbalized understanding. °

## 2013-10-05 NOTE — Telephone Encounter (Signed)
Patient has been scheduled for sooner appt

## 2013-10-11 ENCOUNTER — Encounter: Payer: Self-pay | Admitting: Adult Health

## 2013-10-11 ENCOUNTER — Ambulatory Visit (INDEPENDENT_AMBULATORY_CARE_PROVIDER_SITE_OTHER): Payer: Medicare Other | Admitting: Adult Health

## 2013-10-11 VITALS — BP 116/74 | HR 51 | Wt 221.1 lb

## 2013-10-11 DIAGNOSIS — G7080 Lambert-Eaton syndrome, unspecified: Secondary | ICD-10-CM | POA: Diagnosis not present

## 2013-10-11 DIAGNOSIS — G479 Sleep disorder, unspecified: Secondary | ICD-10-CM

## 2013-10-11 MED ORDER — ESZOPICLONE 1 MG PO TABS: 1.0000 mg | ORAL_TABLET | Freq: Every evening | ORAL | Status: DC | PRN

## 2013-10-11 NOTE — Progress Notes (Signed)
PATIENT: [name] DOB: 22-Apr-1960  REASON FOR VISIT: follow up HISTORY FROM: patient  HISTORY OF PRESENT ILLNESS:  [name] is a 53 year old male with a history of [name]. He returns today for an evaluation.He is currently taking mestinon and is tolerating it well. The patient also takes celexa and reports that it has been the "best medication" he has tired. The patient was having panic attacks and was given xanax. He reports that this has helped tremendously. Patient also takes Hydrocodone for chronic back pain. He sometimes does have blurry vision and double vision but that has improved since he changed his contact prescription. He denies problems with speech or swallowing. Patient states that he fell 2 weeks ago down steps due to his legs being weak. He reports that he is not sleeping well. He uses Ambien and will fall a sleep but wakes up at 3 am and can not go back to sleep. Patient denies snoring. This causes him to be fatigued during the day. Patient states that he is under a lot of stress right now and when he wakes up during the night his mind is racing. His daughter is having some issues at school. He is losing his part time job and he has had a lot of unexpected expenses in the last couple of weeks. Patient has been on lunesta in the past and states that it worked better than Ambien. He had to stop it due to cost but now he has a Adult nurse. Patient reports that he is having four teeth removed due to them being abscessed. Patient was able to come off all diabetic medication due to weight loss and diet control.   REVIEW OF SYSTEMS: Full 14 system review of systems performed and notable only for:  Constitutional: Activity change, fatigue Eyes: Double vision  Ear/Nose/Throat:  trouble swallowing Skin: N/A  Cardiovascular: N/A  Respiratory: N/A  Gastrointestinal: N/A  Genitourinary: N/A Hematology/Lymphatic: N/A  Endocrine:  heat intolerance,  excessive thirst, flushing  Musculoskeletal: Back pain, aching muscles, muscle cramps  Allergy/Immunology: N/A  Neurological:  numbness, weakness Psychiatric:  depression, nervous/anxious  Sleep:  insomnia, daytime sleepiness    ALLERGIES: Allergies  Allergen Reactions  . Toradol [Ketorolac Tromethamine]   . Vioxx [Rofecoxib]   . Rituximab   . Statins     Statins medications results in myalgias    HOME MEDICATIONS: Outpatient Prescriptions Prior to Visit  Medication Sig Dispense Refill  . ALPRAZolam (XANAX) 0.5 MG tablet TAKE 1 TABLET OP 3 TIMES A DAY AS NEEDED FOR ANXIETY  30 tablet  3  . citalopram (CELEXA) 40 MG tablet Take 40 mg by mouth daily.      . Diaminopyridine POWD MEDICATION IS 10MG  TABLETS....IT IS NOT POWDER....TAKES 3 TABLETS  IN THE MORNING FOR A 30MG  DOSAGE, 2 TABLETS MIDDAY FOR A  20MG  DOSAGE  AND 3 TABLETS AT NIGHT FOR A 30MG  DOSAGE.      . fish oil-omega-3 fatty acids 1000 MG capsule Take 1 g by mouth 3 (three) times daily.        Marland Kitchen glucose blood (ONE TOUCH ULTRA TEST) test strip 1 each by Other route 3 (three) times daily.  100 each  3  . HYDROcodone-acetaminophen (NORCO/VICODIN) 5-325 MG per tablet Take 1 tablet by mouth every 6 (six) hours as needed. Must last 28 days.  60 tablet  0  . levothyroxine (SYNTHROID, LEVOTHROID) 175 MCG tablet Take 175 mcg by mouth daily before breakfast.      .  lisinopril (PRINIVIL,ZESTRIL) 5 MG tablet TAKE 1 TABLET (5 MG TOTAL) BY MOUTH DAILY.  30 tablet  0  . metFORMIN (GLUCOPHAGE) 500 MG tablet Take 1 tablet (500 mg total) by mouth 2 (two) times daily with a meal.  60 tablet  2  . Multiple Vitamins-Minerals (ONE-A-DAY MENS 50+ ADVANTAGE PO) Take 1 tablet by mouth daily.        Glory Rosebush DELICA LANCETS FINE MISC 1 each by Does not apply route 3 (three) times daily.  100 each  1  . pyridostigmine (MESTINON) 60 MG tablet Take 30 mg by mouth 3 (three) times daily.        Marland Kitchen zolpidem (AMBIEN) 10 MG tablet TAKE 1 TABLET BY MOUTH AT  BEDTIME  30 tablet  5   No facility-administered medications prior to visit.    PAST MEDICAL HISTORY: Past Medical History  Diagnosis Date  . Depression   . Back pain   . Lambert-Eaton myasthenic syndrome     Followed at [facility]. and Dr. Floyde Parkins  . QT interval     Question prolongation QT interval, October, 2012, review of EKG dated February 04, 2011, QT interval was not prolonged  . Hyperthyroidism     November, 201 to  . Pre-syncope     February 23, 2011  . Murmur     Systolic murmur, December, 2012,No valvular abnormalities, echo, December, 2012  . Anomalous coronary artery origin     The patient underwent cardiac catheterization in 2004.  Coronaries revealed no stenosis.  However he had aberrant takeoff of both the right and left coronaries from the anterior aortic cusp.  There was no evidence of renal artery stenosis  . Ejection fraction     EF 60-65%, echo, March 18, 2011  . Obesity   . Hypertension   . Dyslipidemia   . H/O radioactive iodine thyroid ablation   . Diabetes mellitus without complication   . Dyslipidemia     PAST SURGICAL HISTORY: Past Surgical History  Procedure Laterality Date  . Knee surgery Right   . Tonsillectomy    . Portacath placement      FAMILY HISTORY: Family History  Problem Relation Age of Onset  . Hypertension Mother   . Cirrhosis Father   . Cirrhosis Maternal Grandmother     non alcoholic  . Heart disease    . Stroke    . Cancer      SOCIAL HISTORY: History   Social History  . Marital Status: Married    Spouse Name: N/A    Number of Children: 2  . Years of Education: 16   Occupational History  . police dispatcher   .     Social History Main Topics  . Smoking status: Never Smoker   . Smokeless tobacco: Never Used  . Alcohol Use: No  . Drug Use: No  . Sexual Activity: Yes   Other Topics Concern  . Not on file   Social History Narrative  . No narrative on file      PHYSICAL EXAM  Filed Vitals:    10/11/13 1132  BP: 116/74  Pulse: 51  Weight: 221 lb 1.6 oz (100.29 kg)   Body mass index is 30.85 kg/(m^2).  Generalized: Well developed, in no acute distress   Neurological examination  Mentation: Alert oriented to time, place, history taking. Follows all commands speech and language fluent Cranial nerve II-XII: . Extraocular movements were full, visual field were full on confrontational test.  Motor: The motor testing  reveals 5 over 5 strength of all 4 extremities. Good symmetric motor tone is noted throughout.  Sensory: Sensory testing is intact to soft touch on all 4 extremities. No evidence of extinction is noted.  Coordination: Cerebellar testing reveals good finger-nose-finger and heel-to-shin bilaterally.  Gait and station: Gait is normal.  Reflexes: Deep tendon reflexes are absent.     DIAGNOSTIC DATA (LABS, IMAGING, TESTING) - I reviewed patient records, labs, notes, testing and imaging myself where available.  Lab Results  Component Value Date   WBC 7.3 12/16/2012   HGB 15.9 12/16/2012   HCT 48.7 12/16/2012   MCV 90.9 12/16/2012   PLT 157 02/23/2011      Component Value Date/Time   NA 137 02/28/2013 0927   NA 138 02/23/2011 2140   K 4.1 02/28/2013 0927   CL 94* 02/28/2013 0927   CO2 25 02/28/2013 0927   GLUCOSE 182* 02/28/2013 0927   GLUCOSE 137* 02/23/2011 2140   BUN 12 02/28/2013 0927   BUN 9 02/23/2011 2140   CREATININE 1.02 02/28/2013 0927   CALCIUM 9.3 02/28/2013 0927   PROT 6.9 02/28/2013 0927   PROT 6.9 03/16/2007 1110   ALBUMIN 3.8 03/16/2007 1110   AST 55* 02/28/2013 0927   ALT 45* 02/28/2013 0927   ALKPHOS 77 02/28/2013 0927   BILITOT 0.5 02/28/2013 0927   GFRNONAA 85 02/28/2013 0927   GFRAA 99 02/28/2013 0927   Lab Results  Component Value Date   HDL 35* 02/28/2013   LDLCALC 173* 02/28/2013   TRIG 306* 02/28/2013   CHOLHDL 7.7* 02/28/2013   Lab Results  Component Value Date   HGBA1C 7.3% 02/28/2013   No results found for this basename:  VITAMINB12   Lab Results  Component Value Date   TSH 7.520* 02/28/2013      ASSESSMENT AND PLAN 53 y.o. year old male  has a past medical history of Depression; Back pain; Lambert-Eaton myasthenic syndrome; QT interval; Hyperthyroidism; Pre-syncope; Murmur; Anomalous coronary artery origin; Ejection fraction; Obesity; Hypertension; Dyslipidemia; H/O radioactive iodine thyroid ablation; Diabetes mellitus without complication; and Dyslipidemia. here with:  1. Lambert-Eaton myasthenic syndrome  Patient has remained stable. He is also being seen at the [facility]. Patient reports that Celexa and Xanax had been working well for him. He states that the panic attacks have been controlled with Xanax. His only complaint today is not sleeping well at night. He reports that he takes Ambien and will fall asleep but usually wakes up around 3 AM and is unable to fall back asleep. He states that this is usually due to his mind racing. Patient reports that he has been under a lot of stress in the last few months. Patient has tried [place] in the past and reports that it worked better than the Ambien however he had to stop it due to the cost. Patient has since changed insurance plans and would like to try  Lunesta again. Patient should stop taking Ambien. I will order 1 mg of Lunesta at bedtime as needed. Patient should let us know if this dose is not effective. From our standpoint patient is cleared to have dental surgery. Patient should followup in 6 months or sooner if needed.  Ward Givens, MSN, NP-C 10/11/2013, 11:36 AM [facility] [address], [place],  [address] [phone]  Note: This document was prepared with digital dictation and possible smart phrase technology. Any transcriptional errors that result from this process are unintentional.

## 2013-10-11 NOTE — Patient Instructions (Signed)
Lambert-Eaton Myasthenic Syndrome Lambert-Eaton Myasthenic Syndrome (LEMS) is a disorder of the neuromuscular junction (the site where nerve cells meet muscle cells and help activate the muscles). It is caused by a disruption of electrical impulses between these nerve and muscle cells. LEMS is an autoimmune condition. In such disorders the immune system, which normally protects the body from foreign organisms, mistakenly attacks the body's own tissues. The disruption of electrical impulses is associated with antibodies produced as a consequence of this autoimmunity. Symptoms include muscle weakness, a tingling sensation (feeling) in the affected areas, fatigue, and dry mouth. LEMS is closely associated with cancer, in particular small cell lung cancer. More than half the individuals diagnosed with LEMS also develop small cell lung cancer. LEMS may appear up to 3 years before cancer is diagnosed. TREATMENT  There is no cure for LEMS. Treatment is directed at decreasing the autoimmune response (through the use of steroids, plasmapheresis, or high-dose intravenous immunoglobulin) or improving the transmission of the disrupted electrical impulses by giving drugs such as di-amino pyridine or pyridostigmine bromide (Mestinon). For patients with small cell lung cancer, treatment of the cancer is the first priority. Document Released: 03/20/2002 Document Revised: 06/22/2011 Document Reviewed: 03/24/2008 [facility] Patient Information 2015 [place], [place]. This information is not intended to replace advice given to you by your health care provider. Make sure you discuss any questions you have with your health care provider.

## 2013-10-11 NOTE — Progress Notes (Signed)
I have read the note, and I agree with the clinical assessment and plan.  Javad Salva KEITH   

## 2013-10-26 ENCOUNTER — Ambulatory Visit: Payer: Medicare Other | Admitting: Neurology

## 2013-11-02 ENCOUNTER — Other Ambulatory Visit: Payer: Self-pay | Admitting: Neurology

## 2013-11-02 MED ORDER — HYDROCODONE-ACETAMINOPHEN 5-325 MG PO TABS: 1.0000 | ORAL_TABLET | Freq: Four times a day (QID) | ORAL | Status: DC | PRN

## 2013-11-02 NOTE — Telephone Encounter (Signed)
Patient requesting Rx refill for  HYDROcodone-acetaminophen (NORCO/VICODIN) 5-325 MG per tablet.  Please call when ready for pick up.  Thanks

## 2013-11-02 NOTE — Telephone Encounter (Signed)
Called pt to inform him that his Rx was ready to be picked up at the front desk and if he has any other problems, questions or concerns to call the office. Pt verbalized understanding. °

## 2013-11-02 NOTE — Telephone Encounter (Signed)
Request has been sent to provider for approval 

## 2013-11-13 ENCOUNTER — Other Ambulatory Visit: Payer: Self-pay | Admitting: Neurology

## 2013-11-14 NOTE — Telephone Encounter (Signed)
Rx signed and faxed.

## 2013-11-15 ENCOUNTER — Other Ambulatory Visit: Payer: Self-pay | Admitting: Family Medicine

## 2013-12-06 ENCOUNTER — Other Ambulatory Visit: Payer: Self-pay | Admitting: Adult Health

## 2013-12-06 MED ORDER — HYDROCODONE-ACETAMINOPHEN 5-325 MG PO TABS: 1.0000 | ORAL_TABLET | Freq: Four times a day (QID) | ORAL | Status: DC | PRN

## 2013-12-06 NOTE — Telephone Encounter (Signed)
Patient requesting Rx refill for HYDROcodone-acetaminophen (NORCO/VICODIN) 5-325 MG per tablet.  Patient requesting Rx mailed to home.  Please call anytime and may leave message if not available.

## 2013-12-06 NOTE — Telephone Encounter (Signed)
Rx entered; request forwarded to provider for approval

## 2013-12-07 NOTE — Telephone Encounter (Signed)
Pt's Rx was mailed out today, per Janett Billow, CPhT.

## 2013-12-15 DIAGNOSIS — E7800 Pure hypercholesterolemia, unspecified: Secondary | ICD-10-CM | POA: Diagnosis not present

## 2013-12-15 DIAGNOSIS — E890 Postprocedural hypothyroidism: Secondary | ICD-10-CM | POA: Diagnosis not present

## 2013-12-15 DIAGNOSIS — IMO0001 Reserved for inherently not codable concepts without codable children: Secondary | ICD-10-CM | POA: Diagnosis not present

## 2013-12-20 DIAGNOSIS — E7800 Pure hypercholesterolemia, unspecified: Secondary | ICD-10-CM | POA: Diagnosis not present

## 2013-12-20 DIAGNOSIS — IMO0001 Reserved for inherently not codable concepts without codable children: Secondary | ICD-10-CM | POA: Diagnosis not present

## 2013-12-20 DIAGNOSIS — I10 Essential (primary) hypertension: Secondary | ICD-10-CM | POA: Diagnosis not present

## 2013-12-20 DIAGNOSIS — E890 Postprocedural hypothyroidism: Secondary | ICD-10-CM | POA: Diagnosis not present

## 2013-12-21 DIAGNOSIS — R5383 Other fatigue: Secondary | ICD-10-CM | POA: Diagnosis not present

## 2013-12-21 DIAGNOSIS — R5381 Other malaise: Secondary | ICD-10-CM | POA: Diagnosis not present

## 2013-12-27 ENCOUNTER — Other Ambulatory Visit: Payer: Self-pay | Admitting: Neurology

## 2013-12-28 NOTE — Telephone Encounter (Signed)
Per 07/01 notes

## 2014-01-01 DIAGNOSIS — E291 Testicular hypofunction: Secondary | ICD-10-CM | POA: Diagnosis not present

## 2014-01-10 ENCOUNTER — Other Ambulatory Visit: Payer: Self-pay | Admitting: Neurology

## 2014-01-10 MED ORDER — HYDROCODONE-ACETAMINOPHEN 5-325 MG PO TABS: 1.0000 | ORAL_TABLET | Freq: Four times a day (QID) | ORAL | Status: DC | PRN

## 2014-01-10 NOTE — Telephone Encounter (Signed)
Request forwarded to provider for approval  

## 2014-01-10 NOTE — Telephone Encounter (Signed)
Patient is calling to get a written Rx for Hydrocodone. Please call patient when ready for pickup. Thank you. °

## 2014-01-11 NOTE — Telephone Encounter (Signed)
Called pt to inform him that his Rx was ready to be picked up at the front desk and if he has any other problems, questions or concerns to call the office. Pt verbalized understanding. °

## 2014-02-06 ENCOUNTER — Other Ambulatory Visit: Payer: Self-pay | Admitting: Neurology

## 2014-02-08 ENCOUNTER — Other Ambulatory Visit: Payer: Self-pay | Admitting: Neurology

## 2014-02-08 MED ORDER — HYDROCODONE-ACETAMINOPHEN 5-325 MG PO TABS: 1.0000 | ORAL_TABLET | Freq: Four times a day (QID) | ORAL | Status: DC | PRN

## 2014-02-08 NOTE — Telephone Encounter (Signed)
I called the patient to let them know their Rx for Hydrocodone was ready for pickup. Patient was instructed to bring Photo ID. 

## 2014-02-08 NOTE — Telephone Encounter (Signed)
Patient is calling to get a written Rx for Hydrocodone. Please call patient when ready for pickup. Thank you. °

## 2014-02-08 NOTE — Telephone Encounter (Signed)
Request entered, forwarded to provider for approval.  

## 2014-02-19 ENCOUNTER — Other Ambulatory Visit: Payer: Self-pay | Admitting: Neurology

## 2014-02-19 NOTE — Telephone Encounter (Signed)
Rx signed and faxed.

## 2014-02-21 DIAGNOSIS — Z79899 Other long term (current) drug therapy: Secondary | ICD-10-CM | POA: Diagnosis not present

## 2014-02-21 DIAGNOSIS — I10 Essential (primary) hypertension: Secondary | ICD-10-CM | POA: Diagnosis not present

## 2014-02-21 DIAGNOSIS — G7080 Lambert-Eaton syndrome, unspecified: Secondary | ICD-10-CM | POA: Diagnosis not present

## 2014-02-21 DIAGNOSIS — M1990 Unspecified osteoarthritis, unspecified site: Secondary | ICD-10-CM | POA: Diagnosis not present

## 2014-02-21 DIAGNOSIS — E291 Testicular hypofunction: Secondary | ICD-10-CM | POA: Diagnosis not present

## 2014-02-21 DIAGNOSIS — Z23 Encounter for immunization: Secondary | ICD-10-CM | POA: Diagnosis not present

## 2014-03-12 ENCOUNTER — Other Ambulatory Visit: Payer: Self-pay | Admitting: Adult Health

## 2014-03-12 MED ORDER — HYDROCODONE-ACETAMINOPHEN 5-325 MG PO TABS: 1.0000 | ORAL_TABLET | Freq: Four times a day (QID) | ORAL | Status: DC | PRN

## 2014-03-12 NOTE — Telephone Encounter (Signed)
Request entered, forwarded to provider for approval.  

## 2014-03-12 NOTE — Telephone Encounter (Signed)
Patient requesting Rx refill for HYDROcodone-acetaminophen (NORCO/VICODIN) 5-325 MG per tablet.  Please call when ready for pick up. °

## 2014-03-12 NOTE — Telephone Encounter (Signed)
I called the patient to let them know their Rx for Hydrocodone was ready for pickup. Patient was instructed to bring Photo ID. 

## 2014-03-21 ENCOUNTER — Telehealth: Payer: Self-pay | Admitting: Adult Health

## 2014-03-21 MED ORDER — ESZOPICLONE 1 MG PO TABS: 2.0000 mg | ORAL_TABLET | Freq: Every evening | ORAL | Status: DC | PRN

## 2014-03-21 NOTE — Telephone Encounter (Signed)
Here's one that Lunesta doesn't work for.

## 2014-03-21 NOTE — Telephone Encounter (Signed)
I saw the patient. He states that the [name] is not working as well for him anymore he states over the weekend he probably got 3 hours of sleep each night. He states last night he was not able to fall asleep. I will increase his Lunesta to 2 mg at bedtime. He will let us know if this is effective.

## 2014-03-21 NOTE — Telephone Encounter (Signed)
Patient stated Rx eszopiclone (LUNESTA) 1 MG TABS tablet not working.  Please call home # [phone] until 5 today, after 5:00 call mobile # [phone].  Please call and advise.

## 2014-04-04 ENCOUNTER — Telehealth: Payer: Self-pay | Admitting: Adult Health

## 2014-04-04 DIAGNOSIS — S82831A Other fracture of upper and lower end of right fibula, initial encounter for closed fracture: Secondary | ICD-10-CM | POA: Diagnosis not present

## 2014-04-04 NOTE — Telephone Encounter (Signed)
I discussed this with Dr. Rexene Alberts. Ok to fill the additional prescription due to pain from his broken foot.

## 2014-04-04 NOTE — Telephone Encounter (Signed)
Pharmacy Daine Gravel with [facility].  Stated patient picked up Rx refill for HYDROcodone-acetaminophen (NORCO/VICODIN) 5-325 MG per tablet on 11/30.  Medication was to last for 28 days before next refill.  Patient went to Urgent Care for broken foot and received another Rx for HYDROcodone-acetaminophen (NORCO/VICODIN) 5-325 MG per tablet.  Questioning if its ok to fill.  Please call and advise.

## 2014-04-04 NOTE — Telephone Encounter (Signed)
I called back.  Pharmacy says the patient picked up the Hydrocodone we prescribed on 11/30 for #60.  He has apparently broken his foot and Urgent Care prescribed him an additional #20 tabs.  They would lik eto know if this Rx should be filled or not.  Please advise.  Thank you.

## 2014-04-04 NOTE — Telephone Encounter (Signed)
I called the pharmacy back.  Relayed message.  They are aware.

## 2014-04-05 DIAGNOSIS — S82401A Unspecified fracture of shaft of right fibula, initial encounter for closed fracture: Secondary | ICD-10-CM | POA: Diagnosis not present

## 2014-04-08 DIAGNOSIS — S82401A Unspecified fracture of shaft of right fibula, initial encounter for closed fracture: Secondary | ICD-10-CM | POA: Diagnosis not present

## 2014-04-11 ENCOUNTER — Other Ambulatory Visit: Payer: Self-pay | Admitting: Neurology

## 2014-04-11 MED ORDER — HYDROCODONE-ACETAMINOPHEN 5-325 MG PO TABS: 1.0000 | ORAL_TABLET | Freq: Four times a day (QID) | ORAL | Status: DC | PRN

## 2014-04-11 NOTE — Telephone Encounter (Signed)
Request entered, forwarded to provider for approval.  

## 2014-04-11 NOTE — Telephone Encounter (Signed)
Patient is calling to get a written Rx for Hydrocodone. Please call patient when ready for pickup. Thank you.

## 2014-04-11 NOTE — Telephone Encounter (Addendum)
I called the patient to let them know their Rx for Hydrocodone was ready for pickup. Patient was instructed to bring Photo ID.  [name] DOB [birth year], will be picking up the Rx.

## 2014-04-12 DIAGNOSIS — S82401D Unspecified fracture of shaft of right fibula, subsequent encounter for closed fracture with routine healing: Secondary | ICD-10-CM | POA: Diagnosis not present

## 2014-04-16 ENCOUNTER — Ambulatory Visit: Payer: Medicare Other | Admitting: Adult Health

## 2014-04-17 DIAGNOSIS — S82401D Unspecified fracture of shaft of right fibula, subsequent encounter for closed fracture with routine healing: Secondary | ICD-10-CM | POA: Diagnosis not present

## 2014-04-26 DIAGNOSIS — S82401D Unspecified fracture of shaft of right fibula, subsequent encounter for closed fracture with routine healing: Secondary | ICD-10-CM | POA: Diagnosis not present

## 2014-04-26 DIAGNOSIS — S83242A Other tear of medial meniscus, current injury, left knee, initial encounter: Secondary | ICD-10-CM | POA: Diagnosis not present

## 2014-05-09 ENCOUNTER — Other Ambulatory Visit: Payer: Self-pay | Admitting: Neurology

## 2014-05-09 NOTE — Telephone Encounter (Signed)
Patient is calling to get a written Rx for Hydrocodone. Please call patient when ready for pickup. Patient will be in [facility] tomorrow. Thank you.

## 2014-05-10 DIAGNOSIS — S82401D Unspecified fracture of shaft of right fibula, subsequent encounter for closed fracture with routine healing: Secondary | ICD-10-CM | POA: Diagnosis not present

## 2014-05-10 DIAGNOSIS — S83242D Other tear of medial meniscus, current injury, left knee, subsequent encounter: Secondary | ICD-10-CM | POA: Diagnosis not present

## 2014-05-10 MED ORDER — HYDROCODONE-ACETAMINOPHEN 5-325 MG PO TABS: 1.0000 | ORAL_TABLET | Freq: Four times a day (QID) | ORAL | Status: DC | PRN

## 2014-05-10 NOTE — Telephone Encounter (Signed)
Patient requesting to pick up medication today due to being in [place].  Please call cell # [phone] and advise.

## 2014-05-10 NOTE — Telephone Encounter (Signed)
Request entered, forwarded to provider for approval.    Unfortunately, the original message was never forwarded to anyone.

## 2014-05-11 ENCOUNTER — Telehealth: Payer: Self-pay

## 2014-05-11 NOTE — Telephone Encounter (Signed)
Called patient and informed Rx ready for pick up at front desk. Patient verbalized understanding.  

## 2014-05-17 ENCOUNTER — Other Ambulatory Visit: Payer: Self-pay | Admitting: Neurology

## 2014-05-17 DIAGNOSIS — M1712 Unilateral primary osteoarthritis, left knee: Secondary | ICD-10-CM | POA: Diagnosis not present

## 2014-05-18 NOTE — Telephone Encounter (Signed)
Rx signed and faxed.

## 2014-05-22 ENCOUNTER — Ambulatory Visit: Payer: Medicare Other | Attending: Orthopedic Surgery | Admitting: Physical Therapy

## 2014-05-22 DIAGNOSIS — I10 Essential (primary) hypertension: Secondary | ICD-10-CM | POA: Diagnosis not present

## 2014-05-22 DIAGNOSIS — E785 Hyperlipidemia, unspecified: Secondary | ICD-10-CM | POA: Insufficient documentation

## 2014-05-22 DIAGNOSIS — E119 Type 2 diabetes mellitus without complications: Secondary | ICD-10-CM | POA: Diagnosis not present

## 2014-05-22 DIAGNOSIS — G7080 Lambert-Eaton syndrome, unspecified: Secondary | ICD-10-CM | POA: Diagnosis not present

## 2014-05-22 DIAGNOSIS — W19XXXD Unspecified fall, subsequent encounter: Secondary | ICD-10-CM | POA: Diagnosis not present

## 2014-05-22 DIAGNOSIS — S82831D Other fracture of upper and lower end of right fibula, subsequent encounter for closed fracture with routine healing: Secondary | ICD-10-CM | POA: Insufficient documentation

## 2014-05-22 DIAGNOSIS — E669 Obesity, unspecified: Secondary | ICD-10-CM | POA: Insufficient documentation

## 2014-05-27 ENCOUNTER — Other Ambulatory Visit: Payer: Self-pay | Admitting: Neurology

## 2014-05-29 NOTE — Telephone Encounter (Signed)
Rx signed and faxed.

## 2014-05-30 ENCOUNTER — Ambulatory Visit: Payer: Medicare Other | Admitting: Physical Therapy

## 2014-05-30 DIAGNOSIS — G7080 Lambert-Eaton syndrome, unspecified: Secondary | ICD-10-CM | POA: Diagnosis not present

## 2014-05-30 DIAGNOSIS — E785 Hyperlipidemia, unspecified: Secondary | ICD-10-CM | POA: Diagnosis not present

## 2014-05-30 DIAGNOSIS — M25571 Pain in right ankle and joints of right foot: Secondary | ICD-10-CM

## 2014-05-30 DIAGNOSIS — E119 Type 2 diabetes mellitus without complications: Secondary | ICD-10-CM | POA: Diagnosis not present

## 2014-05-30 DIAGNOSIS — I10 Essential (primary) hypertension: Secondary | ICD-10-CM | POA: Diagnosis not present

## 2014-05-30 DIAGNOSIS — E669 Obesity, unspecified: Secondary | ICD-10-CM | POA: Diagnosis not present

## 2014-05-30 DIAGNOSIS — M25671 Stiffness of right ankle, not elsewhere classified: Secondary | ICD-10-CM

## 2014-05-30 DIAGNOSIS — S82831D Other fracture of upper and lower end of right fibula, subsequent encounter for closed fracture with routine healing: Secondary | ICD-10-CM | POA: Diagnosis not present

## 2014-05-30 NOTE — Therapy (Signed)
[facility] [place], [place], [address] Phone: [phone]   Fax:  [phone]  Physical Therapy Treatment  Patient Details  Name: [name] MRN: [id number] Date of Birth: 1961/03/19 Referring Provider:  Chipper Herb, MD  Encounter Date: 05/30/2014    Past Medical History  Diagnosis Date  . Depression   . Back pain   . Lambert-Eaton myasthenic syndrome     Followed at [facility]. and Dr. Floyde Parkins  . QT interval     Question prolongation QT interval, October, 2012, review of EKG dated February 04, 2011, QT interval was not prolonged  . Hyperthyroidism     November, 201 to  . Pre-syncope     February 23, 2011  . Murmur     Systolic murmur, December, 2012,No valvular abnormalities, echo, December, 2012  . Anomalous coronary artery origin     The patient underwent cardiac catheterization in 2004.  Coronaries revealed no stenosis.  However he had aberrant takeoff of both the right and left coronaries from the anterior aortic cusp.  There was no evidence of renal artery stenosis  . Ejection fraction     EF 60-65%, echo, March 18, 2011  . Obesity   . Hypertension   . Dyslipidemia   . H/O radioactive iodine thyroid ablation   . Diabetes mellitus without complication   . Dyslipidemia     Past Surgical History  Procedure Laterality Date  . Knee surgery Right   . Tonsillectomy    . Portacath placement      There were no vitals taken for this visit.  Visit Diagnosis:  Pain in joint, ankle and foot, right  Ankle stiffness, right      Subjective Assessment - 05/30/14 1312    Symptoms patient states that since coming out of CAM walker/boot his right GT and second toe is numb.  Very aggravating.   Currently in Pain? No/denies   Multiple Pain Sites No                    [facility] Adult PT Treatment/Exercise - 05/30/14 0001    Modalities   Modalities Electrical Stimulation  Vasopneumatic   Electrical Stimulation   Electrical Stimulation Location Right ankle   Electrical Stimulation Parameters IFC @ 100 scan   Electrical Stimulation Goals Pain;Neuromuscular facilitation   Manual Therapy   Manual Therapy Passive ROM   Passive ROM Passive range of motion to patient's left ankle with left knee flexed and in full extension times 8 mnutes.                     PT Long Term Goals - 05/30/14 1337    PT LONG TERM GOAL #1   Title demonstrate and/or verbalize techniques to reduce the risk of re-injury to include info on : anti-inflammatory (RICE method)   Time 6   Period Weeks   Status On-going   PT LONG TERM GOAL #2   Title be independent with advanced HEP   Time 6   Period Weeks   Status On-going   PT LONG TERM GOAL #3   Title increase ROM increase dorsiflexion to 8 degrees to normalize the patient's gait pattern   Time 6   Period Weeks   Status On-going   PT LONG TERM GOAL #4   Title increase right ankle strength to 4+ to 5/5 to increase stability for functional tasks   Time 6   Period Weeks   Status On-going  PT LONG TERM GOAL #5   Title perform ADLS's with pain not >8/63   Time 6   Period Weeks   Status On-going   Additional Long Term Goals   Additional Long Term Goals Yes   PT LONG TERM GOAL #6   Title perfrom a reciprocating stair gait with one railing   Time 6   Period Weeks   Status On-going               Problem List Patient Active Problem List   Diagnosis Date Noted  . Diabetes 02/28/2013  . Sleep disturbance, unspecified 06/17/2011  . Encounter for therapeutic drug monitoring 06/17/2011  . Myasthenic syndromes in diseases classified elsewhere 06/17/2011  . Murmur   . Ejection fraction   . Depression   . Back pain   . Lambert-Eaton myasthenic syndrome   . QT interval   . Hyperthyroidism   . Pre-syncope   . Anomalous coronary artery origin     APPLEGATE, CHAD,MPT 05/30/2014, 1:54 PM  [facility] [address] [place], [place], [address] Phone: [phone]   Fax:  [phone]

## 2014-06-04 ENCOUNTER — Telehealth: Payer: Self-pay | Admitting: Adult Health

## 2014-06-04 DIAGNOSIS — S83242D Other tear of medial meniscus, current injury, left knee, subsequent encounter: Secondary | ICD-10-CM | POA: Diagnosis not present

## 2014-06-04 DIAGNOSIS — S82401D Unspecified fracture of shaft of right fibula, subsequent encounter for closed fracture with routine healing: Secondary | ICD-10-CM | POA: Diagnosis not present

## 2014-06-04 MED ORDER — PYRIDOSTIGMINE BROMIDE 60 MG PO TABS: 60.0000 mg | ORAL_TABLET | Freq: Three times a day (TID) | ORAL | Status: DC

## 2014-06-04 NOTE — Telephone Encounter (Signed)
I called patient. The patient has gotten weaker since December 2015. He fell and fractured his leg just above the ankle. He has been followed by Dr. Noemi Chapel. He is having some numbness on the top of the foot into the big toe. He may have injured or damage the distal peroneal nerve. He is unable to get his 2,4 DAP through [facility] at this time. I will increase his Mestinon taking 60 mg 3 times daily.

## 2014-06-04 NOTE — Telephone Encounter (Signed)
Patient is calling in regard to Rx Lunesta 1 mg which was prescribed once daily intially and he is now taking 2 per Megan's instructions. Patient states that this is working but he now needs a Rx for increased dossage.  Also, patient was referred to [facility] by Dr. Jannifer Franklin to order Rx 3,4, DAT to make muscles work. [facility] stated that they are out and it will be 30 days before they can get any more. Is there any other Rx that could work to strengthen patient's muscles. Patient states he is very weak without this Rx.  Please call.

## 2014-06-04 NOTE — Telephone Encounter (Signed)
I called the patient's home and his cell phone number, left messages, I will call back later.

## 2014-06-04 NOTE — Telephone Encounter (Signed)
Please see note below regarding [facility] referral.  Patient would rather consult Dr Jannifer Franklin regarding this message.  Is aware he is not avialable today.   Rx for Lunesta 2mg  was already sent to the pharmacy on 02/05.  I called the pharmacy.  They said the patient already picked up the Rx on 02/06 for #30.  I called the patient back .  He now realizes he did pick up the Rx.

## 2014-06-05 ENCOUNTER — Encounter: Payer: Self-pay | Admitting: Physical Therapy

## 2014-06-05 ENCOUNTER — Ambulatory Visit: Payer: Medicare Other | Admitting: Physical Therapy

## 2014-06-05 ENCOUNTER — Other Ambulatory Visit: Payer: Self-pay | Admitting: Neurology

## 2014-06-05 DIAGNOSIS — E669 Obesity, unspecified: Secondary | ICD-10-CM | POA: Diagnosis not present

## 2014-06-05 DIAGNOSIS — M25571 Pain in right ankle and joints of right foot: Secondary | ICD-10-CM

## 2014-06-05 DIAGNOSIS — E785 Hyperlipidemia, unspecified: Secondary | ICD-10-CM | POA: Diagnosis not present

## 2014-06-05 DIAGNOSIS — M25671 Stiffness of right ankle, not elsewhere classified: Secondary | ICD-10-CM

## 2014-06-05 DIAGNOSIS — S82831D Other fracture of upper and lower end of right fibula, subsequent encounter for closed fracture with routine healing: Secondary | ICD-10-CM | POA: Diagnosis not present

## 2014-06-05 DIAGNOSIS — I10 Essential (primary) hypertension: Secondary | ICD-10-CM | POA: Diagnosis not present

## 2014-06-05 DIAGNOSIS — E119 Type 2 diabetes mellitus without complications: Secondary | ICD-10-CM | POA: Diagnosis not present

## 2014-06-05 DIAGNOSIS — G7080 Lambert-Eaton syndrome, unspecified: Secondary | ICD-10-CM | POA: Diagnosis not present

## 2014-06-05 MED ORDER — HYDROCODONE-ACETAMINOPHEN 5-325 MG PO TABS: 1.0000 | ORAL_TABLET | Freq: Four times a day (QID) | ORAL | Status: DC | PRN

## 2014-06-05 NOTE — Telephone Encounter (Signed)
Patient is calling to get a written Rx for Hydrocodone 5-325.  Please call.

## 2014-06-05 NOTE — Telephone Encounter (Signed)
Request entered, forwarded to provider for approval.  

## 2014-06-05 NOTE — Patient Instructions (Signed)
Instructed patient in obtaining a TENS unit.  Will bring in for instruct.

## 2014-06-05 NOTE — Therapy (Signed)
[facility] [place], [place], [address] Phone: [phone]   Fax:  [phone]  Physical Therapy Treatment  Patient Details  Name: [name] MRN: [id number] Date of Birth: [birth year] Referring Provider:  Chipper Herb, MD  Encounter Date: 06/05/2014      PT End of Session - 06/05/14 1459    Visit Number 3   Number of Visits 12   PT Start Time 0234   PT Stop Time 0319   PT Time Calculation (min) 45 min   Activity Tolerance Patient tolerated treatment well   Behavior During Therapy [facility] for tasks assessed/performed      Past Medical History  Diagnosis Date  . Depression   . Back pain   . Lambert-Eaton myasthenic syndrome     Followed at [facility]. and Dr. Floyde Nicholson  . QT interval     Question prolongation QT interval, October, 2012, review of EKG dated February 04, 2011, QT interval was not prolonged  . Hyperthyroidism     November, 201 to  . Pre-syncope     February 23, 2011  . Murmur     Systolic murmur, December, 2012,No valvular abnormalities, echo, December, 2012  . Anomalous coronary artery origin     The patient underwent cardiac catheterization in 2004.  Coronaries revealed no stenosis.  However he had aberrant takeoff of both the right and left coronaries from the anterior aortic cusp.  There was no evidence of renal artery stenosis  . Ejection fraction     EF 60-65%, echo, March 18, 2011  . Obesity   . Hypertension   . Dyslipidemia   . H/O radioactive iodine thyroid ablation   . Diabetes mellitus without complication   . Dyslipidemia     Past Surgical History  Procedure Laterality Date  . Knee surgery Right   . Tonsillectomy    . Portacath placement      There were no vitals taken for this visit.  Visit Diagnosis:  Right ankle pain  Ankle stiffness, right      Subjective Assessment - 06/05/14 1446    Symptoms Took off brace to get in shower and turned right ankle over.  Saw my  MD.  Pain at 8/10 today.   Patient Stated Goals Get out of pain.   Currently in Pain? Yes   Pain Score 8    Pain Location Ankle   Pain Orientation Right   Pain Descriptors / Indicators Aching   Pain Type Chronic pain   Aggravating Factors  Rolling ankle.   Pain Relieving Factors Rest and e'stim                    [facility] Adult PT Treatment/Exercise - 06/05/14 0001    Modalities   Modalities Cryotherapy   Cryotherapy   Number Minutes Cryotherapy 15 Minutes   Cryotherapy Location Ankle   Type of Cryotherapy --  Vasopneumatic   Electrical Stimulation   Electrical Stimulation Location Right ankle   Electrical Stimulation Parameters IFC at 80-150 HZ x 30 minutes   Electrical Stimulation Goals Pain                     PT Long Term Goals - 05/30/14 1337    PT LONG TERM GOAL #1   Title demonstrate and/or verbalize techniques to reduce the risk of re-injury to include info on : anti-inflammatory (RICE method)   Time 6   Period Weeks   Status On-going  PT LONG TERM GOAL #2   Title be independent with advanced HEP   Time 6   Period Weeks   Status On-going   PT LONG TERM GOAL #3   Title increase ROM increase dorsiflexion to 8 degrees to normalize the patient's gait pattern   Time 6   Period Weeks   Status On-going   PT LONG TERM GOAL #4   Title increase right ankle strength to 4+ to 5/5 to increase stability for functional tasks   Time 6   Period Weeks   Status On-going   PT LONG TERM GOAL #5   Title perform ADLS's with pain not >4/23   Time 6   Period Weeks   Status On-going   Additional Long Term Goals   Additional Long Term Goals Yes   PT LONG TERM GOAL #6   Title perfrom a reciprocating stair gait with one railing   Time 6   Period Weeks   Status On-going               Plan - 06/05/14 1500    Clinical Impression Statement 8/10.  Last treatment help get rid of numbness but came back after a couple of days.   Rehab Potential Good    PT Frequency 2x / week   PT Duration 4 weeks   PT Treatment/Interventions Therapeutic exercise;Neuromuscular re-education;Electrical Stimulation;Cryotherapy   PT Next Visit Plan Begin rocker board in parallel bars and ankle isolator.   Consulted and Agree with Plan of Care Patient        Problem List Patient Active Problem List   Diagnosis Date Noted  . Diabetes 02/28/2013  . Sleep disturbance, unspecified 06/17/2011  . Encounter for therapeutic drug monitoring 06/17/2011  . Myasthenic syndromes in diseases classified elsewhere 06/17/2011  . Murmur   . Ejection fraction   . Depression   . Back pain   . Lambert-Eaton myasthenic syndrome   . QT interval   . Hyperthyroidism   . Pre-syncope   . Anomalous coronary artery origin     [name], [name] MPT 06/05/2014, 3:26 PM  [facility] [address] [place], [place], [address] Phone: [phone]   Fax:  [phone]

## 2014-06-06 ENCOUNTER — Telehealth: Payer: Self-pay

## 2014-06-06 NOTE — Telephone Encounter (Signed)
Called patient and informed Rx ready for pick up at front desk. Patient verbalized understanding.  

## 2014-06-11 ENCOUNTER — Ambulatory Visit: Payer: Medicare Other | Admitting: Physical Therapy

## 2014-06-11 DIAGNOSIS — G7080 Lambert-Eaton syndrome, unspecified: Secondary | ICD-10-CM | POA: Diagnosis not present

## 2014-06-11 DIAGNOSIS — E119 Type 2 diabetes mellitus without complications: Secondary | ICD-10-CM | POA: Diagnosis not present

## 2014-06-11 DIAGNOSIS — I10 Essential (primary) hypertension: Secondary | ICD-10-CM | POA: Diagnosis not present

## 2014-06-11 DIAGNOSIS — M25571 Pain in right ankle and joints of right foot: Secondary | ICD-10-CM

## 2014-06-11 DIAGNOSIS — E669 Obesity, unspecified: Secondary | ICD-10-CM | POA: Diagnosis not present

## 2014-06-11 DIAGNOSIS — E785 Hyperlipidemia, unspecified: Secondary | ICD-10-CM | POA: Diagnosis not present

## 2014-06-11 DIAGNOSIS — S82831D Other fracture of upper and lower end of right fibula, subsequent encounter for closed fracture with routine healing: Secondary | ICD-10-CM | POA: Diagnosis not present

## 2014-06-11 NOTE — Therapy (Signed)
[facility] [place], [place], [address] Phone: [phone]   Fax:  [phone]  Physical Therapy Treatment  Patient Details  Name: [name] MRN: [id number] Date of Birth: [birth year] Referring Provider:  Chipper Herb, MD  Encounter Date: 06/11/2014      PT End of Session - 06/11/14 1433    Visit Number 4   Number of Visits 12   PT Start Time 0233   PT Stop Time 0320   PT Time Calculation (min) 47 min   Activity Tolerance Patient tolerated treatment well   Behavior During Therapy [facility] for tasks assessed/performed      Past Medical History  Diagnosis Date  . Depression   . Back pain   . Lambert-Eaton myasthenic syndrome     Followed at [facility]. and Dr. Floyde Parkins  . QT interval     Question prolongation QT interval, October, 2012, review of EKG dated February 04, 2011, QT interval was not prolonged  . Hyperthyroidism     November, 201 to  . Pre-syncope     February 23, 2011  . Murmur     Systolic murmur, December, 2012,No valvular abnormalities, echo, December, 2012  . Anomalous coronary artery origin     The patient underwent cardiac catheterization in 2004.  Coronaries revealed no stenosis.  However he had aberrant takeoff of both the right and left coronaries from the anterior aortic cusp.  There was no evidence of renal artery stenosis  . Ejection fraction     EF 60-65%, echo, March 18, 2011  . Obesity   . Hypertension   . Dyslipidemia   . H/O radioactive iodine thyroid ablation   . Diabetes mellitus without complication   . Dyslipidemia     Past Surgical History  Procedure Laterality Date  . Knee surgery Right   . Tonsillectomy    . Portacath placement      There were no vitals taken for this visit.  Visit Diagnosis:  Right ankle pain      Subjective Assessment - 06/11/14 1435    Symptoms About a 5 today.   Pain Score 5    Pain Location Ankle   Pain Orientation Right   Pain  Descriptors / Indicators Aching   Aggravating Factors  increased up time.   Pain Relieving Factors rest.   Multiple Pain Sites No                    [facility] Adult PT Treatment/Exercise - 06/11/14 0001    Exercises   Exercises Knee/Hip   Knee/Hip Exercises: Aerobic   Stationary Bike --  Nustep level 4 x 10 minutes   Modalities   Modalities Cryotherapy   Cryotherapy   Number Minutes Cryotherapy 15 Minutes   Cryotherapy Location Ankle   Type of Cryotherapy --  Vasopnuematic   Electrical Stimulation   Electrical Stimulation Location Right ankle x 15 minutes             Balance Exercises - 06/11/14 1437    Balance Exercises: Standing   Rockerboard --  5 minutes in parallel bars.                PT Long Term Goals - 05/30/14 1337    PT LONG TERM GOAL #1   Title demonstrate and/or verbalize techniques to reduce the risk of re-injury to include info on : anti-inflammatory (RICE method)   Time 6   Period Weeks   Status On-going  PT LONG TERM GOAL #2   Title be independent with advanced HEP   Time 6   Period Weeks   Status On-going   PT LONG TERM GOAL #3   Title increase ROM increase dorsiflexion to 8 degrees to normalize the patient's gait pattern   Time 6   Period Weeks   Status On-going   PT LONG TERM GOAL #4   Title increase right ankle strength to 4+ to 5/5 to increase stability for functional tasks   Time 6   Period Weeks   Status On-going   PT LONG TERM GOAL #5   Title perform ADLS's with pain not >6/76   Time 6   Period Weeks   Status On-going   Additional Long Term Goals   Additional Long Term Goals Yes   PT LONG TERM GOAL #6   Title perfrom a reciprocating stair gait with one railing   Time 6   Period Weeks   Status On-going               Problem List Patient Active Problem List   Diagnosis Date Noted  . Diabetes 02/28/2013  . Sleep disturbance, unspecified 06/17/2011  . Encounter for therapeutic drug monitoring  06/17/2011  . Myasthenic syndromes in diseases classified elsewhere 06/17/2011  . Murmur   . Ejection fraction   . Depression   . Back pain   . Lambert-Eaton myasthenic syndrome   . QT interval   . Hyperthyroidism   . Pre-syncope   . Anomalous coronary artery origin     Kellie Chisolm, [place] MPT 06/11/2014, 3:24 PM  [facility] [address] [place], [place], [address] Phone: [phone]   Fax:  [phone]

## 2014-06-14 ENCOUNTER — Encounter: Payer: Self-pay | Admitting: Physical Therapy

## 2014-06-14 ENCOUNTER — Ambulatory Visit: Payer: Medicare Other | Attending: Orthopedic Surgery | Admitting: Physical Therapy

## 2014-06-14 DIAGNOSIS — M25571 Pain in right ankle and joints of right foot: Secondary | ICD-10-CM

## 2014-06-14 DIAGNOSIS — I10 Essential (primary) hypertension: Secondary | ICD-10-CM | POA: Diagnosis not present

## 2014-06-14 DIAGNOSIS — E669 Obesity, unspecified: Secondary | ICD-10-CM | POA: Insufficient documentation

## 2014-06-14 DIAGNOSIS — S82831D Other fracture of upper and lower end of right fibula, subsequent encounter for closed fracture with routine healing: Secondary | ICD-10-CM | POA: Insufficient documentation

## 2014-06-14 DIAGNOSIS — G7080 Lambert-Eaton syndrome, unspecified: Secondary | ICD-10-CM | POA: Diagnosis not present

## 2014-06-14 DIAGNOSIS — M25671 Stiffness of right ankle, not elsewhere classified: Secondary | ICD-10-CM

## 2014-06-14 DIAGNOSIS — E119 Type 2 diabetes mellitus without complications: Secondary | ICD-10-CM | POA: Insufficient documentation

## 2014-06-14 DIAGNOSIS — E785 Hyperlipidemia, unspecified: Secondary | ICD-10-CM | POA: Diagnosis not present

## 2014-06-14 NOTE — Therapy (Signed)
[facility] [place], [place], [address] Phone: [phone]   Fax:  [phone]  Physical Therapy Treatment  Patient Details  Name: [name] MRN: [id number] Date of Birth: 1960-12-26 Referring Provider:  Lorn Junes, MD  Encounter Date: 06/14/2014      PT End of Session - 06/14/14 1316    Visit Number 5   Number of Visits 12   PT Start Time 2706   PT Stop Time 1322   PT Time Calculation (min) 48 min      Past Medical History  Diagnosis Date  . Depression   . Back pain   . Lambert-Eaton myasthenic syndrome     Followed at [facility]. and Dr. Floyde Parkins  . QT interval     Question prolongation QT interval, October, 2012, review of EKG dated February 04, 2011, QT interval was not prolonged  . Hyperthyroidism     November, 201 to  . Pre-syncope     February 23, 2011  . Murmur     Systolic murmur, December, 2012,No valvular abnormalities, echo, December, 2012  . Anomalous coronary artery origin     The patient underwent cardiac catheterization in 2004.  Coronaries revealed no stenosis.  However he had aberrant takeoff of both the right and left coronaries from the anterior aortic cusp.  There was no evidence of renal artery stenosis  . Ejection fraction     EF 60-65%, echo, March 18, 2011  . Obesity   . Hypertension   . Dyslipidemia   . H/O radioactive iodine thyroid ablation   . Diabetes mellitus without complication   . Dyslipidemia     Past Surgical History  Procedure Laterality Date  . Knee surgery Right   . Tonsillectomy    . Portacath placement      There were no vitals taken for this visit.  Visit Diagnosis:  Right ankle pain  Ankle stiffness, right  Pain in joint, ankle and foot, right      Subjective Assessment - 06/14/14 1238    Symptoms     Pain Score 4    Pain Orientation Right   Pain Descriptors / Indicators Aching   Pain Type Chronic pain   Aggravating Factors  activity   Pain Relieving Factors rest          [facility] PT Assessment - 06/14/14 0001    ROM / Strength   AROM / PROM / Strength AROM   AROM   Overall AROM  Deficits   AROM Assessment Site Ankle   Right/Left Ankle Right   Right Ankle Dorsiflexion 6                  [facility] Adult PT Treatment/Exercise - 06/14/14 0001    Exercises   Exercises Ankle   Modalities   Modalities Cryotherapy;Electrical Stimulation   Cryotherapy   Number Minutes Cryotherapy 15 Minutes   Cryotherapy Location Ankle   Type of Cryotherapy --  Vasopnumatic device   Electrical Stimulation   Electrical Stimulation Location Right ankle x 15 minutes   Electrical Stimulation Action IFC   Electrical Stimulation Parameters 1-10   Electrical Stimulation Goals Pain   Ankle Exercises: Aerobic   Stationary Bike nustep L4 x 69min   Ankle Exercises: Standing   Rocker Board 4 minutes   Rocker Board Limitations stretching   Ankle Exercises: Supine   T-Band inv/ever 2x10   Other Supine Ankle Exercises ankle isolator 1# 3 x10  PT Long Term Goals - 05/30/14 1337    PT LONG TERM GOAL #1   Title demonstrate and/or verbalize techniques to reduce the risk of re-injury to include info on : anti-inflammatory (RICE method)   Time 6   Period Weeks   Status On-going   PT LONG TERM GOAL #2   Title be independent with advanced HEP   Time 6   Period Weeks   Status On-going   PT LONG TERM GOAL #3   Title increase ROM increase dorsiflexion to 8 degrees to normalize the patient's gait pattern   Time 6   Period Weeks   Status On-going   PT LONG TERM GOAL #4   Title increase right ankle strength to 4+ to 5/5 to increase stability for functional tasks   Time 6   Period Weeks   Status On-going   PT LONG TERM GOAL #5   Title perform ADLS's with pain not >5/73   Time 6   Period Weeks   Status On-going   Additional Long Term Goals   Additional Long Term Goals Yes   PT LONG TERM GOAL #6    Title perfrom a reciprocating stair gait with one railing   Time 6   Period Weeks   Status On-going               Plan - 06/14/14 1318    Clinical Impression Statement pt tolerated tx with no complaints today, improved AROM and less soreness today, goals onging   Rehab Potential Good   PT Frequency 2x / week   PT Duration 4 weeks   PT Treatment/Interventions Therapeutic exercise;Neuromuscular re-education;Electrical Stimulation;Cryotherapy   PT Next Visit Plan Cont with POC per MPT/pt is to continue wearing brace at all times per MD   Consulted and Agree with Plan of Care Patient        Problem List Patient Active Problem List   Diagnosis Date Noted  . Diabetes 02/28/2013  . Sleep disturbance, unspecified 06/17/2011  . Encounter for therapeutic drug monitoring 06/17/2011  . Myasthenic syndromes in diseases classified elsewhere 06/17/2011  . Murmur   . Ejection fraction   . Depression   . Back pain   . Lambert-Eaton myasthenic syndrome   . QT interval   . Hyperthyroidism   . Pre-syncope   . Anomalous coronary artery origin     Phillips Climes, PTA 06/14/2014, 1:28 PM  [facility] [address] [place], [place], [address] Phone: [phone]   Fax:  [phone]

## 2014-06-18 ENCOUNTER — Telehealth: Payer: Self-pay

## 2014-06-18 NOTE — Telephone Encounter (Signed)
Called and left patient a message to see if he wanted to come in for a sooner apt.

## 2014-06-19 ENCOUNTER — Encounter: Payer: Medicare Other | Admitting: *Deleted

## 2014-06-21 ENCOUNTER — Ambulatory Visit: Payer: Medicare Other | Admitting: Adult Health

## 2014-06-22 ENCOUNTER — Encounter: Payer: Medicare Other | Admitting: Physical Therapy

## 2014-06-25 DIAGNOSIS — E890 Postprocedural hypothyroidism: Secondary | ICD-10-CM | POA: Diagnosis not present

## 2014-06-25 DIAGNOSIS — E780 Pure hypercholesterolemia: Secondary | ICD-10-CM | POA: Diagnosis not present

## 2014-06-25 DIAGNOSIS — E1165 Type 2 diabetes mellitus with hyperglycemia: Secondary | ICD-10-CM | POA: Diagnosis not present

## 2014-06-27 ENCOUNTER — Ambulatory Visit (INDEPENDENT_AMBULATORY_CARE_PROVIDER_SITE_OTHER): Payer: Medicare Other | Admitting: Adult Health

## 2014-06-27 ENCOUNTER — Encounter: Payer: Self-pay | Admitting: Adult Health

## 2014-06-27 VITALS — BP 130/79 | HR 5 | Ht 70.0 in | Wt 202.0 lb

## 2014-06-27 DIAGNOSIS — G7000 Myasthenia gravis without (acute) exacerbation: Secondary | ICD-10-CM | POA: Diagnosis not present

## 2014-06-27 DIAGNOSIS — F4323 Adjustment disorder with mixed anxiety and depressed mood: Secondary | ICD-10-CM | POA: Diagnosis not present

## 2014-06-27 NOTE — Progress Notes (Signed)
PATIENT: [name] DOB: [birth year]  REASON FOR VISIT: follow up-  Myasthenia gravis, depression HISTORY FROM: patient  HISTORY OF PRESENT ILLNESS: Mr. Veal is a 54 year old male with a history of myasthenia gravis. He returns today for follow-up. This Mestinon was increased to 60 mg 3 times a day. He states that this has improved his weakness however he still feels weak in the hands and arms  especially in the biceps. He states at times his muscles will actually hurt. He also states that occasionally on the left hand the last 2 digits will  Drawl up and he will have to physically straighten them out. The patient is on an experimental medication from [facility]. He states that he went without the medication for one month and that is when the symptoms started. He recently got a new prescription for this medication and started it last Thursday. He states it usually takes a little while to get his system before he notices an improvement. The patient has been on prednisone in the past  But he caused him to be the aggressive and very agitated. The patient denies any trouble breathing. He states that he does eat slowly because if he eats fast it causes his throat to cramp up. The patient fell back in December he states that his left knee, gave out and he fell and broke his right ankle. The patient has been completing physical therapy. However he has some  Complications-- the ankle became infected- so recovery has lasted longer. Patient states he continues to not sleep well at night. He states that he can follow sleep but doesn't stay asleep. He states when he wakes up his mind is racing he'll usually take a Xanax at that time and typically fall back to sleep. The patient was on Celexa but stopped the medication due to palpitations and feeling his heart beat in his ears. He states the symptoms have resolved since stopping the Celexa. The patient does state that he is under a lot of stress at home. His  30 year old daughter recently told them that she wanted to be transgender. She is proceeding with the process of changing her sexual orientation. His daughter was also diagnosed with pseudotumor cerebri and they've had to make several trips to her college. He also states finances as a been an ongoing issue. The patient had a part-time job but was laid off. Patient states since he broke his ankle he tends to stay at home a lot and will take long naps during the day. The patient does feel that he suffers with depression and anxiety.  He returns today for an evaluation  HISTORY 10/11/13: Mr. Arthurs is a 54 year old male with a history of [name]. He returns today for an evaluation.He is currently taking mestinon and is tolerating it well. The patient also takes celexa and reports that it has been the "best medication" he has tired. The patient was having panic attacks and was given xanax. He reports that this has helped tremendously. Patient also takes Hydrocodone for chronic back pain. He sometimes does have blurry vision and double vision but that has improved since he changed his contact prescription. He denies problems with speech or swallowing. Patient states that he fell 2 weeks ago down steps due to his legs being weak. He reports that he is not sleeping well. He uses Ambien and will fall a sleep but wakes up at 3 am and can not go back to sleep. Patient denies snoring.  This causes him to be fatigued during the day. Patient states that he is under a lot of stress right now and when he wakes up during the night his mind is racing. His daughter is having some issues at school. He is losing his part time job and he has had a lot of unexpected expenses in the last couple of weeks. Patient has been on lunesta in the past and states that it worked better than Ambien. He had to stop it due to cost but now he has a Adult nurse. Patient reports that he is having four teeth removed due to them  being abscessed. Patient was able to come off all diabetic medication due to weight loss and diet control.   REVIEW OF SYSTEMS: Out of a complete 14 system review of symptoms, the patient complains only of the following symptoms, and all other reviewed systems are negative.  Activity change, fatigue, excessive sweating, light sensitivity, eye pain, ringing in ears, trouble swallowing, excessive thirst, diarrhea, insomnia, frequent waking, daytime sleepiness, joint pain, back pain, aching muscles, muscle cramps, walking difficulty, dizziness, numbness, weakness, agitation, depression, nervous/anxious Epworth Sleepiness Scale 3, fatigue severity score 62  ALLERGIES: Allergies  Allergen Reactions  . Toradol [Ketorolac Tromethamine]   . Vioxx [Rofecoxib]   . Rituximab   . Statins     Statins medications results in myalgias    HOME MEDICATIONS: Outpatient Prescriptions Prior to Visit  Medication Sig Dispense Refill  . ALPRAZolam (XANAX) 0.5 MG tablet TAKE 1 TABLET 3 TIMES A DAY AS NEEDED FOR ANXIETY 30 tablet 5  . citalopram (CELEXA) 40 MG tablet Take 40 mg by mouth daily.    . citalopram (CELEXA) 40 MG tablet TAKE 1 TABLET (40 MG TOTAL) BY MOUTH DAILY. 90 tablet 1  . Diaminopyridine POWD MEDICATION IS 10MG  TABLETS....IT IS NOT POWDER....TAKES 3 TABLETS  IN THE MORNING FOR A 30MG  DOSAGE, 2 TABLETS MIDDAY FOR A  20MG  DOSAGE  AND 3 TABLETS AT NIGHT FOR A 30MG  DOSAGE.    . eszopiclone (LUNESTA) 2 MG TABS tablet Take 1 tablet (2 mg total) by mouth at bedtime as needed for sleep. 30 tablet 3  . fish oil-omega-3 fatty acids 1000 MG capsule Take 1 g by mouth 3 (three) times daily.      Marland Kitchen glucose blood (ONE TOUCH ULTRA TEST) test strip 1 each by Other route 3 (three) times daily. 100 each 3  . HYDROcodone-acetaminophen (NORCO/VICODIN) 5-325 MG per tablet Take 1 tablet by mouth every 6 (six) hours as needed. Must last 28 days. 60 tablet 0  . levothyroxine (SYNTHROID, LEVOTHROID) 175 MCG tablet Take 175  mcg by mouth daily before breakfast.    . lisinopril (PRINIVIL,ZESTRIL) 5 MG tablet TAKE 1 TABLET (5 MG TOTAL) BY MOUTH DAILY. 30 tablet 0  . metFORMIN (GLUCOPHAGE) 500 MG tablet Take 1 tablet (500 mg total) by mouth 2 (two) times daily with a meal. 60 tablet 2  . Multiple Vitamins-Minerals (ONE-A-DAY MENS 50+ ADVANTAGE PO) Take 1 tablet by mouth daily.      Glory Rosebush DELICA LANCETS FINE MISC 1 each by Does not apply route 3 (three) times daily. 100 each 1  . pyridostigmine (MESTINON) 60 MG tablet Take 1 tablet (60 mg total) by mouth 3 (three) times daily. 90 tablet 3   No facility-administered medications prior to visit.    PAST MEDICAL HISTORY: Past Medical History  Diagnosis Date  . Depression   . Back pain   . Lambert-Eaton myasthenic syndrome  Followed at [facility]. and Dr. Floyde Parkins  . QT interval     Question prolongation QT interval, October, 2012, review of EKG dated February 04, 2011, QT interval was not prolonged  . Hyperthyroidism     November, 201 to  . Pre-syncope     February 23, 2011  . Murmur     Systolic murmur, December, 2012,No valvular abnormalities, echo, December, 2012  . Anomalous coronary artery origin     The patient underwent cardiac catheterization in 2004.  Coronaries revealed no stenosis.  However he had aberrant takeoff of both the right and left coronaries from the anterior aortic cusp.  There was no evidence of renal artery stenosis  . Ejection fraction     EF 60-65%, echo, March 18, 2011  . Obesity   . Hypertension   . Dyslipidemia   . H/O radioactive iodine thyroid ablation   . Diabetes mellitus without complication   . Dyslipidemia     PAST SURGICAL HISTORY: Past Surgical History  Procedure Laterality Date  . Knee surgery Right   . Tonsillectomy    . Portacath placement      FAMILY HISTORY: Family History  Problem Relation Age of Onset  . Hypertension Mother   . Cirrhosis Father   . Cirrhosis Maternal Grandmother     non  alcoholic  . Heart disease    . Stroke    . Cancer      SOCIAL HISTORY: History   Social History  . Marital Status: Married    Spouse Name: N/A  . Number of Children: 2  . Years of Education: 16   Occupational History  . police dispatcher   .     Social History Main Topics  . Smoking status: Never Smoker   . Smokeless tobacco: Never Used  . Alcohol Use: No  . Drug Use: No  . Sexual Activity: Yes   Other Topics Concern  . Not on file   Social History Narrative      PHYSICAL EXAM  Filed Vitals:   06/27/14 0803  BP: 130/79  Pulse: 5  Height: 5\' 10"  (1.778 m)  Weight: 202 lb (91.627 kg)   Body mass index is 28.98 kg/(m^2).  Generalized: Well developed, in no acute distress   Neurological examination  Mentation: Alert oriented to time, place, history taking. Follows all commands speech and language fluent Cranial nerve II-XII: Pupils were equal round reactive to light. Extraocular movements were full, visual field were full on confrontational test. Facial sensation and strength were normal. Uvula tongue midline. Head turning and shoulder shrug  were normal and symmetric. Motor: The motor testing reveals 5 over 5 strength of all 4 extremities.  He states that the left arm feels "tingly " to light touch.Good symmetric motor tone is noted throughout. Superior gaze for 1 minute resulted in mild ptosis on the left. Arms abducted for 1 minute resulted in no weakness. Sensory: Sensory testing is intact to soft touch on all 4 extremities. No evidence of extinction is noted.  Coordination: Cerebellar testing reveals good finger-nose-finger and heel-to-shin bilaterally.  Gait and station: Patient walks with a limp on the right side due to ankle fracture. Tandem gait not attempted. Romberg negative.   Marland Kitchen   DIAGNOSTIC DATA (LABS, IMAGING, TESTING) - I reviewed patient records, labs, notes, testing and imaging myself where available.    ASSESSMENT AND PLAN 54 y.o. year old  male  has a past medical history of Depression; Back pain; Lambert-Eaton myasthenic syndrome; QT  interval; Hyperthyroidism; Pre-syncope; Murmur; Anomalous coronary artery origin; Ejection fraction; Obesity; Hypertension; Dyslipidemia; H/O radioactive iodine thyroid ablation; Diabetes mellitus without complication; and Dyslipidemia. here with:  1. Myasthenia gravis  2. Depression 3. anxiety  I have instructed the patient to continue the Mestinon 60 mg 3 times a day. For now the patient will continue with the  Experimental drug from [facility]. If he does not notice any improvement after  taking this medication for 1 month he will let us know. I have encouraged the patient to follow-up with a psychiatrist for his ongoing depression and anxiety. The patient would like to discuss this with his wife and will let me know if he wants a referral. I have encouraged the patient to participate in light exercise throughout the day. I have also recommended that he limit his naps to 1 hour. If his symptoms worsen or he develops new symptoms he will let us know. Otherwise he will follow-up in 2-3 months with Dr. Jannifer Franklin.   I spent  1 hour with the patient and 50% of this time was spent counseling the patient on depression and anxiety related to his home situation.   Ward Givens, MSN, NP-C 06/27/2014, 8:07 AM [facility] [address], [place], [place] [address] [phone]  Note: This document was prepared with digital dictation and possible smart phrase technology. Any transcriptional errors that result from this process are unintentional.

## 2014-06-27 NOTE — Progress Notes (Signed)
I have read the note, and I agree with the clinical assessment and plan.  WILLIS,CHARLES KEITH   

## 2014-06-27 NOTE — Patient Instructions (Signed)
Continue Medication from [facility] for the next month- if no improvement in symptoms please let us know. Please consider a psychiatrist to help with depression and anxiety. Try to engage in light exercise throughout the day.  Limit naps to 1 hour.  Continue Lunesta 2 mg at bedtime Continue Mestinon 60 mg three times a day.

## 2014-06-28 DIAGNOSIS — E890 Postprocedural hypothyroidism: Secondary | ICD-10-CM | POA: Diagnosis not present

## 2014-06-28 DIAGNOSIS — E1165 Type 2 diabetes mellitus with hyperglycemia: Secondary | ICD-10-CM | POA: Diagnosis not present

## 2014-06-28 DIAGNOSIS — E780 Pure hypercholesterolemia: Secondary | ICD-10-CM | POA: Diagnosis not present

## 2014-06-28 DIAGNOSIS — I10 Essential (primary) hypertension: Secondary | ICD-10-CM | POA: Diagnosis not present

## 2014-07-03 ENCOUNTER — Telehealth: Payer: Self-pay

## 2014-07-03 ENCOUNTER — Other Ambulatory Visit: Payer: Self-pay | Admitting: Neurology

## 2014-07-03 MED ORDER — HYDROCODONE-ACETAMINOPHEN 5-325 MG PO TABS: 1.0000 | ORAL_TABLET | Freq: Four times a day (QID) | ORAL | Status: DC | PRN

## 2014-07-03 NOTE — Telephone Encounter (Signed)
Called patient and informed Rx ready for pick up at front desk. Patient verbalized understanding.  

## 2014-07-03 NOTE — Telephone Encounter (Signed)
Request entered, forwarded to provider for approval.  

## 2014-07-03 NOTE — Telephone Encounter (Signed)
Patient requesting refill for Rx HYDROcodone-acetaminophen (NORCO/VICODIN) 5-325 MG per tablet.  Please call when ready for pick up.

## 2014-07-05 DIAGNOSIS — S82401D Unspecified fracture of shaft of right fibula, subsequent encounter for closed fracture with routine healing: Secondary | ICD-10-CM | POA: Diagnosis not present

## 2014-07-05 DIAGNOSIS — S83242D Other tear of medial meniscus, current injury, left knee, subsequent encounter: Secondary | ICD-10-CM | POA: Diagnosis not present

## 2014-07-30 ENCOUNTER — Other Ambulatory Visit: Payer: Self-pay | Admitting: Neurology

## 2014-07-30 MED ORDER — HYDROCODONE-ACETAMINOPHEN 5-325 MG PO TABS: 1.0000 | ORAL_TABLET | Freq: Four times a day (QID) | ORAL | Status: DC | PRN

## 2014-07-30 NOTE — Telephone Encounter (Signed)
Pt is calling requesting a written Rx for HYDROcodone-acetaminophen (NORCO/VICODIN) 5-325 MG per tablet. Please call when ready for pick up.

## 2014-07-30 NOTE — Telephone Encounter (Signed)
Dr Jannifer Franklin is out of the office.  Request forwarded to [facility] for review.

## 2014-08-10 DIAGNOSIS — E890 Postprocedural hypothyroidism: Secondary | ICD-10-CM | POA: Diagnosis not present

## 2014-08-27 ENCOUNTER — Other Ambulatory Visit: Payer: Self-pay | Admitting: Neurology

## 2014-08-27 MED ORDER — HYDROCODONE-ACETAMINOPHEN 5-325 MG PO TABS: 1.0000 | ORAL_TABLET | Freq: Four times a day (QID) | ORAL | Status: DC | PRN

## 2014-08-27 NOTE — Telephone Encounter (Signed)
Request entered, forwarded to [facility] for approval because Dr Jannifer Franklin is out of the office.

## 2014-08-27 NOTE — Telephone Encounter (Signed)
Patient called requesting a refill for HYDROcodone-acetaminophen (NORCO/VICODIN) 5-325 MG per tablet. Patient was advised that the script will be ready for pick up in 24hrs unless he heard otherwise by the nurse. Patient can be reached @ [phone]

## 2014-08-28 ENCOUNTER — Telehealth: Payer: Self-pay

## 2014-08-28 NOTE — Telephone Encounter (Signed)
Rx ready for pick up. 

## 2014-09-03 ENCOUNTER — Ambulatory Visit: Payer: Medicare Other | Admitting: Neurology

## 2014-09-14 ENCOUNTER — Ambulatory Visit (INDEPENDENT_AMBULATORY_CARE_PROVIDER_SITE_OTHER): Payer: Medicare Other | Admitting: Neurology

## 2014-09-14 ENCOUNTER — Encounter: Payer: Self-pay | Admitting: Neurology

## 2014-09-14 VITALS — BP 112/68 | HR 57 | Ht 70.0 in | Wt 220.4 lb

## 2014-09-14 DIAGNOSIS — M545 Low back pain, unspecified: Secondary | ICD-10-CM

## 2014-09-14 DIAGNOSIS — G479 Sleep disorder, unspecified: Secondary | ICD-10-CM

## 2014-09-14 DIAGNOSIS — G7080 Lambert-Eaton syndrome, unspecified: Secondary | ICD-10-CM

## 2014-09-14 DIAGNOSIS — R202 Paresthesia of skin: Secondary | ICD-10-CM | POA: Diagnosis not present

## 2014-09-14 MED ORDER — GABAPENTIN 100 MG PO CAPS: ORAL_CAPSULE | ORAL | Status: DC

## 2014-09-14 MED ORDER — ESZOPICLONE 3 MG PO TABS: 3.0000 mg | ORAL_TABLET | Freq: Every day | ORAL | Status: DC

## 2014-09-14 NOTE — Patient Instructions (Signed)
Back Pain, Adult Low back pain is very common. About 1 in 5 people have back pain.The cause of low back pain is rarely dangerous. The pain often gets better over time.About half of people with a sudden onset of back pain feel better in just 2 weeks. About 8 in 10 people feel better by 6 weeks.  CAUSES Some common causes of back pain include:  Strain of the muscles or ligaments supporting the spine.  Wear and tear (degeneration) of the spinal discs.  Arthritis.  Direct injury to the back. DIAGNOSIS Most of the time, the direct cause of low back pain is not known.However, back pain can be treated effectively even when the exact cause of the pain is unknown.Answering your caregiver's questions about your overall health and symptoms is one of the most accurate ways to make sure the cause of your pain is not dangerous. If your caregiver needs more information, he or she may order lab work or imaging tests (X-rays or MRIs).However, even if imaging tests show changes in your back, this usually does not require surgery. HOME CARE INSTRUCTIONS For many people, back pain returns.Since low back pain is rarely dangerous, it is often a condition that people can learn to manageon their own.   Remain active. It is stressful on the back to sit or stand in one place. Do not sit, drive, or stand in one place for more than 30 minutes at a time. Take short walks on level surfaces as soon as pain allows.Try to increase the length of time you walk each day.  Do not stay in bed.Resting more than 1 or 2 days can delay your recovery.  Do not avoid exercise or work.Your body is made to move.It is not dangerous to be active, even though your back may hurt.Your back will likely heal faster if you return to being active before your pain is gone.  Pay attention to your body when you bend and lift. Many people have less discomfortwhen lifting if they bend their knees, keep the load close to their bodies,and  avoid twisting. Often, the most comfortable positions are those that put less stress on your recovering back.  Find a comfortable position to sleep. Use a firm mattress and lie on your side with your knees slightly bent. If you lie on your back, put a pillow under your knees.  Only take over-the-counter or prescription medicines as directed by your caregiver. Over-the-counter medicines to reduce pain and inflammation are often the most helpful.Your caregiver may prescribe muscle relaxant drugs.These medicines help dull your pain so you can more quickly return to your normal activities and healthy exercise.  Put ice on the injured area.  Put ice in a plastic bag.  Place a towel between your skin and the bag.  Leave the ice on for 15-20 minutes, 03-04 times a day for the first 2 to 3 days. After that, ice and heat may be alternated to reduce pain and spasms.  Ask your caregiver about trying back exercises and gentle massage. This may be of some benefit.  Avoid feeling anxious or stressed.Stress increases muscle tension and can worsen back pain.It is important to recognize when you are anxious or stressed and learn ways to manage it.Exercise is a great option. SEEK MEDICAL CARE IF:  You have pain that is not relieved with rest or medicine.  You have pain that does not improve in 1 week.  You have new symptoms.  You are generally not feeling well. SEEK   IMMEDIATE MEDICAL CARE IF:   You have pain that radiates from your back into your legs.  You develop new bowel or bladder control problems.  You have unusual weakness or numbness in your arms or legs.  You develop nausea or vomiting.  You develop abdominal pain.  You feel faint. Document Released: 03/30/2005 Document Revised: 09/29/2011 Document Reviewed: 08/01/2013 [employer] Patient Information 2015 [employer]. This information is not intended to replace advice given to you by your health care provider. Make sure you  discuss any questions you have with your health care provider.  

## 2014-09-14 NOTE — Progress Notes (Signed)
Reason for visit: [name] syndrome  [name] is an 54 y.o. male  History of present illness:  [name] is a 54 year old right-handed white male with a history of [name]. The patient has done well in this regard, he remains on his 2,4 Diaminopyridine which has been helpful. He currently takes 4 capsules twice daily. He gets this medication through [facility]. He has chronic issues with mid back pain. The pain is in the center of the spine, and does not radiate. He takes hydrocodone, he indicates that he requires up to 4 times a day in order to feel normal. He is concerned about the use of the medication. He has muscle stiffness. He has developed some numbness in both hands in the ulnar nerve distribution, the symptoms are worse on the left. He also has developed some numbness in the anterolateral aspects of the thighs consistent with meralgia paresthetica. He has a history of diabetes, but he no longer is on medications as his hemoglobin A1c has normalized with weight loss. He reports occasional falls, he fractured his right fibula in December 2015 with a fall. He does have a TENS unit which does help the back pain when he uses it. The patient has difficulty sleeping at night secondary to anxiety issues, he remains on Celexa and alprazolam. This is not completely effective. He will typically wake up every evening in the middle the night, and may not be able to get back to sleep. He returns for an evaluation.  Past Medical History  Diagnosis Date  . Depression   . Back pain   . Lambert-Eaton myasthenic syndrome     Followed at [facility]. and Dr. Floyde Parkins  . QT interval     Question prolongation QT interval, October, 2012, review of EKG dated February 04, 2011, QT interval was not prolonged  . Hyperthyroidism     November, 201 to  . Pre-syncope     February 23, 2011  . Murmur     Systolic murmur, December, 2012,No valvular abnormalities, echo, December, 2012    . Anomalous coronary artery origin     The patient underwent cardiac catheterization in 2004.  Coronaries revealed no stenosis.  However he had aberrant takeoff of both the right and left coronaries from the anterior aortic cusp.  There was no evidence of renal artery stenosis  . Ejection fraction     EF 60-65%, echo, March 18, 2011  . Obesity   . Hypertension   . Dyslipidemia   . H/O radioactive iodine thyroid ablation   . Diabetes mellitus without complication   . Dyslipidemia   . Broken fibula     right    Past Surgical History  Procedure Laterality Date  . Knee surgery Right   . Tonsillectomy    . Portacath placement      Family History  Problem Relation Age of Onset  . Hypertension Mother   . Cirrhosis Father   . Cirrhosis Maternal Grandmother     non alcoholic  . Heart disease    . Stroke    . Cancer      Social history:  reports that he has never smoked. He has never used smokeless tobacco. He reports that he does not drink alcohol or use illicit drugs.    Allergies  Allergen Reactions  . Toradol [Ketorolac Tromethamine]   . Vioxx [Rofecoxib]   . Rituximab   . Statins     Statins medications results in myalgias  Medications:  Prior to Admission medications   Medication Sig Start Date End Date Taking? Authorizing Provider  ALPRAZolam Duanne Moron) 0.5 MG tablet TAKE 1 TABLET 3 TIMES A DAY AS NEEDED FOR ANXIETY 05/29/14  Yes Kathrynn Ducking, MD  citalopram (CELEXA) 40 MG tablet TAKE 1 TABLET (40 MG TOTAL) BY MOUTH DAILY. 12/28/13  Yes Kathrynn Ducking, MD  Diaminopyridine POWD MEDICATION IS 10MG  TABLETS....IT IS NOT POWDER....TAKES 3 TABLETS  IN THE MORNING FOR A 30MG  DOSAGE, 2 TABLETS MIDDAY FOR A  20MG  DOSAGE  AND 3 TABLETS AT NIGHT FOR A 30MG  DOSAGE.   Yes Historical Provider, MD  eszopiclone (LUNESTA) 2 MG TABS tablet Take 1 tablet (2 mg total) by mouth at bedtime as needed for sleep. 05/18/14  Yes Kathrynn Ducking, MD  fish oil-omega-3 fatty acids 1000 MG  capsule Take 1 g by mouth 3 (three) times daily.     Yes Historical Provider, MD  glucose blood (ONE TOUCH ULTRA TEST) test strip 1 each by Other route 3 (three) times daily. 02/28/13  Yes Deneise Lever, MD  HYDROcodone-acetaminophen (NORCO/VICODIN) 5-325 MG per tablet Take 1 tablet by mouth every 6 (six) hours as needed. Must last 28 days. 08/27/14  Yes Carmen Dohmeier, MD  levothyroxine (SYNTHROID, LEVOTHROID) 175 MCG tablet Take 175 mcg by mouth daily before breakfast.   Yes Historical Provider, MD  lisinopril (PRINIVIL,ZESTRIL) 5 MG tablet TAKE 1 TABLET (5 MG TOTAL) BY MOUTH DAILY.   Yes Lysbeth Penner, FNP  Multiple Vitamins-Minerals (ONE-A-DAY MENS 50+ ADVANTAGE PO) Take 1 tablet by mouth daily.     Yes Historical Provider, MD  [facility] DELICA LANCETS FINE MISC 1 each by Does not apply route 3 (three) times daily. 02/28/13  Yes Deneise Lever, MD  pyridostigmine (MESTINON) 60 MG tablet Take 1 tablet (60 mg total) by mouth 3 (three) times daily. 06/04/14  Yes Kathrynn Ducking, MD    ROS:  Out of a complete 14 system review of symptoms, the patient complains only of the following symptoms, and all other reviewed systems are negative.  Decreased activity, fatigue Difficulty swallowing Double vision Insomnia, frequent waking, daytime sleepiness Joint pain, back pain, achy muscles Numbness, weakness Agitation, depression, anxiety  Blood pressure 112/68, pulse 57, height 5\' 10"  (1.778 m), weight 220 lb 6.4 oz (99.973 kg).  Physical Exam  General: The patient is alert and cooperative at the time of the examination. The patient is moderately obese.  Skin: No significant peripheral edema is noted.   Neurologic Exam  Mental status: The patient is alert and oriented x 3 at the time of the examination. The patient has apparent normal recent and remote memory, with an apparently normal attention span and concentration ability.   Cranial nerves: Facial symmetry is present. Speech is  normal, no aphasia or dysarthria is noted. Extraocular movements are full. Visual fields are full.  Motor: The patient has good strength in all 4 extremities.  Sensory examination: Soft touch sensation is symmetric on the face, arms, and legs.  Coordination: The patient has good finger-nose-finger and heel-to-shin bilaterally.  Gait and station: The patient has a normal gait. Tandem gait is normal. Romberg is negative. No drift is seen.  Reflexes: Deep tendon reflexes are symmetric.   Assessment/Plan:  1. [name]  2. Chronic mid back pain  3. Probable meralgia paresthetica, bilateral  4. Probable ulnar neuropathies, bilateral  5. Anxiety and depression  The patient will be set up for MRI evaluation of the thoracic  spine. Gabapentin will be added for control of the back pain to reduce the use of opiate medications. In the past, the patient indicated that Ultram was offering very little benefit. The patient will undergo nerve conduction studies of both arms, and one leg. EMG evaluation will be done on the left arm. He will follow-up for the study.  Jill Alexanders MD 09/14/2014 2:00 PM  [facility] [address] [place], [place] [address]  Phone [phone] Fax [phone]

## 2014-09-17 ENCOUNTER — Other Ambulatory Visit: Payer: Medicare Other

## 2014-09-18 ENCOUNTER — Encounter: Payer: Self-pay | Admitting: Neurology

## 2014-09-18 ENCOUNTER — Ambulatory Visit (INDEPENDENT_AMBULATORY_CARE_PROVIDER_SITE_OTHER): Payer: Self-pay | Admitting: Neurology

## 2014-09-18 ENCOUNTER — Ambulatory Visit (INDEPENDENT_AMBULATORY_CARE_PROVIDER_SITE_OTHER): Payer: Medicare Other | Admitting: Neurology

## 2014-09-18 DIAGNOSIS — R202 Paresthesia of skin: Secondary | ICD-10-CM

## 2014-09-18 DIAGNOSIS — G7080 Lambert-Eaton syndrome, unspecified: Secondary | ICD-10-CM

## 2014-09-18 MED ORDER — DULOXETINE HCL 30 MG PO CPEP: 30.0000 mg | ORAL_CAPSULE | Freq: Every day | ORAL | Status: DC

## 2014-09-18 NOTE — Procedures (Signed)
     HISTORY:  [name] is a 54 year old gentleman with a history of [name] who has noted onset of numbness and discomfort into the left hand and ulnar nerve distribution, with occasional issues noted on the right as well. The patient is being evaluated for possible ulnar neuropathy or a cervical radiculopathy.  NERVE CONDUCTION STUDIES:  Nerve conduction studies were performed on both upper extremities. The distal motor latencies and motor amplitudes for the median and ulnar nerves were within normal limits. The F wave latencies and nerve conduction velocities for these nerves were also normal. The sensory latencies for the median and ulnar nerves were normal.  Nerve conduction studies were performed on the right lower extremity. The distal motor latencies and motor amplitudes for the peroneal and posterior tibial nerves were within normal limits. The nerve conduction velocities for these nerves were also normal. The H reflex latency was normal. The sensory latency for the peroneal nerve was within normal limits.   EMG STUDIES:  EMG study was performed on the left upper extremity:  The first dorsal interosseous muscle reveals 2 to 5 K units with slightly reduced recruitment. No fibrillations or positive waves were noted. The abductor pollicis brevis muscle reveals 2 to 4 K units with full recruitment. No fibrillations or positive waves were noted. The extensor indicis proprius muscle reveals 1 to 3 K units with full recruitment. No fibrillations or positive waves were noted. The pronator teres muscle reveals 2 to 3 K units with full recruitment. No fibrillations or positive waves were noted. The flexor digitorum profundus muscle (III-IV) reveals 2 to 3 K units with full recruitment. No fibrillations or positive waves were seen. The biceps muscle reveals 1 to 2 K units with full recruitment. No fibrillations or positive waves were noted. The triceps muscle reveals 2 to 4 K  units with full recruitment. No fibrillations or positive waves were noted. The anterior deltoid muscle reveals 2 to 3 K units with full recruitment. No fibrillations or positive waves were noted. The cervical paraspinal muscles were tested at 2 levels. No abnormalities of insertional activity were seen at either level tested. There was good relaxation.   IMPRESSION:  Nerve conduction studies done on both upper extremities and on the right lower extremity were within normal limits. No evidence of a peripheral neuropathy is seen. EMG evaluation of the left upper extremity was relatively unremarkable, without clear evidence of an overlying ulnar neuropathy or cervical radiculopathy.  Jill Alexanders MD 09/18/2014 1:46 PM  [facility] [address] [place], [place] [address]  Phone [phone] Fax [phone]

## 2014-09-18 NOTE — Progress Notes (Signed)
Please refer to EMG and nerve conduction study evaluation procedure note.

## 2014-09-18 NOTE — Progress Notes (Signed)
[name] comes in today for EMG and nerve conduction study evaluation. He has been having paresthesias in the fourth and fifth fingers on the left hand with discomfort.  Nerve conduction studies and EMG evaluation did not show evidence of a neuropathy or cervical radiculopathy. The patient was placed on gabapentin, he cannot tolerate the drug secondary to diarrhea. We will stop the medication, switch to Cymbalta. The patient will be having MRI evaluation of the thoracic spine in the near future for his mid back discomfort.

## 2014-09-22 ENCOUNTER — Other Ambulatory Visit: Payer: Self-pay | Admitting: Neurology

## 2014-09-24 ENCOUNTER — Other Ambulatory Visit: Payer: Medicare Other

## 2014-09-24 NOTE — Telephone Encounter (Signed)
Rx signed and faxed.

## 2014-09-26 ENCOUNTER — Telehealth: Payer: Self-pay | Admitting: Neurology

## 2014-09-26 MED ORDER — DULOXETINE HCL 20 MG PO CPEP: 20.0000 mg | ORAL_CAPSULE | Freq: Every day | ORAL | Status: DC

## 2014-09-26 NOTE — Telephone Encounter (Signed)
Patient called and requested to speak with the nurse regarding some side effects he is having from his medication Rx. DULoxetine (CYMBALTA) 30 MG capsule. He has been taking it for a week and states that he is experiencing blurry vision and dizziness. Please call and advise.

## 2014-09-26 NOTE — Telephone Encounter (Signed)
I called the patient. He states ever since starting Cymbalta last week he has been dizzy, cannot void but feels like he has to all the time and feels "spaced out." He states he does not feel like himself. He requested to go back to the [place].

## 2014-09-26 NOTE — Telephone Encounter (Signed)
I called the patient. He is having some side effects of dizziness on the Cymbalta, the side effects or not extreme, but the patient feels somewhat spacey. All try a lower dose of the 20 mg tablet of the Cymbalta, and take this at nighttime.

## 2014-10-04 MED ORDER — HYDROCODONE-ACETAMINOPHEN 5-325 MG PO TABS: 1.0000 | ORAL_TABLET | Freq: Four times a day (QID) | ORAL | Status: DC | PRN

## 2014-10-04 NOTE — Telephone Encounter (Signed)
I called the patient. The patient is having some troubles with spaciness on the Cymbalta, but the side effects are less on the 20 mg tablet. He believes that it is helping his depression and anxiety. He will take the medication at night, continue on the 20 mg tablet. I will write a prescription for hydrocodone.

## 2014-10-04 NOTE — Addendum Note (Signed)
Addended by: Margette Fast on: 10/04/2014 05:55 PM   Modules accepted: Orders, Medications

## 2014-10-04 NOTE — Telephone Encounter (Signed)
Paitent is calling back because he is still having dizziness and an out of body feeling when taking DULoxetine (CYMBALTA) 20 MG capsule. Please call.Marland Kitchen

## 2014-10-04 NOTE — Telephone Encounter (Signed)
I called the patient. He stated that he is having the same side effects from the Cymbalta that he called about before. I asked if he had cut back to 20 mg at bedtime, as Dr. Jannifer Franklin' note states. He had cut back to 20 mg, but was taking it in the morning. I recommended he try taking it at night and see how he feels then. He questioned if he is supposed to continue taking the Celexa with the Cymbalta or stop the Celexa. Dr. Jannifer Franklin last ordered the Celexa in September of 2015, and had renewed the rest of the medications much more recently. I informed the patient I needed to check with Dr. Jannifer Franklin on that. He also questioned if he could get another Rx for Norco as that really helped with his back pain. His MRI is currently scheduled for 7/5.

## 2014-10-05 ENCOUNTER — Telehealth: Payer: Self-pay

## 2014-10-05 NOTE — Telephone Encounter (Signed)
Rx ready for pick up. 

## 2014-10-17 ENCOUNTER — Ambulatory Visit
Admission: RE | Admit: 2014-10-17 | Discharge: 2014-10-17 | Disposition: A | Discharge status: Home / Self Care | Payer: BLUE CROSS/BLUE SHIELD | Source: Ambulatory Visit | Attending: Neurology | Admitting: Neurology

## 2014-10-17 DIAGNOSIS — G479 Sleep disorder, unspecified: Secondary | ICD-10-CM | POA: Diagnosis not present

## 2014-10-17 DIAGNOSIS — M545 Low back pain, unspecified: Secondary | ICD-10-CM

## 2014-10-17 DIAGNOSIS — G7080 Lambert-Eaton syndrome, unspecified: Secondary | ICD-10-CM | POA: Diagnosis not present

## 2014-10-17 DIAGNOSIS — R202 Paresthesia of skin: Secondary | ICD-10-CM | POA: Diagnosis not present

## 2014-10-18 ENCOUNTER — Telehealth: Payer: Self-pay | Admitting: Neurology

## 2014-10-18 NOTE — Telephone Encounter (Signed)
I called the patient. The MRI of the thoracic spine is OK. If the patient continues to have a lot of pain, we will consider neuromuscular therapy in the future.   MRI thoracic spine 10/18/14:  IMPRESSION:  Normal MRI thoracic spine (without).

## 2014-10-31 ENCOUNTER — Other Ambulatory Visit: Payer: Self-pay | Admitting: Neurology

## 2014-10-31 ENCOUNTER — Telehealth: Payer: Self-pay

## 2014-10-31 MED ORDER — HYDROCODONE-ACETAMINOPHEN 5-325 MG PO TABS: 1.0000 | ORAL_TABLET | Freq: Four times a day (QID) | ORAL | Status: DC | PRN

## 2014-10-31 NOTE — Telephone Encounter (Signed)
Patient call requesting refill for HYDROcodone-acetaminophen (NORCO/VICODIN) 5-325 MG per tablet. He is coming to [place] today with wife. Is inquiring if RX could be ready today, he is aware it can not be filled until Saturday. Patient advised RX will be ready within 24 hours unless otherwise informed by RN. Patient can be reached at [phone].

## 2014-10-31 NOTE — Telephone Encounter (Signed)
Request entered, forwarded to provider for review.  

## 2014-10-31 NOTE — Telephone Encounter (Signed)
Rx ready for pick up. 

## 2014-11-02 ENCOUNTER — Other Ambulatory Visit: Payer: Self-pay | Admitting: Neurology

## 2014-11-02 NOTE — Telephone Encounter (Signed)
The patient was placed on gabapentin, he cannot tolerate the drug secondary to diarrhea. We will stop the medication, switch to Cymbalta

## 2014-11-28 ENCOUNTER — Telehealth: Payer: Self-pay

## 2014-11-28 ENCOUNTER — Other Ambulatory Visit: Payer: Self-pay | Admitting: Neurology

## 2014-11-28 MED ORDER — HYDROCODONE-ACETAMINOPHEN 5-325 MG PO TABS: 1.0000 | ORAL_TABLET | Freq: Four times a day (QID) | ORAL | Status: DC | PRN

## 2014-11-28 NOTE — Telephone Encounter (Signed)
Rx ready for pick up. 

## 2014-11-28 NOTE — Telephone Encounter (Signed)
Request entered, forwarded to provider for review.  

## 2014-11-28 NOTE — Telephone Encounter (Signed)
Patient called requesting refill on HYDROcodone-acetaminophen (NORCO/VICODIN) 5-325 MG per tablet

## 2014-12-05 ENCOUNTER — Other Ambulatory Visit: Payer: Self-pay | Admitting: Neurology

## 2014-12-06 NOTE — Telephone Encounter (Signed)
Kathrynn Ducking, MD at 10/04/2014 5:52 PM     Status: Signed       Expand All Collapse All   I called the patient. The patient is having some troubles with spaciness on the Cymbalta, but the side effects are less on the 20 mg tablet. He believes that it is helping his depression and anxiety. He will take the medication at night, continue on the 20 mg tablet

## 2014-12-09 ENCOUNTER — Other Ambulatory Visit: Payer: Self-pay | Admitting: Neurology

## 2014-12-25 ENCOUNTER — Other Ambulatory Visit: Payer: Self-pay | Admitting: Neurology

## 2014-12-25 MED ORDER — HYDROCODONE-ACETAMINOPHEN 5-325 MG PO TABS: 1.0000 | ORAL_TABLET | Freq: Four times a day (QID) | ORAL | Status: DC | PRN

## 2014-12-25 NOTE — Telephone Encounter (Signed)
Patient is calling to order a written Rx hydrocodone-acetaminophen 5-325 mg per tablet.  Thanks!

## 2014-12-25 NOTE — Telephone Encounter (Signed)
Tomorrow is the 28th day.  Request entered, forwarded to provider for review.

## 2014-12-26 ENCOUNTER — Telehealth: Payer: Self-pay

## 2014-12-26 NOTE — Telephone Encounter (Signed)
Rx ready for pick up. 

## 2014-12-31 DIAGNOSIS — E291 Testicular hypofunction: Secondary | ICD-10-CM | POA: Diagnosis not present

## 2014-12-31 DIAGNOSIS — E1165 Type 2 diabetes mellitus with hyperglycemia: Secondary | ICD-10-CM | POA: Diagnosis not present

## 2014-12-31 DIAGNOSIS — E890 Postprocedural hypothyroidism: Secondary | ICD-10-CM | POA: Diagnosis not present

## 2014-12-31 DIAGNOSIS — E780 Pure hypercholesterolemia: Secondary | ICD-10-CM | POA: Diagnosis not present

## 2015-01-07 DIAGNOSIS — E1165 Type 2 diabetes mellitus with hyperglycemia: Secondary | ICD-10-CM | POA: Diagnosis not present

## 2015-01-07 DIAGNOSIS — E780 Pure hypercholesterolemia: Secondary | ICD-10-CM | POA: Diagnosis not present

## 2015-01-07 DIAGNOSIS — E890 Postprocedural hypothyroidism: Secondary | ICD-10-CM | POA: Diagnosis not present

## 2015-01-07 DIAGNOSIS — I10 Essential (primary) hypertension: Secondary | ICD-10-CM | POA: Diagnosis not present

## 2015-01-21 ENCOUNTER — Other Ambulatory Visit: Payer: Self-pay | Admitting: Neurology

## 2015-01-21 MED ORDER — HYDROCODONE-ACETAMINOPHEN 5-325 MG PO TABS: 1.0000 | ORAL_TABLET | Freq: Four times a day (QID) | ORAL | Status: DC | PRN

## 2015-01-21 NOTE — Telephone Encounter (Signed)
Pt needs refill on HYDROcodone-acetaminophen (NORCO/VICODIN) 5-325 MG per tablet. Thank you

## 2015-01-21 NOTE — Telephone Encounter (Signed)
Request entered, forwarded to provider for approval.  

## 2015-01-22 ENCOUNTER — Telehealth: Payer: Self-pay

## 2015-01-22 NOTE — Telephone Encounter (Signed)
Rx ready for pick up. 

## 2015-02-13 ENCOUNTER — Telehealth: Payer: Self-pay | Admitting: Neurology

## 2015-02-13 ENCOUNTER — Other Ambulatory Visit: Payer: Self-pay | Admitting: Neurology

## 2015-02-13 NOTE — Telephone Encounter (Signed)
Patient is calling to let Dr. Jannifer Franklin know that he lost his medication HYDROcodone-acetaminophen (NORCO/VICODIN) 5-325 MG tablet [place] the [place]. Should he call anyone else and let them know. Please call and advise. Thank you.

## 2015-02-13 NOTE — Telephone Encounter (Signed)
I called the patient. The patient lost his prescription for hydrocodone. The next prescription is due on November 7, I will write the prescription at that time.

## 2015-02-18 ENCOUNTER — Other Ambulatory Visit: Payer: Self-pay | Admitting: Neurology

## 2015-02-18 MED ORDER — HYDROCODONE-ACETAMINOPHEN 5-325 MG PO TABS: 1.0000 | ORAL_TABLET | Freq: Four times a day (QID) | ORAL | Status: DC | PRN

## 2015-02-20 DIAGNOSIS — G7080 Lambert-Eaton syndrome, unspecified: Secondary | ICD-10-CM | POA: Diagnosis not present

## 2015-02-20 DIAGNOSIS — Z23 Encounter for immunization: Secondary | ICD-10-CM | POA: Diagnosis not present

## 2015-02-20 DIAGNOSIS — Z5181 Encounter for therapeutic drug level monitoring: Secondary | ICD-10-CM | POA: Diagnosis not present

## 2015-02-21 ENCOUNTER — Ambulatory Visit (INDEPENDENT_AMBULATORY_CARE_PROVIDER_SITE_OTHER): Payer: Worker's Compensation | Admitting: Nurse Practitioner

## 2015-02-21 ENCOUNTER — Encounter: Payer: Self-pay | Admitting: Nurse Practitioner

## 2015-02-21 VITALS — BP 124/79 | HR 67 | Temp 97.0°F | Ht 70.0 in | Wt 227.0 lb

## 2015-02-21 DIAGNOSIS — W57XXXA Bitten or stung by nonvenomous insect and other nonvenomous arthropods, initial encounter: Principal | ICD-10-CM

## 2015-02-21 DIAGNOSIS — S40861A Insect bite (nonvenomous) of right upper arm, initial encounter: Secondary | ICD-10-CM | POA: Diagnosis not present

## 2015-02-21 DIAGNOSIS — S40261A Insect bite (nonvenomous) of right shoulder, initial encounter: Secondary | ICD-10-CM

## 2015-02-21 DIAGNOSIS — L089 Local infection of the skin and subcutaneous tissue, unspecified: Secondary | ICD-10-CM | POA: Diagnosis not present

## 2015-02-21 MED ORDER — SULFAMETHOXAZOLE-TRIMETHOPRIM 800-160 MG PO TABS: 1.0000 | ORAL_TABLET | Freq: Two times a day (BID) | ORAL | Status: DC

## 2015-02-21 NOTE — Progress Notes (Signed)
   Subjective:    Patient ID: [name], male    DOB: 04/27/1960, 54 y.o.   MRN: [id number]  HPI DATE OF INJURY 02/19/15  Patient was bitten by something trying to get a cat out of a trap. He was bitten by the cat. Felt more like a sting. Woke up the next morning and area was reed and swollen and burning.  Review of Systems  Constitutional: Negative.   HENT: Negative.   Respiratory: Negative.   Cardiovascular: Negative.   Genitourinary: Negative.   Neurological: Negative.   Psychiatric/Behavioral: Negative.   All other systems reviewed and are negative.      Objective:   Physical Exam  Constitutional: He is oriented to person, place, and time. He appears well-developed and well-nourished.  Cardiovascular: Normal rate, regular rhythm and normal heart sounds.   Pulmonary/Chest: Effort normal and breath sounds normal.  Neurological: He is alert and oriented to person, place, and time.  Skin: Skin is warm and dry.  2cm annular red lesion with surrounding echymosis on right upper inner arm. Grips equal bil  Psychiatric: He has a normal mood and affect. His behavior is normal. Judgment and thought content normal.   BP 124/79 mmHg  Pulse 67  Temp(Src) 97 F (36.1 C) (Oral)  Ht 5\' 10"  (1.778 m)  Wt 227 lb (102.967 kg)  BMI 32.57 kg/m2       Assessment & Plan:  1. Nonvenomous bug bite of shoulder or upper arm, infected, right, initial encounter Meds ordered this encounter  Medications  . sulfamethoxazole-trimethoprim (BACTRIM DS) 800-160 MG tablet    Sig: Take 1 tablet by mouth 2 (two) times daily.    Dispense:  14 tablet    Refill:  0    Order Specific Question:  Supervising Provider    Answer:  Joycelyn Man   Watch area Do not pick or scratch RTO prn NO work restrictions  [place], [place]

## 2015-02-21 NOTE — Patient Instructions (Signed)
Insect Bite °Mosquitoes, flies, fleas, bedbugs, and other insects can bite. Insect bites are different from insect stings. The bite may be red, puffy (swollen), and itchy for 2 to 4 days. Most bites get better on their own. °HOME CARE  °· Do not scratch the bite. °· Keep the bite clean and dry. Wash the bite with soap and water every day, as told by your doctor. °· If directed, apply ice to the bite area. °¨ Put ice in a plastic bag. °¨ Place a towel between your skin and the bag. °¨ Leave the ice on for 20 minutes, 2-3 times per day. °· Follow instructions from your doctor about using medicated lotions or creams. These can help with itching. °· Apply or take over-the-counter and prescription medicines only as told by your doctor. °· If you were given an antibiotic medicine, use it as told by your doctor. Do not stop using the medicine even if your condition improves. °· Keep all follow-up visits as told by your doctor. This is important. °GET HELP IF: °· You have redness, swelling (inflammation), or pain near your bite that is getting worse. °· You have a fever. °GET HELP RIGHT AWAY IF:  °· You have joint pain.   °· You have fluid, blood, or pus coming from the bite area.   °· You have a headache. °· You have neck pain. °· You feel weaker than you normally do.   °· You have a rash.   °· You have chest pain. °· You have shortness of breath. °· You have stomach pain, feel sick to your stomach (nauseous), or throw up (vomit). °· You feel more tired or sleepy than you normally do. °  °This information is not intended to replace advice given to you by your health care provider. Make sure you discuss any questions you have with your health care provider. °  °Document Released: 03/27/2000 Document Revised: 12/19/2014 Document Reviewed: 08/15/2014 °Elsevier Interactive Patient Education ©2016 [employer] ° °

## 2015-03-05 ENCOUNTER — Telehealth: Payer: Self-pay | Admitting: *Deleted

## 2015-03-05 ENCOUNTER — Other Ambulatory Visit: Payer: Self-pay | Admitting: Neurology

## 2015-03-05 NOTE — Telephone Encounter (Signed)
Eszopiclone rx faxed and confirmed to pharmacy at [phone].

## 2015-03-05 NOTE — Telephone Encounter (Signed)
Dr Willis is out of the office.  Request forwarded to WID for review.   

## 2015-03-12 ENCOUNTER — Telehealth: Payer: Self-pay | Admitting: Family Medicine

## 2015-03-18 ENCOUNTER — Telehealth: Payer: Self-pay | Admitting: Neurology

## 2015-03-18 NOTE — Telephone Encounter (Signed)
Patient called to request refill of HYDROcodone-acetaminophen (NORCO/VICODIN) 5-325 MG tablet °

## 2015-03-19 ENCOUNTER — Other Ambulatory Visit: Payer: Self-pay

## 2015-03-19 ENCOUNTER — Encounter: Payer: Self-pay | Admitting: *Deleted

## 2015-03-19 MED ORDER — HYDROCODONE-ACETAMINOPHEN 5-325 MG PO TABS: 1.0000 | ORAL_TABLET | Freq: Four times a day (QID) | ORAL | Status: DC | PRN

## 2015-03-19 NOTE — Telephone Encounter (Signed)
Message was just routed today.  Request has been sent to provider for review.

## 2015-03-19 NOTE — Progress Notes (Signed)
Hydrocodone rx. up front [facility] 

## 2015-03-21 ENCOUNTER — Other Ambulatory Visit: Payer: Self-pay | Admitting: Neurology

## 2015-04-09 ENCOUNTER — Other Ambulatory Visit: Payer: Self-pay | Admitting: Neurology

## 2015-04-10 ENCOUNTER — Other Ambulatory Visit: Payer: Self-pay

## 2015-04-10 ENCOUNTER — Other Ambulatory Visit: Payer: Self-pay | Admitting: Neurology

## 2015-04-10 MED ORDER — ESZOPICLONE 3 MG PO TABS: ORAL_TABLET | ORAL | Status: DC

## 2015-04-10 NOTE — Telephone Encounter (Signed)
Dr Willis is out of the office.  Request entered, forwarded to WID for review.    

## 2015-04-11 ENCOUNTER — Other Ambulatory Visit: Payer: Self-pay

## 2015-04-16 ENCOUNTER — Other Ambulatory Visit: Payer: Self-pay | Admitting: Neurology

## 2015-04-16 MED ORDER — HYDROCODONE-ACETAMINOPHEN 5-325 MG PO TABS: 1.0000 | ORAL_TABLET | Freq: Four times a day (QID) | ORAL | Status: DC | PRN

## 2015-04-16 NOTE — Telephone Encounter (Signed)
Pt would like a call when rx is ready for pick up. He states he has to take time off of work to come over here. Thank you

## 2015-04-16 NOTE — Telephone Encounter (Signed)
Patient is calling to order a written Rx hydrocodone-acetaminophen 5-325 mg.  Thanks!

## 2015-04-16 NOTE — Telephone Encounter (Signed)
I called the patient to advise that his Rx is ready for pick up.

## 2015-04-30 ENCOUNTER — Telehealth: Payer: Self-pay | Admitting: Neurology

## 2015-04-30 MED ORDER — MIRTAZAPINE 30 MG PO TABS: 30.0000 mg | ORAL_TABLET | Freq: Every day | ORAL | Status: DC

## 2015-04-30 NOTE — Telephone Encounter (Signed)
I called the patient. He did well with the Lunesta initially, but now he cannot sleep. Trazodone in the past was not well tolerated. I will stop the Celexa and start Remeron at night.

## 2015-04-30 NOTE — Telephone Encounter (Signed)
Pt called said he cannot sleep. He takes Lunesta, goes to sleep for about 2-3 hrs, wakes up and does not go back to sleep. He said this has been happening for about 1 week. Pt said he is not under any stress lately. He cut out all caffeine, don't eat late for about 1 yr. He takes melatonin. He said he gets so sleepy the next day at work it is hard to stay awake.

## 2015-05-09 MED ORDER — ZOLPIDEM TARTRATE 10 MG PO TABS: 10.0000 mg | ORAL_TABLET | Freq: Every evening | ORAL | Status: DC | PRN

## 2015-05-09 NOTE — Addendum Note (Signed)
Addended by: Margette Fast on: 05/09/2015 05:27 PM   Modules accepted: Orders, Medications

## 2015-05-09 NOTE — Telephone Encounter (Signed)
Patient is calling. He started taking mirtazapine (REMERON) 30 MG tablet and is having some issues. When he wakes up he says he feels weird like he has been asleep and not slept. He says he goes to sleep around 8 or 8:30pm.The patient's wife says he swings his arms around when he sleeps like he is fighting, yelling in his sleep and also says he snores. The patient cut the pill in half and that did not help. Please call to discuss.

## 2015-05-09 NOTE — Telephone Encounter (Signed)
I called patient. He cannot tolerate the mirtazapine, he has tried trazodone in the past which did a similar thing to him. The patient flails around at night while sleeping. The [name] is no longer effective. We may go back to the Ambien which helped before and see if he has regained benefit with it now. In the future, we may consider increasing alprazolam dosing at nighttime for sleep.

## 2015-05-13 ENCOUNTER — Other Ambulatory Visit: Payer: Self-pay | Admitting: Neurology

## 2015-05-13 MED ORDER — HYDROCODONE-ACETAMINOPHEN 5-325 MG PO TABS: 1.0000 | ORAL_TABLET | Freq: Four times a day (QID) | ORAL | Status: DC | PRN

## 2015-05-13 NOTE — Therapy (Signed)
[facility] [place], [place], [address] Phone: [phone]   Fax:  [phone]  Physical Therapy Treatment  Patient Details  Name: [name] MRN: [id number] Date of Birth: [birth year] No Data Recorded  Encounter Date: 06/14/2014    Past Medical History  Diagnosis Date  . Depression   . Back pain   . Lambert-Eaton myasthenic syndrome ([place])     Followed at [facility]. and Dr. Floyde Parkins  . QT interval     Question prolongation QT interval, October, 2012, review of EKG dated February 04, 2011, QT interval was not prolonged  . Hyperthyroidism     November, 201 to  . Pre-syncope     February 23, 2011  . Murmur     Systolic murmur, December, 2012,No valvular abnormalities, echo, December, 2012  . Anomalous coronary artery origin     The patient underwent cardiac catheterization in 2004.  Coronaries revealed no stenosis.  However he had aberrant takeoff of both the right and left coronaries from the anterior aortic cusp.  There was no evidence of renal artery stenosis  . Ejection fraction     EF 60-65%, echo, March 18, 2011  . Obesity   . Hypertension   . Dyslipidemia   . H/O radioactive iodine thyroid ablation   . Diabetes mellitus without complication ([place])   . Dyslipidemia   . Broken fibula     right    Past Surgical History  Procedure Laterality Date  . Knee surgery Right   . Tonsillectomy    . Portacath placement      There were no vitals filed for this visit.  Visit Diagnosis:  Right ankle pain  Ankle stiffness, right  Pain in joint, ankle and foot, right                                    PT Long Term Goals - 05/30/14 1337    PT LONG TERM GOAL #1   Title demonstrate and/or verbalize techniques to reduce the risk of re-injury to include info on : anti-inflammatory (RICE method)   Time 6   Period Weeks   Status On-going   PT LONG TERM GOAL #2   Title be independent  with advanced HEP   Time 6   Period Weeks   Status On-going   PT LONG TERM GOAL #3   Title increase ROM increase dorsiflexion to 8 degrees to normalize the patient's gait pattern   Time 6   Period Weeks   Status On-going   PT LONG TERM GOAL #4   Title increase right ankle strength to 4+ to 5/5 to increase stability for functional tasks   Time 6   Period Weeks   Status On-going   PT LONG TERM GOAL #5   Title perform ADLS's with pain not >8/92   Time 6   Period Weeks   Status On-going   Additional Long Term Goals   Additional Long Term Goals Yes   PT LONG TERM GOAL #6   Title perfrom a reciprocating stair gait with one railing   Time 6   Period Weeks   Status On-going               Problem List Patient Active Problem List   Diagnosis Date Noted  . Diabetes ([place]) 02/28/2013  . Disturbance in sleep behavior 06/17/2011  . Encounter for therapeutic drug monitoring 06/17/2011  .  Myasthenic syndromes in diseases classified elsewhere 06/17/2011  . Murmur   . Ejection fraction   . Depression   . Back pain   . Lambert-Eaton myasthenic syndrome ([place])   . QT interval   . Hyperthyroidism   . Pre-syncope   . Anomalous coronary artery origin    PHYSICAL THERAPY DISCHARGE SUMMARY  Visits from Start of Care: 4  Current functional level related to goals / functional outcomes: Please see above.   Remaining deficits: Goals unmet.   Education / Equipment: HEP. Plan: Patient agrees to discharge.  Patient goals were not met. Patient is being discharged due to not returning since the last visit.  ?????      Doron Shake, [place] MPT 05/13/2015, 5:50 PM  [facility] [address] [place], [place], [address] Phone: [phone]   Fax:  [phone]  Name: [name] MRN: [id number] Date of Birth: January 17, 1961

## 2015-05-13 NOTE — Telephone Encounter (Signed)
Patient called to request refill of HYDROcodone-acetaminophen (NORCO/VICODIN) 5-325 MG tablet °

## 2015-05-14 ENCOUNTER — Telehealth: Payer: Self-pay

## 2015-05-14 NOTE — Telephone Encounter (Signed)
Rx is ready for pick up.

## 2015-05-30 ENCOUNTER — Other Ambulatory Visit: Payer: Self-pay | Admitting: Neurology

## 2015-05-30 ENCOUNTER — Other Ambulatory Visit: Payer: Self-pay

## 2015-05-30 MED ORDER — DULOXETINE HCL 20 MG PO CPEP: 20.0000 mg | ORAL_CAPSULE | Freq: Every day | ORAL | Status: DC

## 2015-06-06 ENCOUNTER — Other Ambulatory Visit: Payer: Self-pay | Admitting: Neurology

## 2015-06-10 ENCOUNTER — Telehealth: Payer: Self-pay | Admitting: Neurology

## 2015-06-10 MED ORDER — HYDROCODONE-ACETAMINOPHEN 5-325 MG PO TABS: 1.0000 | ORAL_TABLET | Freq: Four times a day (QID) | ORAL | Status: DC | PRN

## 2015-06-10 NOTE — Telephone Encounter (Signed)
I will refill the hydrocodone prescription.

## 2015-06-10 NOTE — Telephone Encounter (Signed)
Pt called requesting refill for HYDROcodone-acetaminophen (NORCO/VICODIN) 5-325 MG tablet .

## 2015-06-11 ENCOUNTER — Telehealth: Payer: Self-pay | Admitting: *Deleted

## 2015-06-11 NOTE — Telephone Encounter (Signed)
Hydrocodone rx. up front [facility]fim 

## 2015-07-02 DIAGNOSIS — E7800 Pure hypercholesterolemia, unspecified: Secondary | ICD-10-CM | POA: Diagnosis not present

## 2015-07-02 DIAGNOSIS — E291 Testicular hypofunction: Secondary | ICD-10-CM | POA: Diagnosis not present

## 2015-07-02 DIAGNOSIS — E1165 Type 2 diabetes mellitus with hyperglycemia: Secondary | ICD-10-CM | POA: Diagnosis not present

## 2015-07-02 DIAGNOSIS — E890 Postprocedural hypothyroidism: Secondary | ICD-10-CM | POA: Diagnosis not present

## 2015-07-08 ENCOUNTER — Other Ambulatory Visit: Payer: Self-pay | Admitting: Neurology

## 2015-07-08 DIAGNOSIS — E890 Postprocedural hypothyroidism: Secondary | ICD-10-CM | POA: Diagnosis not present

## 2015-07-08 DIAGNOSIS — E7800 Pure hypercholesterolemia, unspecified: Secondary | ICD-10-CM | POA: Diagnosis not present

## 2015-07-08 DIAGNOSIS — I10 Essential (primary) hypertension: Secondary | ICD-10-CM | POA: Diagnosis not present

## 2015-07-08 DIAGNOSIS — E1165 Type 2 diabetes mellitus with hyperglycemia: Secondary | ICD-10-CM | POA: Diagnosis not present

## 2015-07-08 MED ORDER — HYDROCODONE-ACETAMINOPHEN 5-325 MG PO TABS: 1.0000 | ORAL_TABLET | Freq: Four times a day (QID) | ORAL | Status: DC | PRN

## 2015-07-08 NOTE — Telephone Encounter (Signed)
Spoke to pt to let him know prescription ready for pick up.

## 2015-07-08 NOTE — Telephone Encounter (Signed)
Patient called to request refill of HYDROcodone-acetaminophen (NORCO/VICODIN) 5-325 MG tablet °

## 2015-08-05 ENCOUNTER — Other Ambulatory Visit: Payer: Self-pay | Admitting: Neurology

## 2015-08-05 MED ORDER — HYDROCODONE-ACETAMINOPHEN 5-325 MG PO TABS: 1.0000 | ORAL_TABLET | Freq: Four times a day (QID) | ORAL | Status: DC | PRN

## 2015-08-05 NOTE — Telephone Encounter (Signed)
RX request routed to Dr. Jannifer Franklin for review. It has been 28 days since last prescribed on 07/08/15.

## 2015-08-05 NOTE — Telephone Encounter (Signed)
Patient is calling to order a written Rx hydrocodone-acetaminophen 5-325 mg tablets.  Thanks!

## 2015-08-06 MED ORDER — HYDROCODONE-ACETAMINOPHEN 5-325 MG PO TABS: 1.0000 | ORAL_TABLET | Freq: Four times a day (QID) | ORAL | Status: DC | PRN

## 2015-08-06 NOTE — Telephone Encounter (Signed)
It appears that Dr. Jannifer Franklin approved this RX at 5:33pm yesterday. Unsure of where the RX was placed since the RNs were gone from the office after 5:30.

## 2015-08-06 NOTE — Telephone Encounter (Signed)
RX was not on my desk w/ other printed/signed scripts this a.m. Sent to work-in MD for review.

## 2015-08-06 NOTE — Telephone Encounter (Signed)
Error

## 2015-08-06 NOTE — Addendum Note (Signed)
Addended by: Monte Fantasia on: 08/06/2015 09:29 AM   Modules accepted: Orders

## 2015-08-06 NOTE — Telephone Encounter (Signed)
Rx printed, signed, up front for pick-up. Called pt to notify that rx is available.

## 2015-08-06 NOTE — Telephone Encounter (Signed)
Patient called to check status of refill request.

## 2015-08-14 ENCOUNTER — Other Ambulatory Visit: Payer: Self-pay | Admitting: Neurology

## 2015-08-20 DIAGNOSIS — E1165 Type 2 diabetes mellitus with hyperglycemia: Secondary | ICD-10-CM | POA: Diagnosis not present

## 2015-08-20 DIAGNOSIS — E890 Postprocedural hypothyroidism: Secondary | ICD-10-CM | POA: Diagnosis not present

## 2015-08-24 ENCOUNTER — Other Ambulatory Visit: Payer: Self-pay | Admitting: Neurology

## 2015-08-26 NOTE — Telephone Encounter (Signed)
Last OV 09/14/14 w/ NCV/EMG on 09/18/14. No f/u appt scheduled.

## 2015-08-27 NOTE — Telephone Encounter (Signed)
Rx printed, signed, faxed to pharmacy. 

## 2015-09-04 ENCOUNTER — Other Ambulatory Visit: Payer: Self-pay | Admitting: Neurology

## 2015-09-04 MED ORDER — HYDROCODONE-ACETAMINOPHEN 5-325 MG PO TABS: 1.0000 | ORAL_TABLET | Freq: Four times a day (QID) | ORAL | Status: DC | PRN

## 2015-09-04 NOTE — Telephone Encounter (Signed)
Patient requesting written Rx for HYDROcodone-acetaminophen (NORCO/VICODIN) 5-325 MG tablet. I advised the Rx will be ready in 24 hours unless the nurse advises otherwise.

## 2015-09-04 NOTE — Telephone Encounter (Signed)
Last OV June 2016. Last refilled 08/06/15. Pt needs to schedule 1 yr follow-up appt for ongoing refills.

## 2015-09-05 ENCOUNTER — Other Ambulatory Visit: Payer: Self-pay | Admitting: Neurology

## 2015-09-05 NOTE — Telephone Encounter (Signed)
Rx printed, signed, up front for pick-up. 

## 2015-10-01 ENCOUNTER — Telehealth: Payer: Self-pay | Admitting: Neurology

## 2015-10-01 NOTE — Telephone Encounter (Signed)
Last OV 09/14/14 w/ NCV/EMG on 09/18/14. No f/u appt scheduled.

## 2015-10-01 NOTE — Telephone Encounter (Signed)
Patient called to request refill of HYDROcodone-acetaminophen (NORCO/VICODIN) 5-325 MG tablet

## 2015-10-01 NOTE — Telephone Encounter (Signed)
OK to refill, but will need a RV set up.

## 2015-10-01 NOTE — Telephone Encounter (Signed)
Last rx stated - 1 yr follow-up appt needed. Would you like pt to schedule appt prior to refill?

## 2015-10-02 MED ORDER — HYDROCODONE-ACETAMINOPHEN 5-325 MG PO TABS: 1.0000 | ORAL_TABLET | Freq: Four times a day (QID) | ORAL | Status: DC | PRN

## 2015-10-02 NOTE — Addendum Note (Signed)
Addended by: Monte Fantasia on: 10/02/2015 10:17 AM   Modules accepted: Orders

## 2015-10-02 NOTE — Telephone Encounter (Signed)
Signed and placed up front for pick up.  

## 2015-10-02 NOTE — Telephone Encounter (Signed)
Rx printed, awaiting signature. Pt instructed to make f/u appt when picking up rx.

## 2015-10-06 ENCOUNTER — Other Ambulatory Visit: Payer: Self-pay | Admitting: Neurology

## 2015-10-08 ENCOUNTER — Telehealth: Payer: Self-pay | Admitting: Neurology

## 2015-10-08 NOTE — Telephone Encounter (Signed)
[name] is calling and states they are wanting to do an extraction on this patient on Thursday and he has had his catheter flushed with Heprin. Can there extraction be done.  Please call

## 2015-10-08 NOTE — Telephone Encounter (Signed)
Patient called to advise, he had port-a-cath flushed out last week, is having dental work to have tooth pulled on Thursday with Dr. Wynetta Emery, needs to know if it has been long enough since Heparin pushed through. States he has been told previously to wait 7 days, states today would be the 7th day.

## 2015-10-09 NOTE — Telephone Encounter (Signed)
Called dentist office - can proceed w/ dental work if port was flushed 7 or more days ago. Usually heparin flush is only to "lock" port-a-cath so that clots do not form preventing later infusions. Verbalized understanding and appreciation for call.

## 2015-10-09 NOTE — Telephone Encounter (Signed)
Left VM mssg yesterday evening advising that he can proceed w/ dental work if port was flushed 7 or more days ago. Usually heparin flush is only to "lock" port-a-cath so that clots do not form preventing later infusions.

## 2015-10-31 ENCOUNTER — Encounter: Payer: Self-pay | Admitting: Neurology

## 2015-10-31 ENCOUNTER — Ambulatory Visit (INDEPENDENT_AMBULATORY_CARE_PROVIDER_SITE_OTHER): Payer: Medicare Other | Admitting: Neurology

## 2015-10-31 VITALS — BP 146/86 | HR 65 | Ht 70.0 in | Wt 229.0 lb

## 2015-10-31 DIAGNOSIS — G7080 Lambert-Eaton syndrome, unspecified: Secondary | ICD-10-CM | POA: Diagnosis not present

## 2015-10-31 MED ORDER — HYDROCODONE-ACETAMINOPHEN 5-325 MG PO TABS: 1.0000 | ORAL_TABLET | Freq: Four times a day (QID) | ORAL | Status: DC | PRN

## 2015-10-31 NOTE — Progress Notes (Signed)
Reason for visit: Rita Ohara syndrome  SWANSON KOSTOFF is an 55 y.o. male  History of present illness:  Mr. Neals is a 55 year old right-handed white male with a history of [name]. The patient has been stable for number of years, he does report ongoing generalized fatigue that is often times worse in the hot summer months. The patient has difficulty with anxiety and depression, he has insomnia that is in part associated with this. The patient has not had any recent falls, he fell last about 6 months ago. At times, his legs may get weak, and he may collapse. More recently, the patient has had some tinnitus involving the right ear that may come and go. He denies any hearing loss that he knows of. The patient has been on Cymbalta in low dose which has significantly improved his sharp pains that he was having throughout the body, he is off Celexa. He is trying to stay active, he works part-time for [employer]. He wears boots which seemed to help his gait stability. He returns for further evaluation. The patient reports a significant improvement in his diabetes, his hemoglobin A1c is now running around 5.5, compared to around 11 a year ago.  Past Medical History  Diagnosis Date  . Depression   . Back pain   . 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 and Dr. Floyde Parkins  . QT interval     Question prolongation QT interval, October, 2012, review of EKG dated February 04, 2011, QT interval was not prolonged  . Hyperthyroidism     November, 201 to  . Pre-syncope     February 23, 2011  . Murmur     Systolic murmur, December, 2012,No valvular abnormalities, echo, December, 2012  . Anomalous coronary artery origin     The patient underwent cardiac catheterization in 2004.  Coronaries revealed no stenosis.  However he had aberrant takeoff of both the right and left coronaries from the anterior aortic cusp.  There was no evidence of renal artery  stenosis  . Ejection fraction     EF 60-65%, echo, March 18, 2011  . Obesity   . Hypertension   . Dyslipidemia   . H/O radioactive iodine thyroid ablation   . Diabetes mellitus without complication ([place])   . Dyslipidemia   . Broken fibula     right    Past Surgical History  Procedure Laterality Date  . Knee surgery Right   . Tonsillectomy    . Portacath placement      Family History  Problem Relation Age of Onset  . Hypertension Mother   . Cirrhosis Father   . Cirrhosis Maternal Grandmother     non alcoholic  . Heart disease    . Stroke    . Cancer      Social history:  reports that he has never smoked. He has never used smokeless tobacco. He reports that he does not drink alcohol or use illicit drugs.    Allergies  Allergen Reactions  . Toradol [Ketorolac Tromethamine]   . Vioxx [Rofecoxib]   . Rituximab   . Statins     Statins medications results in myalgias    Medications:  Prior to Admission medications   Medication Sig Start Date End Date Taking? Authorizing Provider  ALPRAZolam Duanne Moron) 0.5 MG tablet TAKE 1 TABLET 3 TIMES A DAY AS NEEDED FOR ANXIETY 08/26/15  Yes Kathrynn Ducking, MD  Diaminopyridine POWD MEDICATION IS  10MG  TABLETS....IT IS NOT POWDER....TAKES 3 TABLETS  IN THE MORNING FOR A 30MG  DOSAGE, 2 TABLETS MIDDAY FOR A  20MG  DOSAGE  AND 3 TABLETS AT NIGHT FOR A 30MG  DOSAGE.   Yes Historical Provider, MD  DULoxetine (CYMBALTA) 20 MG capsule TAKE 1 CAPSULE (20 MG TOTAL) BY MOUTH AT BEDTIME. 09/05/15  Yes Kathrynn Ducking, MD  fish oil-omega-3 fatty acids 1000 MG capsule Take 1 g by mouth 3 (three) times daily.     Yes Historical Provider, MD  glucose blood (ONE TOUCH ULTRA TEST) test strip 1 each by Other route 3 (three) times daily. 02/28/13  Yes Deneise Lever, MD  HYDROcodone-acetaminophen (NORCO/VICODIN) 5-325 MG tablet Take 1 tablet by mouth every 6 (six) hours as needed. Must last 28 days. 10/31/15  Yes Kathrynn Ducking, MD  levothyroxine  (SYNTHROID, LEVOTHROID) 125 MCG tablet  10/29/15  Yes Historical Provider, MD  lisinopril (PRINIVIL,ZESTRIL) 5 MG tablet TAKE 1 TABLET (5 MG TOTAL) BY MOUTH DAILY.   Yes Lysbeth Penner, FNP  Multiple Vitamins-Minerals (ONE-A-DAY MENS 50+ ADVANTAGE PO) Take 1 tablet by mouth daily.     Yes Historical Provider, MD  [facility] DELICA LANCETS FINE MISC 1 each by Does not apply route 3 (three) times daily. 02/28/13  Yes Deneise Lever, MD  pyridostigmine (MESTINON) 60 MG tablet TAKE 1 TABLET (60 MG TOTAL) BY MOUTH 3 (THREE) TIMES DAILY. 08/16/15  Yes Kathrynn Ducking, MD  sulfamethoxazole-trimethoprim (BACTRIM DS) 800-160 MG tablet Take 1 tablet by mouth 2 (two) times daily. 02/21/15  Yes Mary-Margaret Hassell Done, FNP  zolpidem (AMBIEN) 10 MG tablet TAKE 1 TABLET BY MOUTH AT BEDTIME AS NEEDED FOR SLEEP 10/07/15  Yes Kathrynn Ducking, MD    ROS:  Out of a complete 14 system review of symptoms, the patient complains only of the following symptoms, and all other reviewed systems are negative.  Fatigue Ringing in the ears Double vision Insomnia, frequent waking, sleep talking, acting out dreams Back pain, muscle cramps, walking difficulty Skin rash, itching Numbness, weakness Agitation, anxiety  Blood pressure 146/86, pulse 65, height 5\' 10"  (1.778 m), weight 229 lb (103.874 kg).  Physical Exam  General: The patient is alert and cooperative at the time of the examination. The patient is moderately obese.  Ears: Tympanic membranes are clear bilaterally.  Skin: No significant peripheral edema is noted.   Neurologic Exam  Mental status: The patient is alert and oriented x 3 at the time of the examination. The patient has apparent normal recent and remote memory, with an apparently normal attention span and concentration ability.   Cranial nerves: Facial symmetry is present. Speech is normal, no aphasia or dysarthria is noted. Extraocular movements are full. Visual fields are full.  Motor: The  patient has good strength in all 4 extremities.  Sensory examination: Soft touch sensation is symmetric on the face, arms, and legs.  Coordination: The patient has good finger-nose-finger and heel-to-shin bilaterally.  Gait and station: The patient has a normal gait. Tandem gait is normal. Romberg is negative. No drift is seen.  Reflexes: Deep tendon reflexes are symmetric, but are depressed.   Assessment/Plan:  1. [name]  2. Anxiety disorder  3. Chronic low back pain  4. Insomnia  5. Right ear tinnitus  I have recommended that the patient may need to see an audiologist to get a hearing check involving the right ear. The patient was given a prescription for the hydrocodone, otherwise his medications have not been altered. He  will follow-up in one year, sooner if needed. The patient has in the past had cramps and diarrhea from the [place]. This has improved using a probiotic. Overall, he has remained stable and he is doing well.  Jill Alexanders MD 10/31/2015 8:49 PM  [facility] [address] [place], [place] [address]  Phone [phone] Fax [phone]

## 2015-11-18 ENCOUNTER — Telehealth: Payer: Self-pay | Admitting: General Practice

## 2015-11-26 ENCOUNTER — Other Ambulatory Visit: Payer: Self-pay | Admitting: Neurology

## 2015-11-26 NOTE — Telephone Encounter (Signed)
Patient requesting refill of HYDROcodone-acetaminophen (NORCO/VICODIN) 5-325 MG tablet Pharmacy: pick up

## 2015-11-27 MED ORDER — HYDROCODONE-ACETAMINOPHEN 5-325 MG PO TABS: 1.0000 | ORAL_TABLET | Freq: Four times a day (QID) | ORAL | 0 refills | Status: DC | PRN

## 2015-11-27 NOTE — Telephone Encounter (Signed)
Last OV 10/31/15 w/ refill given at that time Has follow-up appt scheduled 11/02/16

## 2015-11-27 NOTE — Telephone Encounter (Signed)
Rx printed, signed, up front for pick-up. 

## 2015-11-27 NOTE — Addendum Note (Signed)
Addended by: Monte Fantasia on: 11/27/2015 08:54 AM   Modules accepted: Orders

## 2015-12-24 ENCOUNTER — Telehealth: Payer: Self-pay | Admitting: Neurology

## 2015-12-24 NOTE — Telephone Encounter (Signed)
Rx not due until Thursday 9/14.

## 2015-12-24 NOTE — Telephone Encounter (Signed)
Patient requesting refill of HYDROcodone-acetaminophen (NORCO/VICODIN) 5-325 MG tablet. I advised the Rx will be ready in 24 hours unless the nurse advises otherwise.

## 2015-12-25 DIAGNOSIS — E291 Testicular hypofunction: Secondary | ICD-10-CM | POA: Diagnosis not present

## 2015-12-25 DIAGNOSIS — E7800 Pure hypercholesterolemia, unspecified: Secondary | ICD-10-CM | POA: Diagnosis not present

## 2015-12-25 DIAGNOSIS — E1165 Type 2 diabetes mellitus with hyperglycemia: Secondary | ICD-10-CM | POA: Diagnosis not present

## 2015-12-25 DIAGNOSIS — E890 Postprocedural hypothyroidism: Secondary | ICD-10-CM | POA: Diagnosis not present

## 2015-12-25 MED ORDER — HYDROCODONE-ACETAMINOPHEN 5-325 MG PO TABS: 1.0000 | ORAL_TABLET | Freq: Four times a day (QID) | ORAL | 0 refills | Status: DC | PRN

## 2015-12-25 NOTE — Telephone Encounter (Signed)
Prescription was written for hydrocodone.

## 2015-12-25 NOTE — Telephone Encounter (Signed)
Rx printed, signed, up front for pick-up. 

## 2015-12-25 NOTE — Addendum Note (Signed)
Addended by: Margette Fast on: 12/25/2015 11:23 AM   Modules accepted: Orders

## 2015-12-26 ENCOUNTER — Other Ambulatory Visit: Payer: Self-pay | Admitting: Neurology

## 2015-12-27 ENCOUNTER — Telehealth: Payer: Self-pay | Admitting: Neurology

## 2015-12-27 MED ORDER — ALPRAZOLAM 0.5 MG PO TABS: ORAL_TABLET | ORAL | 5 refills | Status: DC

## 2015-12-27 NOTE — Telephone Encounter (Signed)
I called in a prescription refill for the alprazolam.

## 2015-12-27 NOTE — Telephone Encounter (Signed)
Pt needs renewal on ALPRAZolam (XANAX) 0.5 MG tablet. Pt is getting ready to leave town. Please call pt to let him know its has been called in . Thank you [phone]

## 2016-01-09 DIAGNOSIS — E890 Postprocedural hypothyroidism: Secondary | ICD-10-CM | POA: Diagnosis not present

## 2016-01-09 DIAGNOSIS — I10 Essential (primary) hypertension: Secondary | ICD-10-CM | POA: Diagnosis not present

## 2016-01-09 DIAGNOSIS — E7800 Pure hypercholesterolemia, unspecified: Secondary | ICD-10-CM | POA: Diagnosis not present

## 2016-01-09 DIAGNOSIS — E1165 Type 2 diabetes mellitus with hyperglycemia: Secondary | ICD-10-CM | POA: Diagnosis not present

## 2016-01-22 ENCOUNTER — Other Ambulatory Visit: Payer: Self-pay | Admitting: Neurology

## 2016-01-22 MED ORDER — HYDROCODONE-ACETAMINOPHEN 5-325 MG PO TABS: 1.0000 | ORAL_TABLET | Freq: Four times a day (QID) | ORAL | 0 refills | Status: DC | PRN

## 2016-01-22 NOTE — Telephone Encounter (Signed)
Rx printed, signed, up front for pick-up. 

## 2016-01-22 NOTE — Telephone Encounter (Signed)
Last seen in July w/ 1 yr f/u scheduled for next yr. Most recent rx written 12/25/15.

## 2016-01-22 NOTE — Telephone Encounter (Signed)
Pt request refill for HYDROcodone-acetaminophen (NORCO/VICODIN) 5-325 MG tablet °

## 2016-02-03 ENCOUNTER — Other Ambulatory Visit: Payer: Self-pay | Admitting: Neurology

## 2016-02-04 ENCOUNTER — Telehealth: Payer: Self-pay | Admitting: *Deleted

## 2016-02-04 NOTE — Telephone Encounter (Signed)
Ambien refill faxed and confirmed to [facility] at [phone].

## 2016-02-19 ENCOUNTER — Telehealth: Payer: Self-pay | Admitting: Neurology

## 2016-02-19 MED ORDER — HYDROCODONE-ACETAMINOPHEN 5-325 MG PO TABS: 1.0000 | ORAL_TABLET | Freq: Four times a day (QID) | ORAL | 0 refills | Status: DC | PRN

## 2016-02-19 NOTE — Telephone Encounter (Signed)
Hydrocodone prescription was written.

## 2016-02-19 NOTE — Telephone Encounter (Signed)
Ok for refill? 

## 2016-02-19 NOTE — Telephone Encounter (Signed)
Patient requesting refill of HYDROcodone-acetaminophen (NORCO/VICODIN) 5-325 MG tablet  Pharmacy: pick up

## 2016-02-19 NOTE — Telephone Encounter (Signed)
Rx taken to front desk for p/u.

## 2016-03-04 DIAGNOSIS — G7080 Lambert-Eaton syndrome, unspecified: Secondary | ICD-10-CM | POA: Diagnosis not present

## 2016-03-04 DIAGNOSIS — Z23 Encounter for immunization: Secondary | ICD-10-CM | POA: Diagnosis not present

## 2016-03-04 DIAGNOSIS — Z79899 Other long term (current) drug therapy: Secondary | ICD-10-CM | POA: Diagnosis not present

## 2016-03-05 ENCOUNTER — Other Ambulatory Visit: Payer: Self-pay | Admitting: Neurology

## 2016-03-18 ENCOUNTER — Other Ambulatory Visit: Payer: Self-pay | Admitting: Neurology

## 2016-03-18 MED ORDER — HYDROCODONE-ACETAMINOPHEN 5-325 MG PO TABS: 1.0000 | ORAL_TABLET | Freq: Four times a day (QID) | ORAL | 0 refills | Status: DC | PRN

## 2016-03-18 NOTE — Telephone Encounter (Signed)
Pt was seen in July and has 1 yr f/u scheduled. Last rx written 02/19/16.

## 2016-03-18 NOTE — Telephone Encounter (Signed)
Patient requesting refill of HYDROcodone-acetaminophen (NORCO/VICODIN) 5-325 MG tablet. I advised the Rx will be ready in 24 hours unless the nurse advises otherwise.

## 2016-03-18 NOTE — Addendum Note (Signed)
Addended by: Monte Fantasia on: 03/18/2016 03:41 PM   Modules accepted: Orders

## 2016-03-19 NOTE — Telephone Encounter (Signed)
Spoke to pt and let him know that rx was ready to pick up.

## 2016-03-19 NOTE — Telephone Encounter (Signed)
Rx printed, signed, up front for pick-up. 

## 2016-04-07 ENCOUNTER — Other Ambulatory Visit: Payer: Self-pay | Admitting: Neurology

## 2016-04-08 ENCOUNTER — Other Ambulatory Visit: Payer: Self-pay | Admitting: Neurology

## 2016-04-09 NOTE — Telephone Encounter (Signed)
Rx printed, signed, faxed to pharmacy. 

## 2016-04-16 ENCOUNTER — Other Ambulatory Visit: Payer: Self-pay | Admitting: Neurology

## 2016-04-16 MED ORDER — HYDROCODONE-ACETAMINOPHEN 5-325 MG PO TABS: 1.0000 | ORAL_TABLET | Freq: Four times a day (QID) | ORAL | 0 refills | Status: DC | PRN

## 2016-04-16 NOTE — Telephone Encounter (Signed)
Rx printed, signed, up front for pick-up. 

## 2016-04-16 NOTE — Telephone Encounter (Signed)
Patient requesting refill of HYDROcodone-acetaminophen (NORCO/VICODIN) 5-325 MG tablet. ° ° °

## 2016-04-16 NOTE — Addendum Note (Signed)
Addended by: Monte Fantasia on: 04/16/2016 10:30 AM   Modules accepted: Orders

## 2016-04-16 NOTE — Telephone Encounter (Signed)
Pt seen for OV in July and has 1 yr follow-up scheduled. Last rx written 03/18/16.

## 2016-05-14 ENCOUNTER — Other Ambulatory Visit: Payer: Self-pay | Admitting: Neurology

## 2016-05-14 MED ORDER — HYDROCODONE-ACETAMINOPHEN 5-325 MG PO TABS: 1.0000 | ORAL_TABLET | Freq: Four times a day (QID) | ORAL | 0 refills | Status: DC | PRN

## 2016-05-14 NOTE — Addendum Note (Signed)
Addended by: Monte Fantasia on: 05/14/2016 11:23 AM   Modules accepted: Orders

## 2016-05-14 NOTE — Telephone Encounter (Signed)
Rx printed, signed, up front for pick-up. 

## 2016-05-14 NOTE — Telephone Encounter (Signed)
Pt was last seen in July for OV and does have 1 yr follow-up appt scheduled. Last rx written 04/16/16.

## 2016-05-14 NOTE — Telephone Encounter (Signed)
Patient called requesting refill for HYDROcodone-acetaminophen (NORCO/VICODIN) 5-325 MG tablet °

## 2016-05-20 DIAGNOSIS — E890 Postprocedural hypothyroidism: Secondary | ICD-10-CM | POA: Diagnosis not present

## 2016-06-03 ENCOUNTER — Other Ambulatory Visit: Payer: Self-pay | Admitting: Neurology

## 2016-06-03 NOTE — Telephone Encounter (Signed)
Faxed printed/signed rx zolpidem to pt pharmacy. Fax: [phone]. Received confirmation.  

## 2016-06-11 ENCOUNTER — Telehealth: Payer: Self-pay | Admitting: Neurology

## 2016-06-11 MED ORDER — HYDROCODONE-ACETAMINOPHEN 5-325 MG PO TABS: 1.0000 | ORAL_TABLET | Freq: Four times a day (QID) | ORAL | 0 refills | Status: DC | PRN

## 2016-06-11 NOTE — Addendum Note (Signed)
Addended by: Margette Fast on: 06/11/2016 03:46 PM   Modules accepted: Orders

## 2016-06-11 NOTE — Telephone Encounter (Signed)
Patient requesting refill of HYDROcodone-acetaminophen (NORCO/VICODIN) 5-325 MG tab

## 2016-06-11 NOTE — Telephone Encounter (Signed)
Placed printed/signed rx up front for patient pick up.  

## 2016-06-11 NOTE — Telephone Encounter (Signed)
Okay to refill hydrocodone

## 2016-06-11 NOTE — Telephone Encounter (Signed)
Dr Jannifer Franklin- ok to refill? He last received rx 05/14/16 qty 60, no refills Patient last seen 10/31/15, his next follow up with you is 11/02/16.

## 2016-06-16 ENCOUNTER — Other Ambulatory Visit: Payer: Self-pay | Admitting: Neurology

## 2016-06-19 ENCOUNTER — Other Ambulatory Visit: Payer: Self-pay | Admitting: Neurology

## 2016-07-02 DIAGNOSIS — E890 Postprocedural hypothyroidism: Secondary | ICD-10-CM | POA: Diagnosis not present

## 2016-07-02 DIAGNOSIS — E7800 Pure hypercholesterolemia, unspecified: Secondary | ICD-10-CM | POA: Diagnosis not present

## 2016-07-02 DIAGNOSIS — E291 Testicular hypofunction: Secondary | ICD-10-CM | POA: Diagnosis not present

## 2016-07-02 DIAGNOSIS — E1165 Type 2 diabetes mellitus with hyperglycemia: Secondary | ICD-10-CM | POA: Diagnosis not present

## 2016-07-07 DIAGNOSIS — E890 Postprocedural hypothyroidism: Secondary | ICD-10-CM | POA: Diagnosis not present

## 2016-07-07 DIAGNOSIS — E7800 Pure hypercholesterolemia, unspecified: Secondary | ICD-10-CM | POA: Diagnosis not present

## 2016-07-07 DIAGNOSIS — I10 Essential (primary) hypertension: Secondary | ICD-10-CM | POA: Diagnosis not present

## 2016-07-07 DIAGNOSIS — E1165 Type 2 diabetes mellitus with hyperglycemia: Secondary | ICD-10-CM | POA: Diagnosis not present

## 2016-07-08 ENCOUNTER — Telehealth: Payer: Self-pay | Admitting: Neurology

## 2016-07-08 MED ORDER — HYDROCODONE-ACETAMINOPHEN 5-325 MG PO TABS: 1.0000 | ORAL_TABLET | Freq: Four times a day (QID) | ORAL | 0 refills | Status: DC | PRN

## 2016-07-08 NOTE — Telephone Encounter (Signed)
Placed printed/signed rx up front for patient pick up.  

## 2016-07-08 NOTE — Telephone Encounter (Signed)
Hydrocodone prescription will be refilled, the [facility] registry was checked.

## 2016-07-08 NOTE — Telephone Encounter (Signed)
Pt called for refill on HYDROcodone-acetaminophen (NORCO/VICODIN) 5-325 MG tablet can be reached on mobile if need be

## 2016-07-08 NOTE — Addendum Note (Signed)
Addended by: Kathrynn Ducking on: 07/08/2016 02:48 PM   Modules accepted: Orders

## 2016-07-16 DIAGNOSIS — E1165 Type 2 diabetes mellitus with hyperglycemia: Secondary | ICD-10-CM | POA: Diagnosis not present

## 2016-07-16 DIAGNOSIS — E890 Postprocedural hypothyroidism: Secondary | ICD-10-CM | POA: Diagnosis not present

## 2016-07-16 DIAGNOSIS — E291 Testicular hypofunction: Secondary | ICD-10-CM | POA: Diagnosis not present

## 2016-07-16 DIAGNOSIS — E7800 Pure hypercholesterolemia, unspecified: Secondary | ICD-10-CM | POA: Diagnosis not present

## 2016-07-30 ENCOUNTER — Other Ambulatory Visit: Payer: Self-pay | Admitting: Neurology

## 2016-08-03 NOTE — Telephone Encounter (Signed)
Faxed printed/signed rx alprazolam to pt pharmacy. Fax: [phone]. Received confirmation.

## 2016-08-04 ENCOUNTER — Other Ambulatory Visit: Payer: Self-pay | Admitting: Neurology

## 2016-08-05 NOTE — Telephone Encounter (Signed)
Faxed printed/signed rx zolpidem to pt pharmacy. Fax: [phone]. Received confirmation.

## 2016-08-06 ENCOUNTER — Telehealth: Payer: Self-pay | Admitting: Neurology

## 2016-08-06 MED ORDER — HYDROCODONE-ACETAMINOPHEN 5-325 MG PO TABS: 1.0000 | ORAL_TABLET | Freq: Four times a day (QID) | ORAL | 0 refills | Status: DC | PRN

## 2016-08-06 NOTE — Telephone Encounter (Signed)
Pt called for refill of HYDROcodone-acetaminophen (NORCO/VICODIN) 5-325 MG tablet mobile is best number to reach pt on

## 2016-08-06 NOTE — Telephone Encounter (Signed)
The [email or web address] registry was checked, hydrocodone will be refilled.

## 2016-08-06 NOTE — Addendum Note (Signed)
Addended by: Kathrynn Ducking on: 08/06/2016 01:06 PM   Modules accepted: Orders

## 2016-08-06 NOTE — Telephone Encounter (Signed)
Placed printed/signed rx up front for patient pick up.  

## 2016-08-07 ENCOUNTER — Telehealth: Payer: Self-pay | Admitting: *Deleted

## 2016-08-07 NOTE — Telephone Encounter (Signed)
Dr Jaynee Eagles on call this am. Received message from patient wanting to know if rx hydrocodone ready for pick up. Called patient on mobile and advised rx ready for pick up. Open until 12pm. He verbalized understanding.

## 2016-09-02 ENCOUNTER — Telehealth: Payer: Self-pay | Admitting: Neurology

## 2016-09-02 ENCOUNTER — Other Ambulatory Visit: Payer: Self-pay | Admitting: Neurology

## 2016-09-02 MED ORDER — HYDROCODONE-ACETAMINOPHEN 5-325 MG PO TABS: 1.0000 | ORAL_TABLET | Freq: Four times a day (QID) | ORAL | 0 refills | Status: DC | PRN

## 2016-09-02 NOTE — Telephone Encounter (Signed)
The hydrocodone will be refilled. 

## 2016-09-02 NOTE — Telephone Encounter (Signed)
Pt request refill for HYDROcodone-acetaminophen (NORCO/VICODIN) 5-325 MG tablet °

## 2016-10-01 ENCOUNTER — Telehealth: Payer: Self-pay | Admitting: Neurology

## 2016-10-01 MED ORDER — HYDROCODONE-ACETAMINOPHEN 5-325 MG PO TABS: 1.0000 | ORAL_TABLET | Freq: Four times a day (QID) | ORAL | 0 refills | Status: DC | PRN

## 2016-10-01 NOTE — Telephone Encounter (Signed)
Patient called office requesting refill for HYDROcodone-acetaminophen (NORCO/VICODIN) 5-325 MG tablet. Patient advised office closes at noon on Friday.

## 2016-10-01 NOTE — Addendum Note (Signed)
Addended by: Kathrynn Ducking on: 10/01/2016 04:38 PM   Modules accepted: Orders

## 2016-10-01 NOTE — Telephone Encounter (Signed)
The hydrocodone will be refilled. 

## 2016-11-02 ENCOUNTER — Ambulatory Visit (INDEPENDENT_AMBULATORY_CARE_PROVIDER_SITE_OTHER): Payer: Medicare Other | Admitting: Neurology

## 2016-11-02 ENCOUNTER — Encounter: Payer: Self-pay | Admitting: Neurology

## 2016-11-02 VITALS — BP 146/83 | HR 64 | Ht 70.0 in | Wt 219.0 lb

## 2016-11-02 DIAGNOSIS — G7080 Lambert-Eaton syndrome, unspecified: Secondary | ICD-10-CM | POA: Diagnosis not present

## 2016-11-02 MED ORDER — HYDROCODONE-ACETAMINOPHEN 5-325 MG PO TABS: 1.0000 | ORAL_TABLET | Freq: Four times a day (QID) | ORAL | 0 refills | Status: DC | PRN

## 2016-11-02 MED ORDER — SUVOREXANT 15 MG PO TABS: 15.0000 mg | ORAL_TABLET | Freq: Every evening | ORAL | 2 refills | Status: DC | PRN

## 2016-11-02 MED ORDER — ZOLPIDEM TARTRATE 5 MG PO TABS: 5.0000 mg | ORAL_TABLET | Freq: Every evening | ORAL | 2 refills | Status: DC | PRN

## 2016-11-02 NOTE — Patient Instructions (Signed)
   We will try to cut back on the Ambien to 5 mg and add Belsomra 15 mg at night, if this seems to work out, we will eventually stop the Ambien and go to a 20 mg Belsomra.

## 2016-11-02 NOTE — Progress Notes (Signed)
Reason for visit: [name] is an 56 y.o. male  History of present illness:  [name] is a 56 year old right-handed white male with a history of [name]. The patient is followed as well through [facility], he is on 20 mg of 3-4 DAP 3 times daily. The patient also takes Mestinon. The patient has been relatively stable with his muscle weakness, the summertime is worse for him as it seems to make him quite weak. The patient has recently been diagnosed with diabetes, he is now on insulin and he is on a low carbohydrate diet and he exercises. He has lost over 20 pounds in the last several months. He feels better, but he still has significant fatigue during the hot summer months. The patient has not had any recent falls, he has stiffness in the arms and legs when he first wakes up in the morning, he will stretch out before he goes for a morning walk. He continues to have low back pain, he uses hydrocodone and a TENS unit to help. He may occasionally have back spasms. The patient continues to have chronic insomnia, he has alprazolam for anxiety and takes Ambien at night for sleep. He sleeps only about 4 hours a night. He returns to this office for an evaluation. He has been under a lot of emotional stress as his daughter has had a sex change operation to a male. This has created a lot of stress within the family. The patient is seeing a counselor to help him out with the stress.   Past Medical History:  Diagnosis Date  . Anomalous coronary artery origin    The patient underwent cardiac catheterization in 2004.  Coronaries revealed no stenosis.  However he had aberrant takeoff of both the right and left coronaries from the anterior aortic cusp.  There was no evidence of renal artery stenosis  . Back pain   . Broken fibula    right  . Depression   . Diabetes mellitus without complication ([place])   . Dyslipidemia   . Dyslipidemia   . Ejection fraction     EF 60-65%, echo, March 18, 2011  . H/O radioactive iodine thyroid ablation   . Hypertension   . Hyperthyroidism    November, 201 to  . 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 and Dr. Floyde Parkins  . Murmur    Systolic murmur, December, 2012,No valvular abnormalities, echo, December, 2012  . Obesity   . Pre-syncope    February 23, 2011  . QT interval    Question prolongation QT interval, October, 2012, review of EKG dated February 04, 2011, QT interval was not prolonged    Past Surgical History:  Procedure Laterality Date  . KNEE SURGERY Right   . PORTACATH PLACEMENT    . TONSILLECTOMY      Family History  Problem Relation Age of Onset  . Hypertension Mother   . Cirrhosis Father   . Cirrhosis Maternal Grandmother        non alcoholic  . Heart disease Unknown   . Stroke Unknown   . Cancer Unknown     Social history:  reports that he has never smoked. He has never used smokeless tobacco. He reports that he does not drink alcohol or use drugs.    Allergies  Allergen Reactions  . Toradol [Ketorolac Tromethamine]   . Vioxx [Rofecoxib]   . Rituximab   . Statins     Statins  medications results in myalgias    Medications:  Prior to Admission medications   Medication Sig Start Date End Date Taking? Authorizing Provider  ALPRAZolam Duanne Moron) 0.5 MG tablet TAKE 1 TABLET 3 TIMES A DAY AS NEEDED 08/03/16  Yes Kathrynn Ducking, MD  Diaminopyridine POWD MEDICATION IS 10MG  TABLETS....IT IS NOT POWDER....TAKES 3 TABLETS  IN THE MORNING FOR A 30MG  DOSAGE, 2 TABLETS MIDDAY FOR A  20MG  DOSAGE  AND 3 TABLETS AT NIGHT FOR A 30MG  DOSAGE.   Yes [provider]  DULoxetine (CYMBALTA) 20 MG capsule TAKE 1 CAPSULE (20 MG TOTAL) BY MOUTH AT BEDTIME. 09/02/16  Yes Kathrynn Ducking, MD  fish oil-omega-3 fatty acids 1000 MG capsule Take 1 g by mouth 3 (three) times daily.     Yes [provider]  glucose blood (ONE TOUCH ULTRA TEST) test strip 1 each by  Other route 3 (three) times daily. 02/28/13  Yes Deneise Lever, MD  HYDROcodone-acetaminophen (NORCO/VICODIN) 5-325 MG tablet Take 1 tablet by mouth every 6 (six) hours as needed. Must last 28 days. 10/01/16  Yes Kathrynn Ducking, MD  insulin detemir (LEVEMIR) 100 UNIT/ML injection Inject 10 Units into the skin at bedtime.   Yes [provider]  levothyroxine (SYNTHROID, LEVOTHROID) 125 MCG tablet  10/29/15  Yes [provider]  lisinopril (PRINIVIL,ZESTRIL) 5 MG tablet TAKE 1 TABLET (5 MG TOTAL) BY MOUTH DAILY.   Yes Lysbeth Penner, FNP  metFORMIN (GLUCOPHAGE-XR) 500 MG 24 hr tablet Take 500 mg by mouth 2 (two) times daily.   Yes [provider]  Multiple Vitamins-Minerals (ONE-A-DAY MENS 50+ ADVANTAGE PO) Take 1 tablet by mouth daily.     Yes [provider]  NOVOLOG FLEXPEN 100 UNIT/ML FlexPen INJECT 10 UNITS THREE TIMES DAILY SUBCUTANEOUS 30 DAYS 10/08/16  Yes [provider]  [place] 1 each by Does not apply route 3 (three) times daily. 02/28/13  Yes Deneise Lever, MD  pyridostigmine (MESTINON) 60 MG tablet TAKE 1 TABLET (60 MG TOTAL) BY MOUTH 3 (THREE) TIMES DAILY. 06/16/16  Yes Kathrynn Ducking, MD  zolpidem (AMBIEN) 10 MG tablet TAKE 1 TABLET BY MOUTH AT BEDTIME AS NEEDED FOR SLEEP 08/05/16  Yes Kathrynn Ducking, MD    ROS:  Out of a complete 14 system review of symptoms, the patient complains only of the following symptoms, and all other reviewed systems are negative.  Double vision Heat intolerance Insomnia Joint pain, back pain Numbness, weakness Depression, anxiety  Blood pressure (!) 146/83, pulse 64, height 5\' 10"  (1.778 m), weight 219 lb (99.3 kg).  Physical Exam  General: The patient is alert and cooperative at the time of the examination.  Skin: No significant peripheral edema is noted.   Neurologic Exam  Mental status: The patient is alert and oriented x 3 at the time of the examination.  The patient has apparent normal recent and remote memory, with an apparently normal attention span and concentration ability.   Cranial nerves: Facial symmetry is present. Speech is normal, no aphasia or dysarthria is noted. Extraocular movements are full. Visual fields are full.  Motor: The patient has good strength in all 4 extremities.  Sensory examination: Soft touch sensation is symmetric on the face, arms, and legs.  Coordination: The patient has good finger-nose-finger and heel-to-shin bilaterally.  Gait and station: The patient has a normal gait. Tandem gait is normal. Romberg is negative. No drift is seen.  Reflexes: Deep tendon reflexes are  symmetric.   Assessment/Plan:  1. [name]  2. Chronic low back pain  3. Chronic insomnia  The patient was given a prescription for hydrocodone today. We will reduce the Ambien to 5 mg at night, add Belsomra at 15 mg at night. He does well with this regimen, we will stop the Ambien after several months and go to Belsomra taking 20 mg at night. He will follow-up in one year, sooner if needed. He seems to be stable with his [name]. The hot summer months do make him weaker.  Jill Alexanders MD 11/02/2016 7:50 AM  [facility] [address] [place], [place] [address]  Phone [phone] Fax [phone]

## 2016-11-03 ENCOUNTER — Other Ambulatory Visit: Payer: Self-pay | Admitting: Neurology

## 2016-11-10 DIAGNOSIS — E291 Testicular hypofunction: Secondary | ICD-10-CM | POA: Diagnosis not present

## 2016-11-10 DIAGNOSIS — E1165 Type 2 diabetes mellitus with hyperglycemia: Secondary | ICD-10-CM | POA: Diagnosis not present

## 2016-11-10 DIAGNOSIS — E7800 Pure hypercholesterolemia, unspecified: Secondary | ICD-10-CM | POA: Diagnosis not present

## 2016-11-10 DIAGNOSIS — E890 Postprocedural hypothyroidism: Secondary | ICD-10-CM | POA: Diagnosis not present

## 2016-11-17 DIAGNOSIS — I10 Essential (primary) hypertension: Secondary | ICD-10-CM | POA: Diagnosis not present

## 2016-11-17 DIAGNOSIS — E7800 Pure hypercholesterolemia, unspecified: Secondary | ICD-10-CM | POA: Diagnosis not present

## 2016-11-17 DIAGNOSIS — E1165 Type 2 diabetes mellitus with hyperglycemia: Secondary | ICD-10-CM | POA: Diagnosis not present

## 2016-11-17 DIAGNOSIS — E890 Postprocedural hypothyroidism: Secondary | ICD-10-CM | POA: Diagnosis not present

## 2016-11-30 ENCOUNTER — Telehealth: Payer: Self-pay | Admitting: Neurology

## 2016-11-30 MED ORDER — HYDROCODONE-ACETAMINOPHEN 5-325 MG PO TABS: 1.0000 | ORAL_TABLET | Freq: Four times a day (QID) | ORAL | 0 refills | Status: DC | PRN

## 2016-11-30 NOTE — Telephone Encounter (Signed)
Patient called requesting refill for HYDROcodone-acetaminophen (NORCO/VICODIN) 5-325 MG tablet °

## 2016-11-30 NOTE — Addendum Note (Signed)
Addended by: Kathrynn Ducking on: 11/30/2016 10:34 AM   Modules accepted: Orders

## 2016-11-30 NOTE — Telephone Encounter (Signed)
The hydrocodone will be refilled. 

## 2016-11-30 NOTE — Telephone Encounter (Signed)
Placed printed/signed rx up front for patient pick up.  

## 2016-12-01 ENCOUNTER — Telehealth: Payer: Self-pay | Admitting: Neurology

## 2016-12-01 NOTE — Telephone Encounter (Signed)
I called patient. The patient has had a Port-A-Cath in place for years. He has had some irritation around the cath, he may have strained his neck with physical activity recently. He has been trying to pad the area to keep her from getting irritated, if this does not improve the discomfort in the next couple weeks, he is to call back and we will make a thoracic surgery referral for removal of the cath.

## 2016-12-01 NOTE — Telephone Encounter (Signed)
Patient called office in reference to having his portacath for the IVIG treatments he was getting weekly years ago.  Patient states port is bothering him and would like to know what he can do about getting it removed.  Please call

## 2016-12-22 DIAGNOSIS — E890 Postprocedural hypothyroidism: Secondary | ICD-10-CM | POA: Diagnosis not present

## 2016-12-29 ENCOUNTER — Telehealth: Payer: Self-pay | Admitting: Neurology

## 2016-12-29 MED ORDER — HYDROCODONE-ACETAMINOPHEN 5-325 MG PO TABS: 1.0000 | ORAL_TABLET | Freq: Four times a day (QID) | ORAL | 0 refills | Status: DC | PRN

## 2016-12-29 NOTE — Telephone Encounter (Signed)
Hydrocodone will be refilled. 

## 2016-12-29 NOTE — Telephone Encounter (Signed)
Placed printed/signed rx up front for patient pick up.  

## 2016-12-29 NOTE — Addendum Note (Signed)
Addended by: Kathrynn Ducking on: 12/29/2016 02:35 PM   Modules accepted: Orders

## 2016-12-29 NOTE — Telephone Encounter (Signed)
Patient called office requesting refill for HYDROcodone-acetaminophen (NORCO/VICODIN) 5-325 MG tablet.

## 2017-01-03 ENCOUNTER — Other Ambulatory Visit: Payer: Self-pay | Admitting: Neurology

## 2017-01-04 NOTE — Telephone Encounter (Signed)
Faxed printed/signed rx alprazolam to [place], [place] at [phone]. Received fax confirmation.

## 2017-01-25 NOTE — Telephone Encounter (Signed)
Close Encounter 

## 2017-01-26 ENCOUNTER — Telehealth: Payer: Self-pay | Admitting: Neurology

## 2017-01-26 MED ORDER — HYDROCODONE-ACETAMINOPHEN 5-325 MG PO TABS: 1.0000 | ORAL_TABLET | Freq: Four times a day (QID) | ORAL | 0 refills | Status: DC | PRN

## 2017-01-26 NOTE — Telephone Encounter (Signed)
The [place] registry was checked, the hydrocodone will be refilled. 

## 2017-01-26 NOTE — Telephone Encounter (Signed)
Pt request refill for HYDROcodone-acetaminophen (NORCO/VICODIN) 5-325 MG tablet

## 2017-01-27 NOTE — Telephone Encounter (Signed)
Placed printed/signed rx up front for patient pick up.  

## 2017-02-09 ENCOUNTER — Other Ambulatory Visit: Payer: Self-pay | Admitting: Neurology

## 2017-02-09 ENCOUNTER — Telehealth: Payer: Self-pay | Admitting: Neurology

## 2017-02-09 MED ORDER — SUVOREXANT 15 MG PO TABS: 15.0000 mg | ORAL_TABLET | Freq: Every evening | ORAL | 2 refills | Status: DC | PRN

## 2017-02-09 NOTE — Telephone Encounter (Signed)
Called and spoke with pt. Advised per Dr Jannifer Franklin last OV note, he was going to have him take ambien 5mg  at night along with Belsomra 15mg . Then,if he did well he was going to switch to just the Belsomra 20mg  at night. Pt stated he was not able to fill the Belsomra at pharmacy. They told him he could not d/t taking ambien.Could not have both. He stated his pharmacy was supposed to have contacted out office.  He has been out of the [place] the last 4 days. He has been out of town.  He states ambien still ineffective. He would like to try Belsomra if possible still. Advised I will send message back to CW,MD to advise.

## 2017-02-09 NOTE — Telephone Encounter (Signed)
I called the patient.  The Ambien is not effective for sleep, we will try Belsomra.  He cannot get both medications at the same time.  Called in another prescription for the [place].

## 2017-02-10 NOTE — Telephone Encounter (Signed)
Faxed printed/signed rx Belsomra to [place], [place] at [phone]. Received fax confirmation.

## 2017-02-18 MED ORDER — ZOLPIDEM TARTRATE 10 MG PO TABS: 10.0000 mg | ORAL_TABLET | Freq: Every evening | ORAL | 3 refills | Status: DC | PRN

## 2017-02-18 NOTE — Addendum Note (Signed)
Addended by: Kathrynn Ducking on: 02/18/2017 06:12 PM   Modules accepted: Orders

## 2017-02-18 NOTE — Telephone Encounter (Signed)
I called the patient.  The Belsomra does not work for him, we will switch back to the Ambien, and increase to 10 mg at night.

## 2017-02-18 NOTE — Telephone Encounter (Addendum)
Pt called said he is sleeping about 2 hrs/night. He is not sure if doseage should be increased. Pt said it is taking him a long time to go to sleep and does not go into a deep sleep.  Please call to advise at [phone]

## 2017-02-19 NOTE — Telephone Encounter (Signed)
Rx [place] faxed to [place], [place] at [phone]. Received fax confirmation. Asked that rx Belsomra be d/c'd.

## 2017-02-23 ENCOUNTER — Telehealth: Payer: Self-pay | Admitting: Neurology

## 2017-02-23 MED ORDER — HYDROCODONE-ACETAMINOPHEN 5-325 MG PO TABS: 1.0000 | ORAL_TABLET | Freq: Four times a day (QID) | ORAL | 0 refills | Status: DC | PRN

## 2017-02-23 NOTE — Telephone Encounter (Signed)
Placed printed/signed rx up front for patient pick up.  

## 2017-02-23 NOTE — Telephone Encounter (Signed)
Pt request refill for HYDROcodone-acetaminophen (NORCO/VICODIN) 5-325 MG tablet

## 2017-02-23 NOTE — Telephone Encounter (Signed)
The hydrocodone will be refilled,  registry was checked. 

## 2017-03-24 ENCOUNTER — Telehealth: Payer: Self-pay | Admitting: Neurology

## 2017-03-24 DIAGNOSIS — Z79899 Other long term (current) drug therapy: Secondary | ICD-10-CM | POA: Diagnosis not present

## 2017-03-24 DIAGNOSIS — G7080 Lambert-Eaton syndrome, unspecified: Secondary | ICD-10-CM | POA: Diagnosis not present

## 2017-03-24 DIAGNOSIS — Z23 Encounter for immunization: Secondary | ICD-10-CM | POA: Diagnosis not present

## 2017-03-24 MED ORDER — HYDROCODONE-ACETAMINOPHEN 5-325 MG PO TABS: 1.0000 | ORAL_TABLET | Freq: Four times a day (QID) | ORAL | 0 refills | Status: DC | PRN

## 2017-03-24 NOTE — Telephone Encounter (Signed)
Pt calling for a refill of HYDROcodone-acetaminophen (NORCO/VICODIN) 5-325 MG tablet

## 2017-03-24 NOTE — Telephone Encounter (Signed)
Placed printed/signed rx up front for patient pick up.  

## 2017-03-24 NOTE — Addendum Note (Signed)
Addended by: Kathrynn Ducking on: 03/24/2017 12:46 PM   Modules accepted: Orders

## 2017-03-24 NOTE — Telephone Encounter (Signed)
The hydrocodone will be refilled. 

## 2017-03-25 ENCOUNTER — Other Ambulatory Visit: Payer: Self-pay | Admitting: Neurology

## 2017-04-22 ENCOUNTER — Telehealth: Payer: Self-pay | Admitting: Neurology

## 2017-04-22 MED ORDER — HYDROCODONE-ACETAMINOPHEN 5-325 MG PO TABS: 1.0000 | ORAL_TABLET | Freq: Four times a day (QID) | ORAL | 0 refills | Status: DC | PRN

## 2017-04-22 NOTE — Telephone Encounter (Signed)
The prescription for hydrocodone will be refilled. 

## 2017-04-22 NOTE — Telephone Encounter (Signed)
Pt asking for a written Rx for HYDROcodone-acetaminophen (NORCO/VICODIN) 5-325 MG tablet, no changes to his insurance.  Pt made aware office closes at 12 noon on Fridaysem

## 2017-04-22 NOTE — Addendum Note (Signed)
Addended by: Kathrynn Ducking on: 04/22/2017 10:52 AM   Modules accepted: Orders

## 2017-04-22 NOTE — Telephone Encounter (Signed)
Placed printed/signed rx up front for patient pick up.  

## 2017-05-13 DIAGNOSIS — E1165 Type 2 diabetes mellitus with hyperglycemia: Secondary | ICD-10-CM | POA: Diagnosis not present

## 2017-05-13 DIAGNOSIS — E7800 Pure hypercholesterolemia, unspecified: Secondary | ICD-10-CM | POA: Diagnosis not present

## 2017-05-13 DIAGNOSIS — E890 Postprocedural hypothyroidism: Secondary | ICD-10-CM | POA: Diagnosis not present

## 2017-05-13 DIAGNOSIS — E291 Testicular hypofunction: Secondary | ICD-10-CM | POA: Diagnosis not present

## 2017-05-19 ENCOUNTER — Telehealth: Payer: Self-pay | Admitting: Neurology

## 2017-05-19 NOTE — Telephone Encounter (Signed)
The hydrocodone prescription is due on 7 February, I will fill the prescription then.  The [facility] registry was checked.

## 2017-05-19 NOTE — Telephone Encounter (Signed)
Pt request refill for HYDROcodone-acetaminophen (NORCO/VICODIN) 5-325 MG tablet. Pt is wanting to pick up RX tomorrow, he will be in town instead of Friday. If there is a problem with this please call him

## 2017-05-20 ENCOUNTER — Other Ambulatory Visit: Payer: Self-pay | Admitting: Neurology

## 2017-05-20 MED ORDER — HYDROCODONE-ACETAMINOPHEN 5-325 MG PO TABS: 1.0000 | ORAL_TABLET | Freq: Four times a day (QID) | ORAL | 0 refills | Status: DC | PRN

## 2017-05-20 NOTE — Addendum Note (Signed)
Addended by: Kathrynn Ducking on: 05/20/2017 08:31 AM   Modules accepted: Orders

## 2017-05-20 NOTE — Telephone Encounter (Signed)
Placed printed/signed rx up front for patient pick up.  

## 2017-05-20 NOTE — Telephone Encounter (Signed)
Faxed printed/signed rx alprazolam to [facility] at [phone]. Received fax confirmation.

## 2017-05-24 DIAGNOSIS — E890 Postprocedural hypothyroidism: Secondary | ICD-10-CM | POA: Diagnosis not present

## 2017-05-24 DIAGNOSIS — E7800 Pure hypercholesterolemia, unspecified: Secondary | ICD-10-CM | POA: Diagnosis not present

## 2017-05-24 DIAGNOSIS — E291 Testicular hypofunction: Secondary | ICD-10-CM | POA: Diagnosis not present

## 2017-05-24 DIAGNOSIS — I10 Essential (primary) hypertension: Secondary | ICD-10-CM | POA: Diagnosis not present

## 2017-05-24 DIAGNOSIS — E1165 Type 2 diabetes mellitus with hyperglycemia: Secondary | ICD-10-CM | POA: Diagnosis not present

## 2017-06-15 ENCOUNTER — Telehealth: Payer: Self-pay | Admitting: Neurology

## 2017-06-15 ENCOUNTER — Encounter: Payer: Self-pay | Admitting: *Deleted

## 2017-06-15 NOTE — Telephone Encounter (Signed)
The hydrocodone prescription is not due until 7 March.  The [facility] registry was checked.

## 2017-06-15 NOTE — Telephone Encounter (Signed)
Called and spoke with pt. Advised his refill not due until 06/17/17.  We can now send rx directly to his pharmacy. He will send on 06/17/17. He verbalized understanding.   He [facility] uses [facility] not [facility]. I updated this.   He wanted to let Dr. Jannifer Franklin know his diabetes now under great control and able to go off all meds for diabetes. Recent A1C 5.7. I updated pt med list.  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 He is trying to get patient assistance to pay for medication. Copay very high for him.

## 2017-06-15 NOTE — Telephone Encounter (Signed)
Patient requesting refill of HYDROcodone-acetaminophen (NORCO/VICODIN) 5-325 MG tablet. ° ° °

## 2017-06-17 ENCOUNTER — Telehealth: Payer: Self-pay | Admitting: Neurology

## 2017-06-17 MED ORDER — ZOLPIDEM TARTRATE 10 MG PO TABS: 10.0000 mg | ORAL_TABLET | Freq: Every evening | ORAL | 3 refills | Status: DC | PRN

## 2017-06-17 MED ORDER — HYDROCODONE-ACETAMINOPHEN 5-325 MG PO TABS: 1.0000 | ORAL_TABLET | Freq: Four times a day (QID) | ORAL | 0 refills | Status: DC | PRN

## 2017-06-17 NOTE — Telephone Encounter (Signed)
Stew from  [place], [place] [phone] (Phone) [phone] (Fax)   Is asking if a prescription for zolpidem (AMBIEN) 10 MG tablet(Expired)can be sent over since last prescription has expired from previous pharmacy

## 2017-06-17 NOTE — Telephone Encounter (Signed)
Rx was sent  

## 2017-06-17 NOTE — Addendum Note (Signed)
Addended by: Kathrynn Ducking on: 06/17/2017 07:26 AM   Modules accepted: Orders

## 2017-06-17 NOTE — Addendum Note (Signed)
Addended by: Kathrynn Ducking on: 06/17/2017 06:21 PM   Modules accepted: Orders

## 2017-07-15 ENCOUNTER — Other Ambulatory Visit: Payer: Self-pay | Admitting: Neurology

## 2017-08-05 ENCOUNTER — Telehealth: Payer: Self-pay | Admitting: Neurology

## 2017-08-05 NOTE — Telephone Encounter (Signed)
I called the patient.  The patient is due for his hydrocodone on 12 Aug 2017.  He will be out of town, on the [place] of [place].  I indicated that he needs to find a pharmacy down there, call us with the pharmacy name and location, and I will E prescribe the hydrocodone on 2 May when the medication is due.

## 2017-08-05 NOTE — Telephone Encounter (Signed)
Pt has called re: his HYDROcodone-acetaminophen (NORCO/VICODIN) 5-325 MG tablet.  Pt is going out of town tomorrow and will not return until the following Sunday.  Pt states he believes he has enough to last him until 04-29.  Pt is asking  What if anything can be done since he has been on this medication for about 14 years, please call

## 2017-08-11 ENCOUNTER — Other Ambulatory Visit: Payer: Self-pay | Admitting: Neurology

## 2017-08-12 ENCOUNTER — Other Ambulatory Visit: Payer: Self-pay | Admitting: Neurology

## 2017-09-01 DIAGNOSIS — R899 Unspecified abnormal finding in specimens from other organs, systems and tissues: Secondary | ICD-10-CM | POA: Diagnosis not present

## 2017-09-01 DIAGNOSIS — Z79899 Other long term (current) drug therapy: Secondary | ICD-10-CM | POA: Diagnosis not present

## 2017-09-01 DIAGNOSIS — G7080 Lambert-Eaton syndrome, unspecified: Secondary | ICD-10-CM | POA: Diagnosis not present

## 2017-09-01 DIAGNOSIS — M791 Myalgia, unspecified site: Secondary | ICD-10-CM | POA: Diagnosis not present

## 2017-09-01 DIAGNOSIS — E0500 Thyrotoxicosis with diffuse goiter without thyrotoxic crisis or storm: Secondary | ICD-10-CM | POA: Diagnosis not present

## 2017-09-01 DIAGNOSIS — E060 Acute thyroiditis: Secondary | ICD-10-CM | POA: Diagnosis not present

## 2017-09-08 ENCOUNTER — Other Ambulatory Visit: Payer: Self-pay | Admitting: Neurology

## 2017-10-01 ENCOUNTER — Other Ambulatory Visit: Payer: Self-pay | Admitting: Neurology

## 2017-10-07 ENCOUNTER — Other Ambulatory Visit: Payer: Self-pay | Admitting: Neurology

## 2017-10-07 NOTE — Telephone Encounter (Signed)
Rx registry checked. Last fill date 09/11/17 for #60. Last OV was 11/02/16 and next OV is 11/03/17.

## 2017-10-08 NOTE — Telephone Encounter (Signed)
Rx sent electronically.  

## 2017-10-12 ENCOUNTER — Telehealth: Payer: Self-pay | Admitting: Neurology

## 2017-10-12 DIAGNOSIS — M25511 Pain in right shoulder: Principal | ICD-10-CM

## 2017-10-12 DIAGNOSIS — G8929 Other chronic pain: Secondary | ICD-10-CM

## 2017-10-12 NOTE — Telephone Encounter (Signed)
Pt said port a cath hurts all the time now, he is ready to have it taken out. Please call to advise

## 2017-10-12 NOTE — Telephone Encounter (Signed)
I called the patient.  The patient will be sent for removal of the Port-A-Cath.  He is having some discomfort associated with this area.

## 2017-10-13 ENCOUNTER — Other Ambulatory Visit: Payer: Self-pay | Admitting: Neurology

## 2017-10-13 NOTE — Telephone Encounter (Signed)
Rx registry checked. Last fill date is 09/13/17 for #30. Last OV was 11/02/16 and next OV is 11/03/17.

## 2017-10-14 ENCOUNTER — Encounter: Payer: Self-pay | Admitting: Neurology

## 2017-10-18 NOTE — Telephone Encounter (Signed)
Rx sent electronically.  

## 2017-10-29 ENCOUNTER — Ambulatory Visit: Payer: Self-pay | Admitting: Surgery

## 2017-10-29 DIAGNOSIS — Z95828 Presence of other vascular implants and grafts: Secondary | ICD-10-CM | POA: Diagnosis not present

## 2017-10-29 NOTE — H&P (View-Only) (Signed)
Surgical H&P  CC: port   HPI: 57 year old man with history of Rita Ohara myasthenic syndrome (followed at [facility]), obesity, diabetes, depression, height parathyroid disease and status post radioactive iodine ablation, hypertension presents wanting Port-A-Cath removed. Recently the porta started to cause him discomfort.  He has pain at the site of the port as well as up into his left neck where the catheter tubing goes into the vein.  If he turns his head the wrong way he gets a spasm type pain.  He has not used the port for several years.  He has had several ports, including left subclavian x 2, right IJ x 2, left IJ... The left IJ is the most recent and was placed by Dr. Gae Gallop in 2004.  Allergies  Allergen Reactions  . Toradol [Ketorolac Tromethamine]   . Vioxx [Rofecoxib]   . Rituximab   . Statins     Statins medications results in myalgias    Past Medical History:  Diagnosis Date  . Anomalous coronary artery origin    The patient underwent cardiac catheterization in 2004.  Coronaries revealed no stenosis.  However he had aberrant takeoff of both the right and left coronaries from the anterior aortic cusp.  There was no evidence of renal artery stenosis  . Back pain   . Broken fibula    right  . Depression   . Diabetes mellitus without complication ([place])   . Dyslipidemia   . Dyslipidemia   . Ejection fraction    EF 60-65%, echo, March 18, 2011  . H/O radioactive iodine thyroid ablation   . Hypertension   . Hyperthyroidism    November, 201 to  . Lambert-Eaton myasthenic syndrome ([place])    Followed at [facility]. and Dr. Floyde Parkins  . Murmur    Systolic murmur, December, 2012,No valvular abnormalities, echo, December, 2012  . Obesity   . Pre-syncope    February 23, 2011  . QT interval    Question prolongation QT interval, October, 2012, review of EKG dated February 04, 2011, QT interval was not prolonged    Past Surgical History:  Procedure Laterality Date  . KNEE  SURGERY Right   . PORTACATH PLACEMENT    . TONSILLECTOMY      Family History  Problem Relation Age of Onset  . Hypertension Mother   . Cirrhosis Father   . Cirrhosis Maternal Grandmother        non alcoholic  . Heart disease Unknown   . Stroke Unknown   . Cancer Unknown     Social History   Socioeconomic History  . Marital status: Married    Spouse name: Caren Griffins  . Number of children: 2  . Years of education: 57  . Highest education level: Not on file  Occupational History  . Occupation: Print production planner: [employer]  . Financial resource strain: Not on file  . Food insecurity:    Worry: Not on file    Inability: Not on file  . Transportation needs:    Medical: Not on file    Non-medical: Not on file  Tobacco Use  . Smoking status: Never Smoker  . Smokeless tobacco: Never Used  Substance and Sexual Activity  . Alcohol use: No    Alcohol/week: 0.0 oz  . Drug use: No  . Sexual activity: Yes  Lifestyle  . Physical activity:    Days per week: Not on file    Minutes per session: Not on file  .  Stress: Not on file  Relationships  . Social connections:    Talks on phone: Not on file    Gets together: Not on file    Attends religious service: Not on file    Active member of club or organization: Not on file    Attends meetings of clubs or organizations: Not on file    Relationship status: Not on file  Other Topics Concern  . Not on file  Social History Narrative   Patient lives at home with his wife (Cynthia).   Disabled.   Education high school.   Right handed.   Caffeine None .    Current Outpatient Medications on File Prior to Visit  Medication Sig Dispense Refill  . ALPRAZolam (XANAX) 0.5 MG tablet TAKE  (1)  TABLET  THREE TIMES DAILY AS NEEDED. 90 tablet 3  . Amifampridine Phosphate (FIRDAPSE PO) Take by mouth.    . Diaminopyridine POWD MEDICATION IS 10MG TABLETS....IT IS NOT POWDER....TAKES 3 TABLETS  IN THE  MORNING FOR A 30MG DOSAGE, 2 TABLETS MIDDAY FOR A  20MG DOSAGE  AND 3 TABLETS AT NIGHT FOR A 30MG DOSAGE.    . DULoxetine (CYMBALTA) 20 MG capsule TAKE 1 CAPSULE (20 MG TOTAL) BY MOUTH AT BEDTIME. 90 capsule 3  . fish oil-omega-3 fatty acids 1000 MG capsule Take 1 g by mouth 3 (three) times daily.      . glucose blood (ONE TOUCH ULTRA TEST) test strip 1 each by Other route 3 (three) times daily. 100 each 3  . HYDROcodone-acetaminophen (NORCO/VICODIN) 5-325 MG tablet TAKE (1) TABLET EVERY SIX HOURS AS NEEDED. MUST LAST 28 DAYS 60 tablet 0  . HYDROcodone-acetaminophen (NORCO/VICODIN) 5-325 MG tablet TAKE (1) TABLET EVERY SIX HOURS AS NEEDED. MUST LAST 28 DAYS 60 tablet 0  . levothyroxine (SYNTHROID, LEVOTHROID) 125 MCG tablet   5  . lisinopril (PRINIVIL,ZESTRIL) 5 MG tablet TAKE 1 TABLET (5 MG TOTAL) BY MOUTH DAILY. 30 tablet 0  . Multiple Vitamins-Minerals (ONE-A-DAY MENS 50+ ADVANTAGE PO) Take 1 tablet by mouth daily.      . pyridostigmine (MESTINON) 60 MG tablet TAKE  (1)  TABLET  THREE TIMES DAILY. 90 tablet 1  . zolpidem (AMBIEN) 10 MG tablet Take 1 tablet (10 mg total) by mouth at bedtime as needed for sleep. 30 tablet 1   No current facility-administered medications on file prior to visit.     Review of Systems: a complete, 10pt review of systems was completed with pertinent positives and negatives as documented in the HPI  Physical Exam: There were no vitals filed for this visit. Gen: A&Ox3, no distress  Head: normocephalic, atraumatic Eyes: extraocular motions intact, anicteric.  Neck: supple without mass or thyromegaly Chest: unlabored respirations, symmetrical air entry, clear bilaterally . Port-A-Cath present in the left chest with tubing palpable to the neck. Cardiovascular: RRR with palpable distal pulses, no pedal edema Abdomen: soft, nondistended, nontender. No mass or organomegaly.  Extremities: warm, without edema, no deformities. On his left forearm there is a tender  dilated superficial veins.  He states that he had a IV for CT contrast at some point and it has bothered him ever since then.  Somewhat relieved by wearing a compression garment. Neuro: grossly intact Psych: appropriate mood and affect, normal insight  Skin: warm and dry   CBC Latest Ref Rng & Units 12/16/2012 02/23/2011 03/16/2007  WBC 4.6 - 10.2 K/uL 7.3 9.6 6.7  Hemoglobin 14.1 - 18.1 g/dL 15.9 14.7 14.9  Hematocrit 43.5 -   53.7 % 48.7 41.3 42.6  Platelets 150 - 400 K/uL - 157 182    CMP Latest Ref Rng & Units 02/28/2013 12/15/2012 02/23/2011  Glucose 65 - 99 mg/dL 182(H) 100(H) 137(H)  BUN 6 - 24 mg/dL _0 Creatinine 0.76 - 1.27 mg/dL 1.02 0.95 0.70  Sodium 134 - 144 mmol/L 137 141 138  Potassium 3.5 - 5.2 mmol/L 4.1 4.6 4.2  Chloride 97 - 108 mmol/L 94(L) 97 103  CO2 18 - 29 mmol/L _1 Calcium 8.7 - 10.2 mg/dL 9.3 9.6 9.9  Total Protein 6.0 - 8.5 g/dL 6.9 7.7 -  Total Bilirubin 0.0 - 1.2 mg/dL 0.5 0.5 -  Alkaline Phos 39 - 117 IU/L 77 64 -  AST 0 - 40 IU/L 55(H) 75(H) -  ALT 0 - 44 IU/L 45(H) 62(H) -    No results found for: INR, PROTIME  Imaging: No results found.   A/P: Port a cath in place. Causing pain and desires removal. Has not used for several years. Interestingly it has been in place for 15 years now and has only recently started to cause him discomfort. We discussed removal under MAC. Risk of bleeding, infection, ongoing pain, scarring, failure to resolve symptoms. Also discussed the very very unlikely possibility that the tubing breaks and he can require another procedure to retrieve it. The swollen vein in his left forearm I suspect is a little bit of superficial thrombophlebitis and I recommended ongoing compression, ice and NSAIDs. It is also possible that he has some central stenosis from chronic Port-A-Cath presence and this is contributing.   Romana Juniper, MD [facility] Surgery, [place] Pager [phone]

## 2017-10-29 NOTE — H&P (Signed)
Surgical H&P  CC: port   HPI: 57 year old man with history of Rita Ohara myasthenic syndrome (followed at [facility]), obesity, diabetes, depression, height parathyroid disease and status post radioactive iodine ablation, hypertension presents wanting Port-A-Cath removed. Recently the porta started to cause him discomfort.  He has pain at the site of the port as well as up into his left neck where the catheter tubing goes into the vein.  If he turns his head the wrong way he gets a spasm type pain.  He has not used the port for several years.  He has had several ports, including left subclavian x 2, right IJ x 2, left IJ... The left IJ is the most recent and was placed by Dr. Gae Gallop in 2004.  Allergies  Allergen Reactions  . Toradol [Ketorolac Tromethamine]   . Vioxx [Rofecoxib]   . Rituximab   . Statins     Statins medications results in myalgias    Past Medical History:  Diagnosis Date  . Anomalous coronary artery origin    The patient underwent cardiac catheterization in 2004.  Coronaries revealed no stenosis.  However he had aberrant takeoff of both the right and left coronaries from the anterior aortic cusp.  There was no evidence of renal artery stenosis  . Back pain   . Broken fibula    right  . Depression   . Diabetes mellitus without complication ([place])   . Dyslipidemia   . Dyslipidemia   . Ejection fraction    EF 60-65%, echo, March 18, 2011  . H/O radioactive iodine thyroid ablation   . Hypertension   . Hyperthyroidism    November, 201 to  . Lambert-Eaton myasthenic syndrome ([place])    Followed at [facility]. and Dr. Floyde Parkins  . Murmur    Systolic murmur, December, 2012,No valvular abnormalities, echo, December, 2012  . Obesity   . Pre-syncope    February 23, 2011  . QT interval    Question prolongation QT interval, October, 2012, review of EKG dated February 04, 2011, QT interval was not prolonged    Past Surgical History:  Procedure Laterality Date  . KNEE  SURGERY Right   . PORTACATH PLACEMENT    . TONSILLECTOMY      Family History  Problem Relation Age of Onset  . Hypertension Mother   . Cirrhosis Father   . Cirrhosis Maternal Grandmother        non alcoholic  . Heart disease Unknown   . Stroke Unknown   . Cancer Unknown     Social History   Socioeconomic History  . Marital status: Married    Spouse name: Caren Griffins  . Number of children: 2  . Years of education: 57  . Highest education level: Not on file  Occupational History  . Occupation: Print production planner: [employer]  . Financial resource strain: Not on file  . Food insecurity:    Worry: Not on file    Inability: Not on file  . Transportation needs:    Medical: Not on file    Non-medical: Not on file  Tobacco Use  . Smoking status: Never Smoker  . Smokeless tobacco: Never Used  Substance and Sexual Activity  . Alcohol use: No    Alcohol/week: 0.0 oz  . Drug use: No  . Sexual activity: Yes  Lifestyle  . Physical activity:    Days per week: Not on file    Minutes per session: Not on file  .  Stress: Not on file  Relationships  . Social connections:    Talks on phone: Not on file    Gets together: Not on file    Attends religious service: Not on file    Active member of club or organization: Not on file    Attends meetings of clubs or organizations: Not on file    Relationship status: Not on file  Other Topics Concern  . Not on file  Social History Narrative   Patient lives at home with his wife Caren Griffins).   Disabled.   Education high school.   Right handed.   Caffeine None .    Current Outpatient Medications on File Prior to Visit  Medication Sig Dispense Refill  . ALPRAZolam (XANAX) 0.5 MG tablet TAKE  (1)  TABLET  THREE TIMES DAILY AS NEEDED. 90 tablet 3  . Amifampridine Phosphate (FIRDAPSE PO) Take by mouth.    . Diaminopyridine POWD MEDICATION IS 10MG TABLETS....IT IS NOT POWDER....TAKES 3 TABLETS  IN THE  MORNING FOR A 30MG DOSAGE, 2 TABLETS MIDDAY FOR A  20MG DOSAGE  AND 3 TABLETS AT NIGHT FOR A 30MG DOSAGE.    . DULoxetine (CYMBALTA) 20 MG capsule TAKE 1 CAPSULE (20 MG TOTAL) BY MOUTH AT BEDTIME. 90 capsule 3  . fish oil-omega-3 fatty acids 1000 MG capsule Take 1 g by mouth 3 (three) times daily.      Marland Kitchen glucose blood (ONE TOUCH ULTRA TEST) test strip 1 each by Other route 3 (three) times daily. 100 each 3  . HYDROcodone-acetaminophen (NORCO/VICODIN) 5-325 MG tablet TAKE (1) TABLET EVERY SIX HOURS AS NEEDED. MUST LAST 28 DAYS 60 tablet 0  . HYDROcodone-acetaminophen (NORCO/VICODIN) 5-325 MG tablet TAKE (1) TABLET EVERY SIX HOURS AS NEEDED. MUST LAST 28 DAYS 60 tablet 0  . levothyroxine (SYNTHROID, LEVOTHROID) 125 MCG tablet   5  . lisinopril (PRINIVIL,ZESTRIL) 5 MG tablet TAKE 1 TABLET (5 MG TOTAL) BY MOUTH DAILY. 30 tablet 0  . Multiple Vitamins-Minerals (ONE-A-DAY MENS 50+ ADVANTAGE PO) Take 1 tablet by mouth daily.      Marland Kitchen pyridostigmine (MESTINON) 60 MG tablet TAKE  (1)  TABLET  THREE TIMES DAILY. 90 tablet 1  . zolpidem (AMBIEN) 10 MG tablet Take 1 tablet (10 mg total) by mouth at bedtime as needed for sleep. 30 tablet 1   No current facility-administered medications on file prior to visit.     Review of Systems: a complete, 10pt review of systems was completed with pertinent positives and negatives as documented in the HPI  Physical Exam: There were no vitals filed for this visit. Gen: A&Ox3, no distress  Head: normocephalic, atraumatic Eyes: extraocular motions intact, anicteric.  Neck: supple without mass or thyromegaly Chest: unlabored respirations, symmetrical air entry, clear bilaterally . Port-A-Cath present in the left chest with tubing palpable to the neck. Cardiovascular: RRR with palpable distal pulses, no pedal edema Abdomen: soft, nondistended, nontender. No mass or organomegaly.  Extremities: warm, without edema, no deformities. On his left forearm there is a tender  dilated superficial veins.  He states that he had a IV for CT contrast at some point and it has bothered him ever since then.  Somewhat relieved by wearing a compression garment. Neuro: grossly intact Psych: appropriate mood and affect, normal insight  Skin: warm and dry   CBC Latest Ref Rng & Units 12/16/2012 02/23/2011 03/16/2007  WBC 4.6 - 10.2 K/uL 7.3 9.6 6.7  Hemoglobin 14.1 - 18.1 g/dL 15.9 14.7 14.9  Hematocrit 43.5 -  53.7 % 48.7 41.3 42.6  Platelets 150 - 400 K/uL - 157 182    CMP Latest Ref Rng & Units 02/28/2013 12/15/2012 02/23/2011  Glucose 65 - 99 mg/dL 182(H) 100(H) 137(H)  BUN 6 - 24 mg/dL _0 Creatinine 0.76 - 1.27 mg/dL 1.02 0.95 0.70  Sodium 134 - 144 mmol/L 137 141 138  Potassium 3.5 - 5.2 mmol/L 4.1 4.6 4.2  Chloride 97 - 108 mmol/L 94(L) 97 103  CO2 18 - 29 mmol/L _1 Calcium 8.7 - 10.2 mg/dL 9.3 9.6 9.9  Total Protein 6.0 - 8.5 g/dL 6.9 7.7 -  Total Bilirubin 0.0 - 1.2 mg/dL 0.5 0.5 -  Alkaline Phos 39 - 117 IU/L 77 64 -  AST 0 - 40 IU/L 55(H) 75(H) -  ALT 0 - 44 IU/L 45(H) 62(H) -    No results found for: INR, PROTIME  Imaging: No results found.   A/P: Port a cath in place. Causing pain and desires removal. Has not used for several years. Interestingly it has been in place for 15 years now and has only recently started to cause him discomfort. We discussed removal under MAC. Risk of bleeding, infection, ongoing pain, scarring, failure to resolve symptoms. Also discussed the very very unlikely possibility that the tubing breaks and he can require another procedure to retrieve it. The swollen vein in his left forearm I suspect is a little bit of superficial thrombophlebitis and I recommended ongoing compression, ice and NSAIDs. It is also possible that he has some central stenosis from chronic Port-A-Cath presence and this is contributing.   Romana Juniper, MD [facility] Surgery, [place] Pager [phone]

## 2017-11-03 ENCOUNTER — Ambulatory Visit (INDEPENDENT_AMBULATORY_CARE_PROVIDER_SITE_OTHER): Payer: Medicare Other | Admitting: Neurology

## 2017-11-03 ENCOUNTER — Encounter: Payer: Self-pay | Admitting: Neurology

## 2017-11-03 VITALS — BP 128/70 | HR 59 | Ht 70.0 in | Wt 214.0 lb

## 2017-11-03 DIAGNOSIS — Z79891 Long term (current) use of opiate analgesic: Secondary | ICD-10-CM

## 2017-11-03 DIAGNOSIS — G7080 Lambert-Eaton syndrome, unspecified: Secondary | ICD-10-CM | POA: Diagnosis not present

## 2017-11-03 DIAGNOSIS — E890 Postprocedural hypothyroidism: Secondary | ICD-10-CM | POA: Diagnosis not present

## 2017-11-03 MED ORDER — HYDROCODONE-ACETAMINOPHEN 5-325 MG PO TABS
ORAL_TABLET | ORAL | 0 refills | Status: DC
Start: 2017-11-03 — End: 2017-11-29

## 2017-11-03 NOTE — Progress Notes (Signed)
Reason for visit: [name] is an 57 y.o. male  History of present illness:  [name] is a 57 year old right-handed white male with a history of [name] and diabetes.  The patient had a recent exacerbation with increased fatigue and muscle pain associated with hypothyroidism.  He has improved with adequate treatment of this issue.  The patient currently is on Firdapse (amifampridine) through [facility], he is no longer on 2,4 DAP.  The patient has had an increase in the Mestinon dosing, he takes 60 mg, 1 tab in the morning, half at midday, 1 in the evening.  He does have some increased muscle cramps associated with this.  The patient overall is doing fairly well.  He works part-time as an Stage manager.  He takes hydrocodone for pain, averaging 2 tablets daily.  He is motivated to try to come off the medication.  The patient returns for an evaluation.  Past Medical History:  Diagnosis Date  . Anomalous coronary artery origin    The patient underwent cardiac catheterization in 2004.  Coronaries revealed no stenosis.  However he had aberrant takeoff of both the right and left coronaries from the anterior aortic cusp.  There was no evidence of renal artery stenosis  . Back pain   . Broken fibula    right  . Depression   . Diabetes mellitus without complication ([place])   . Dyslipidemia   . Dyslipidemia   . Ejection fraction    EF 60-65%, echo, March 18, 2011  . H/O radioactive iodine thyroid ablation   . Hypertension   . Hyperthyroidism    November, 201 to  . 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 and Dr. Floyde Parkins  . Murmur    Systolic murmur, December, 2012,No valvular abnormalities, echo, December, 2012  . Obesity   . Pre-syncope    February 23, 2011  . QT interval    Question prolongation QT interval, October, 2012, review of EKG dated February 04, 2011, QT interval was not prolonged    Past  Surgical History:  Procedure Laterality Date  . KNEE SURGERY Right   . PORTACATH PLACEMENT    . TONSILLECTOMY      Family History  Problem Relation Age of Onset  . Hypertension Mother   . Cirrhosis Father   . Cirrhosis Maternal Grandmother        non alcoholic  . Heart disease Unknown   . Stroke Unknown   . Cancer Unknown     Social history:  reports that he has never smoked. He has never used smokeless tobacco. He reports that he does not drink alcohol or use drugs.    Allergies  Allergen Reactions  . Toradol [Ketorolac Tromethamine]   . Vioxx [Rofecoxib]   . Rituximab   . Statins     Statins medications results in myalgias    Medications:  Prior to Admission medications   Medication Sig Start Date End Date Taking? Authorizing Provider  ALPRAZolam Duanne Moron) 0.5 MG tablet TAKE  (1)  TABLET  THREE TIMES DAILY AS NEEDED. 10/01/17  Yes Kathrynn Ducking, MD  Amifampridine Phosphate (FIRDAPSE PO) Take by mouth.   Yes [provider]  DULoxetine (CYMBALTA) 20 MG capsule TAKE 1 CAPSULE (20 MG TOTAL) BY MOUTH AT BEDTIME. 11/03/16  Yes Kathrynn Ducking, MD  FIRDAPSE 10 MG TABS Take 10 mg by mouth. 11/01/17  Yes [provider]  fish oil-omega-3 fatty acids  1000 MG capsule Take 1 g by mouth 3 (three) times daily.     Yes [provider]  glucose blood (ONE TOUCH ULTRA TEST) test strip 1 each by Other route 3 (three) times daily. 02/28/13  Yes Deneise Lever, MD  HYDROcodone-acetaminophen (NORCO/VICODIN) 5-325 MG tablet TAKE (1) TABLET EVERY SIX HOURS AS NEEDED. MUST LAST 28 DAYS 09/09/17  Yes Kathrynn Ducking, MD  HYDROcodone-acetaminophen (NORCO/VICODIN) 5-325 MG tablet TAKE (1) TABLET EVERY SIX HOURS AS NEEDED. MUST LAST 28 DAYS 10/07/17  Yes Kathrynn Ducking, MD  levothyroxine (SYNTHROID, LEVOTHROID) 125 MCG tablet  10/29/15  Yes [provider]  lisinopril (PRINIVIL,ZESTRIL) 5 MG tablet TAKE 1 TABLET (5 MG TOTAL) BY MOUTH DAILY.   Yes Lysbeth Penner, FNP  Multiple Vitamins-Minerals (ONE-A-DAY MENS 50+ ADVANTAGE PO) Take 1 tablet by mouth daily.     Yes [provider]  pyridostigmine (MESTINON) 60 MG tablet TAKE  (1)  TABLET  THREE TIMES DAILY. 08/12/17  Yes Kathrynn Ducking, MD  zolpidem (AMBIEN) 10 MG tablet Take 1 tablet (10 mg total) by mouth at bedtime as needed for sleep. 10/13/17 11/12/17 Yes Kathrynn Ducking, MD    ROS:  Out of a complete 14 system review of symptoms, the patient complains only of the following symptoms, and all other reviewed systems are negative.  Muscle cramps Fatigue  Blood pressure 128/70, pulse (!) 59, height 5\' 10"  (1.778 m), weight 214 lb (97.1 kg), SpO2 98 %.  Physical Exam  General: The patient is alert and cooperative at the time of the examination.  The patient is moderately obese.  Skin: No significant peripheral edema is noted.   Neurologic Exam  Mental status: The patient is alert and oriented x 3 at the time of the examination. The patient has apparent normal recent and remote memory, with an apparently normal attention span and concentration ability.   Cranial nerves: Facial symmetry is present. Speech is normal, no aphasia or dysarthria is noted. Extraocular movements are full. Visual fields are full.  Motor: The patient has good strength in all 4 extremities.  Sensory examination: Soft touch sensation is symmetric on the face, arms, and legs.  Coordination: The patient has good finger-nose-finger and heel-to-shin bilaterally.  Gait and station: The patient has a normal gait. Tandem gait is normal. Romberg is negative. No drift is seen.  Reflexes: Deep tendon reflexes are symmetric.   Assessment/Plan:  1.  [name]  The patient is doing relatively well at this point.  A prescription for the hydrocodone will be sent in.  He will try to cut back on the dosing taking 1/2 tablet 3 times a day for 6 weeks, then go to half a tablet twice a day for 6  weeks, and then to a 1/2 tablet daily dose and then stop.  The patient will have a urine drug screen today.  He will follow-up in 1 year.   Jill Alexanders MD 11/03/2017 8:54 AM  [facility] [address] [place], [place] [address]  Phone [phone] Fax [phone]

## 2017-11-05 ENCOUNTER — Other Ambulatory Visit: Payer: Self-pay | Admitting: Neurology

## 2017-11-08 LAB — COMPLIANCE DRUG ANALYSIS, UR

## 2017-11-09 ENCOUNTER — Encounter (HOSPITAL_COMMUNITY): Payer: Self-pay

## 2017-11-09 ENCOUNTER — Other Ambulatory Visit (HOSPITAL_COMMUNITY): Payer: BLUE CROSS/BLUE SHIELD

## 2017-11-09 NOTE — Pre-Procedure Instructions (Addendum)
[name]  11/09/2017      [facility] [place] [place] [address] Phone: [phone] Fax: [phone]    Your procedure is scheduled on Aug. 2  Report to [facility] at 9:00 A.M.  Call this number if you have problems the morning of surgery:  [phone]   Remember:   Do not eat or drink after midnight.    Take these medicines the morning of surgery with A SIP OF WATER :              Alprazolam (xanax)            firdapse            Hydrocodone if needed            Levothyroxine (synthroid)                        7 days prior to surgery STOP taking any Aspirin(unless otherwise instructed by your surgeon), Aleve, Naproxen, Ibuprofen, Motrin, Advil, Goody's, BC's, all herbal medications, fish oil, and all vitamins      How to Manage Your Diabetes Before and After Surgery  Why is it important to control my blood sugar before and after surgery? . Improving blood sugar levels before and after surgery helps healing and can limit problems. . A way of improving blood sugar control is eating a healthy diet by: o  Eating less sugar and carbohydrates o  Increasing activity/exercise o  Talking with your doctor about reaching your blood sugar goals . High blood sugars (greater than 180 mg/dL) can raise your risk of infections and slow your recovery, so you will need to focus on controlling your diabetes during the weeks before surgery. . Make sure that the doctor who takes care of your diabetes knows about your planned surgery including the date and location.  How do I manage my blood sugar before surgery? . Check your blood sugar at least 4 times a day, starting 2 days before surgery, to make sure that the level is not too high or low. o Check your blood sugar the morning of your surgery when you wake up and every 2 hours until you get to the Short Stay unit. . If your blood sugar is less  than 70 mg/dL, you will need to treat for low blood sugar: o Do not take insulin. o Treat a low blood sugar (less than 70 mg/dL) with  cup of clear juice (cranberry or apple), 4 glucose tablets, OR glucose gel. Recheck blood sugar in 15 minutes after treatment (to make sure it is greater than 70 mg/dL). If your blood sugar is not greater than 70 mg/dL on recheck, call [phone] o  for further instructions. . Report your blood sugar to the short stay nurse when you get to Short Stay.  . If you are admitted to the hospital after surgery: o Your blood sugar will be checked by the staff and you will probably be given insulin after surgery (instead of oral diabetes medicines) to make sure you have good blood sugar levels. o The goal for blood sugar control after surgery is 80-180 mg/dL.       Do not wear jewelry, make-up or nail polish.  Do not wear lotions, powders, or perfumes, or deodorant.  Do not shave 48 hours prior to surgery.  Men may shave face and neck.  Do not bring valuables to the hospital.  [facility] is not responsible for any belongings or valuables.  Contacts, dentures or bridgework may not be worn into surgery.  Leave your suitcase in the car.  After surgery it may be brought to your room.  For patients admitted to the hospital, discharge time will be determined by your treatment team.  Patients discharged the day of surgery will not be allowed to drive home.    Special instructions:   [facility]- Preparing For Surgery  Before surgery, you can play an important role. Because skin is not sterile, your skin needs to be as free of germs as possible. You can reduce the number of germs on your skin by washing with CHG (chlorahexidine gluconate) Soap before surgery.  CHG is an antiseptic cleaner which kills germs and bonds with the skin to continue killing germs even after washing.    Oral Hygiene is also important to reduce your risk of infection.  Remember - BRUSH YOUR  TEETH THE MORNING OF SURGERY WITH YOUR REGULAR TOOTHPASTE  Please do not use if you have an allergy to CHG or antibacterial soaps. If your skin becomes reddened/irritated stop using the CHG.  Do not shave (including legs and underarms) for at least 48 hours prior to first CHG shower. It is OK to shave your face.  Please follow these instructions carefully.   1. Shower the NIGHT BEFORE SURGERY and the MORNING OF SURGERY with CHG.   2. If you chose to wash your hair, wash your hair first as usual with your normal shampoo.  3. After you shampoo, rinse your hair and body thoroughly to remove the shampoo.  4. Use CHG as you would any other liquid soap. You can apply CHG directly to the skin and wash gently with a scrungie or a clean washcloth.   5. Apply the CHG Soap to your body ONLY FROM THE NECK DOWN.  Do not use on open wounds or open sores. Avoid contact with your eyes, ears, mouth and genitals (private parts). Wash Face and genitals (private parts)  with your normal soap.  6. Wash thoroughly, paying special attention to the area where your surgery will be performed.  7. Thoroughly rinse your body with warm water from the neck down.  8. DO NOT shower/wash with your normal soap after using and rinsing off the CHG Soap.  9. Pat yourself dry with a CLEAN TOWEL.  10. Wear CLEAN PAJAMAS to bed the night before surgery, wear comfortable clothes the morning of surgery  11. Place CLEAN SHEETS on your bed the night of your first shower and DO NOT SLEEP WITH PETS.    Day of Surgery:  Do not apply any deodorants/lotions.  Please wear clean clothes to the hospital/surgery center.   Remember to brush your teeth WITH YOUR REGULAR TOOTHPASTE.    Please read over the following fact sheets that you were given. Coughing and Deep Breathing and Surgical Site Infection Prevention

## 2017-11-10 ENCOUNTER — Encounter (HOSPITAL_COMMUNITY)
Admission: RE | Admit: 2017-11-10 | Discharge: 2017-11-10 | Disposition: A | Discharge status: Home / Self Care | Payer: Medicare Other | Source: Ambulatory Visit | Attending: Surgery | Admitting: Surgery

## 2017-11-10 ENCOUNTER — Other Ambulatory Visit: Payer: Self-pay

## 2017-11-10 ENCOUNTER — Encounter (HOSPITAL_COMMUNITY): Payer: Self-pay

## 2017-11-10 DIAGNOSIS — I10 Essential (primary) hypertension: Secondary | ICD-10-CM | POA: Diagnosis not present

## 2017-11-10 DIAGNOSIS — F329 Major depressive disorder, single episode, unspecified: Secondary | ICD-10-CM | POA: Diagnosis not present

## 2017-11-10 DIAGNOSIS — Z452 Encounter for adjustment and management of vascular access device: Secondary | ICD-10-CM | POA: Diagnosis not present

## 2017-11-10 DIAGNOSIS — Z79899 Other long term (current) drug therapy: Secondary | ICD-10-CM | POA: Diagnosis not present

## 2017-11-10 DIAGNOSIS — E119 Type 2 diabetes mellitus without complications: Secondary | ICD-10-CM | POA: Diagnosis not present

## 2017-11-10 DIAGNOSIS — E785 Hyperlipidemia, unspecified: Secondary | ICD-10-CM | POA: Diagnosis not present

## 2017-11-10 DIAGNOSIS — G7080 Lambert-Eaton syndrome, unspecified: Secondary | ICD-10-CM | POA: Diagnosis not present

## 2017-11-10 HISTORY — DX: Lambert-Eaton syndrome, unspecified: G70.80

## 2017-11-10 HISTORY — DX: Unspecified osteoarthritis, unspecified site: M19.90

## 2017-11-10 HISTORY — DX: Personal history of other medical treatment: Z92.89

## 2017-11-10 LAB — CBC
HEMATOCRIT: 44.9 % (ref 39.0–52.0)
Hemoglobin: 15.3 g/dL (ref 13.0–17.0)
MCH: 30.5 pg (ref 26.0–34.0)
MCHC: 34.1 g/dL (ref 30.0–36.0)
MCV: 89.4 fL (ref 78.0–100.0)
Platelets: 152 10*3/uL (ref 150–400)
RBC: 5.02 MIL/uL (ref 4.22–5.81)
RDW: 12.7 % (ref 11.5–15.5)
WBC: 5.9 10*3/uL (ref 4.0–10.5)

## 2017-11-10 LAB — BASIC METABOLIC PANEL
Anion gap: 9 (ref 5–15)
BUN: 11 mg/dL (ref 6–20)
CALCIUM: 9.5 mg/dL (ref 8.9–10.3)
CO2: 27 mmol/L (ref 22–32)
CREATININE: 0.94 mg/dL (ref 0.61–1.24)
Chloride: 104 mmol/L (ref 98–111)
GFR calc non Af Amer: >60 mL/min (ref >60)
Glucose, Bld: 91 mg/dL (ref 70–99)
Potassium: 3.8 mmol/L (ref 3.5–5.1)
Sodium: 140 mmol/L (ref 135–145)

## 2017-11-10 LAB — GLUCOSE, CAPILLARY: GLUCOSE-CAPILLARY: 123 mg/dL — AB (ref 70–99)

## 2017-11-10 NOTE — Progress Notes (Signed)
PCP: [facility]--Dr. Laurance Flatten  No cardiologist  Neurology: Dr. Jannifer Franklin                   Dr. Fox-Duke                   Dr. Glenda Chroman  Endocrinologist: Dr.Bindi Balan  Fasting sugars   100

## 2017-11-11 NOTE — Progress Notes (Signed)
Anesthesia Chart Review:  Case:  [id number] Date/Time:  11/12/17 1045   Procedure:  REMOVAL PORT-A-CATH (N/A )   Anesthesia type:  Monitor Anesthesia Care   Pre-op diagnosis:  PORT   Location:  [facility] OR ROOM 08 / [place] OR   Surgeon:  Clovis Riley, MD      DISCUSSION: 57 yo male never smoker for above procedure. Pertinent hx includes Depression, Lambert-Eaton myasthenic syndrome, HTN, hypothyroid, DMII.  Pt follows at [facility] and with Dr. Jannifer Franklin for Shawna Clamp syndrome. Last saw Dr. Jannifer Franklin 11/03/2017 and per his note the pt was doing relatively well at that time and was advised to f/u in 1 year. He is aware of upcoming surgery.  Anticipate he can proceed as scheduled barring acute status change.  VS: BP (!) 121/46   Pulse (!) 58   Temp 36.7 C   Resp 20   Ht 5\' 10"  (1.778 m)   Wt 216 lb 4.8 oz (98.1 kg)   SpO2 97%   BMI 31.04 kg/m   PROVIDERS: Patient, No Pcp Per   Jacelyn Pi, MD is Endocrinologist last seen 05/24/2017  Margette Fast, MD and Queen Slough, MD provide Neurology care. Last seen 11/03/2017.  LABS: Labs reviewed: Acceptable for surgery. (all labs ordered are listed, but only abnormal results are displayed)  Labs Reviewed  GLUCOSE, CAPILLARY - Abnormal; Notable for the following components:      Result Value   Glucose-Capillary 123 (*)    All other components within normal limits  BASIC METABOLIC PANEL  CBC     IMAGES: CHEST - 2 VIEW 02/23/2011  Comparison: Chest CTA 06/27/2005 and earlier.  Findings: Stable left chest Port-A-Cath.  Stable lung volumes. Cardiac size and mediastinal contours are within normal limits. Visualized tracheal air column is within normal limits.  No pneumothorax, pulmonary edema, pleural effusion or confluent pulmonary opacity.  Mild loss of height in mid thoracic vertebral bodies appears stable. No acute osseous abnormality identified.  IMPRESSION: No acute cardiopulmonary abnormality.   EKG: 11/10/2017: Sinus  bradycardia. Early repolarization. When compared with ECG of 02/22/18 the rate is slower  CV: 03/18/2011: Study Conclusions  - Left ventricle: The cavity size was normal. Wall thickness was increased in a pattern of mild LVH. Systolic function was normal. The estimated ejection fraction was in the range of 60% to 65%. Wall motion was normal; there were no regional wall motion abnormalities. Left ventricular diastolic function parameters were normal. - Left atrium: The atrium was mildly to moderately dilated  Past Medical History:  Diagnosis Date  . Anomalous coronary artery origin    The patient underwent cardiac catheterization in 2004.  Coronaries revealed no stenosis.  However he had aberrant takeoff of both the right and left coronaries from the anterior aortic cusp.  There was no evidence of renal artery stenosis  . Arthritis   . Back pain   . Broken fibula    right  . Depression   . Diabetes mellitus without complication ([place])   . Dyslipidemia   . Dyslipidemia   . Ejection fraction    EF 60-65%, echo, March 18, 2011  . H/O radioactive iodine thyroid ablation   . History of plasmapheresis   . Hypertension   . Hyperthyroidism    November, 201 to  . 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 and Dr. Floyde Parkins  . [name] ([place])   . Murmur    Systolic murmur, December, 2012,No valvular abnormalities, echo, December, 2012  .  Obesity   . Pre-syncope    February 23, 2011  . QT interval    Question prolongation QT interval, October, 2012, review of EKG dated February 04, 2011, QT interval was not prolonged    Past Surgical History:  Procedure Laterality Date  . KNEE SURGERY Right   . PORTACATH PLACEMENT    . TONSILLECTOMY      MEDICATIONS: . ALPRAZolam (XANAX) 0.5 MG tablet  . FIRDAPSE 10 MG TABS  . HYDROcodone-acetaminophen (NORCO/VICODIN) 5-325 MG tablet  . levothyroxine (SYNTHROID, LEVOTHROID) 150 MCG tablet  .  lisinopril (PRINIVIL,ZESTRIL) 5 MG tablet  . Multiple Vitamin (MULTIVITAMIN WITH MINERALS) TABS tablet  . Omega-3 Fatty Acids (FISH OIL) 1200 MG CAPS  . pyridostigmine (MESTINON) 60 MG tablet  . zolpidem (AMBIEN) 10 MG tablet   No current facility-administered medications for this encounter.     Wynonia Musty [facility] Short Stay Center/Anesthesiology Phone [phone] 11/11/2017 8:24 AM

## 2017-11-12 ENCOUNTER — Ambulatory Visit (HOSPITAL_COMMUNITY)
Admission: RE | Admit: 2017-11-12 | Discharge: 2017-11-12 | Disposition: A | Discharge status: Home / Self Care | Payer: Medicare Other | Source: Ambulatory Visit | Attending: Surgery | Admitting: Surgery

## 2017-11-12 ENCOUNTER — Ambulatory Visit (HOSPITAL_COMMUNITY): Payer: Medicare Other

## 2017-11-12 ENCOUNTER — Encounter (HOSPITAL_COMMUNITY): Payer: Self-pay

## 2017-11-12 ENCOUNTER — Ambulatory Visit (HOSPITAL_COMMUNITY): Payer: Medicare Other | Admitting: Physician Assistant

## 2017-11-12 ENCOUNTER — Other Ambulatory Visit: Payer: Self-pay

## 2017-11-12 ENCOUNTER — Encounter (HOSPITAL_COMMUNITY)
Admission: RE | Disposition: A | Discharge status: Home / Self Care | Payer: Self-pay | Source: Ambulatory Visit | Attending: Surgery

## 2017-11-12 ENCOUNTER — Ambulatory Visit (HOSPITAL_COMMUNITY): Payer: Medicare Other | Admitting: Anesthesiology

## 2017-11-12 DIAGNOSIS — Z452 Encounter for adjustment and management of vascular access device: Secondary | ICD-10-CM | POA: Diagnosis not present

## 2017-11-12 DIAGNOSIS — Z9889 Other specified postprocedural states: Secondary | ICD-10-CM

## 2017-11-12 DIAGNOSIS — E119 Type 2 diabetes mellitus without complications: Secondary | ICD-10-CM | POA: Diagnosis not present

## 2017-11-12 DIAGNOSIS — F329 Major depressive disorder, single episode, unspecified: Secondary | ICD-10-CM | POA: Diagnosis not present

## 2017-11-12 DIAGNOSIS — I10 Essential (primary) hypertension: Secondary | ICD-10-CM | POA: Insufficient documentation

## 2017-11-12 DIAGNOSIS — E785 Hyperlipidemia, unspecified: Secondary | ICD-10-CM | POA: Insufficient documentation

## 2017-11-12 DIAGNOSIS — G7080 Lambert-Eaton syndrome, unspecified: Secondary | ICD-10-CM | POA: Insufficient documentation

## 2017-11-12 DIAGNOSIS — Z79899 Other long term (current) drug therapy: Secondary | ICD-10-CM | POA: Diagnosis not present

## 2017-11-12 DIAGNOSIS — E039 Hypothyroidism, unspecified: Secondary | ICD-10-CM | POA: Diagnosis not present

## 2017-11-12 HISTORY — PX: PORT-A-CATH REMOVAL: SHX5289

## 2017-11-12 LAB — GLUCOSE, CAPILLARY
Glucose-Capillary: 105 mg/dL — ABNORMAL HIGH (ref 70–99)
Glucose-Capillary: 95 mg/dL (ref 70–99)

## 2017-11-12 SURGERY — REMOVAL PORT-A-CATH
Anesthesia: General | Site: Chest

## 2017-11-12 MED ORDER — PROPOFOL 10 MG/ML IV BOLUS
INTRAVENOUS | Status: DC | PRN
  Administered 2017-11-12 (×2): 20 mg via INTRAVENOUS
  Administered 2017-11-12: 10 mg via INTRAVENOUS
  Administered 2017-11-12: 100 mg via INTRAVENOUS

## 2017-11-12 MED ORDER — FENTANYL CITRATE (PF) 250 MCG/5ML IJ SOLN
INTRAMUSCULAR | Status: AC
  Filled 2017-11-12: qty 5

## 2017-11-12 MED ORDER — PROPOFOL 10 MG/ML IV BOLUS
INTRAVENOUS | Status: AC
  Filled 2017-11-12: qty 20

## 2017-11-12 MED ORDER — MIDAZOLAM HCL 2 MG/2ML IJ SOLN
INTRAMUSCULAR | Status: AC
  Filled 2017-11-12: qty 2

## 2017-11-12 MED ORDER — FENTANYL CITRATE (PF) 100 MCG/2ML IJ SOLN: 25.0000 ug | INTRAMUSCULAR | Status: DC | PRN

## 2017-11-12 MED ORDER — ONDANSETRON HCL 4 MG/2ML IJ SOLN
INTRAMUSCULAR | Status: DC | PRN
  Administered 2017-11-12: 4 mg via INTRAVENOUS

## 2017-11-12 MED ORDER — CHLORHEXIDINE GLUCONATE 4 % EX LIQD: 60.0000 mL | Freq: Once | CUTANEOUS | Status: DC

## 2017-11-12 MED ORDER — 0.9 % SODIUM CHLORIDE (POUR BTL) OPTIME
TOPICAL | Status: DC | PRN
  Administered 2017-11-12: 1000 mL

## 2017-11-12 MED ORDER — MIDAZOLAM HCL 2 MG/2ML IJ SOLN
INTRAMUSCULAR | Status: DC | PRN
  Administered 2017-11-12: 2 mg via INTRAVENOUS

## 2017-11-12 MED ORDER — SODIUM CHLORIDE 0.9 % IV SOLN: 250.0000 mL | INTRAVENOUS | Status: DC | PRN

## 2017-11-12 MED ORDER — ACETAMINOPHEN 325 MG PO TABS: 650.0000 mg | ORAL_TABLET | ORAL | Status: DC | PRN

## 2017-11-12 MED ORDER — SODIUM CHLORIDE 0.9% FLUSH: 3.0000 mL | INTRAVENOUS | Status: DC | PRN

## 2017-11-12 MED ORDER — ACETAMINOPHEN 650 MG RE SUPP: 650.0000 mg | RECTAL | Status: DC | PRN

## 2017-11-12 MED ORDER — PROPOFOL 500 MG/50ML IV EMUL
INTRAVENOUS | Status: DC | PRN
  Administered 2017-11-12: 50 ug/kg/min via INTRAVENOUS

## 2017-11-12 MED ORDER — LACTATED RINGERS IV SOLN
INTRAVENOUS | Status: DC
  Administered 2017-11-12 (×2): via INTRAVENOUS

## 2017-11-12 MED ORDER — DEXAMETHASONE SODIUM PHOSPHATE 4 MG/ML IJ SOLN: 8.0000 mg | Freq: Once | INTRAMUSCULAR | Status: DC | PRN

## 2017-11-12 MED ORDER — ACETAMINOPHEN 160 MG/5ML PO SOLN: 325.0000 mg | ORAL | Status: DC | PRN

## 2017-11-12 MED ORDER — CEFAZOLIN SODIUM-DEXTROSE 2-4 GM/100ML-% IV SOLN
INTRAVENOUS | Status: AC
  Filled 2017-11-12: qty 100

## 2017-11-12 MED ORDER — OXYCODONE HCL 5 MG PO TABS
5.0000 mg | ORAL_TABLET | ORAL | Status: DC | PRN
  Administered 2017-11-12: 10 mg via ORAL

## 2017-11-12 MED ORDER — OXYCODONE HCL 5 MG PO TABS
5.0000 mg | ORAL_TABLET | Freq: Once | ORAL | Status: AC | PRN
  Administered 2017-11-12: 5 mg via ORAL

## 2017-11-12 MED ORDER — LIDOCAINE HCL (CARDIAC) PF 100 MG/5ML IV SOSY
PREFILLED_SYRINGE | INTRAVENOUS | Status: DC | PRN
  Administered 2017-11-12: 60 mg via INTRAVENOUS

## 2017-11-12 MED ORDER — ACETAMINOPHEN 325 MG PO TABS: 325.0000 mg | ORAL_TABLET | ORAL | Status: DC | PRN

## 2017-11-12 MED ORDER — MEPERIDINE HCL 50 MG/ML IJ SOLN: 6.2500 mg | INTRAMUSCULAR | Status: DC | PRN

## 2017-11-12 MED ORDER — SODIUM CHLORIDE 0.9 % IV SOLN
INTRAVENOUS | Status: AC
  Filled 2017-11-12: qty 1.2

## 2017-11-12 MED ORDER — OXYCODONE HCL 5 MG PO TABS
ORAL_TABLET | ORAL | Status: AC
  Filled 2017-11-12: qty 1

## 2017-11-12 MED ORDER — ONDANSETRON HCL 4 MG/2ML IJ SOLN
INTRAMUSCULAR | Status: AC
  Filled 2017-11-12: qty 2

## 2017-11-12 MED ORDER — LIDOCAINE-EPINEPHRINE (PF) 1 %-1:200000 IJ SOLN
INTRAMUSCULAR | Status: AC
  Filled 2017-11-12: qty 30

## 2017-11-12 MED ORDER — FENTANYL CITRATE (PF) 100 MCG/2ML IJ SOLN
INTRAMUSCULAR | Status: DC | PRN
  Administered 2017-11-12: 50 ug via INTRAVENOUS
  Administered 2017-11-12: 25 ug via INTRAVENOUS

## 2017-11-12 MED ORDER — PROPOFOL 1000 MG/100ML IV EMUL
INTRAVENOUS | Status: AC
  Filled 2017-11-12: qty 100

## 2017-11-12 MED ORDER — OXYCODONE HCL 5 MG PO TABS
ORAL_TABLET | ORAL | Status: AC
  Administered 2017-11-12: 10 mg via ORAL
  Filled 2017-11-12: qty 1

## 2017-11-12 MED ORDER — LIDOCAINE-EPINEPHRINE (PF) 1 %-1:200000 IJ SOLN
INTRAMUSCULAR | Status: DC | PRN
  Administered 2017-11-12: 5 mL

## 2017-11-12 MED ORDER — CEFAZOLIN SODIUM-DEXTROSE 2-4 GM/100ML-% IV SOLN
2.0000 g | INTRAVENOUS | Status: AC
  Administered 2017-11-12: 2 g via INTRAVENOUS

## 2017-11-12 MED ORDER — OXYCODONE HCL 5 MG/5ML PO SOLN: 5.0000 mg | Freq: Once | ORAL | Status: AC | PRN

## 2017-11-12 MED ORDER — SODIUM CHLORIDE 0.9% FLUSH: 3.0000 mL | Freq: Two times a day (BID) | INTRAVENOUS | Status: DC

## 2017-11-12 SURGICAL SUPPLY — 35 items
BLADE SURG 11 STRL SS (BLADE) ×2 IMPLANT
BLADE SURG 15 STRL LF DISP TIS (BLADE) ×1 IMPLANT
BLADE SURG 15 STRL SS (BLADE) ×1
CHLORAPREP W/TINT 10.5 ML (MISCELLANEOUS) ×2 IMPLANT
COVER SURGICAL LIGHT HANDLE (MISCELLANEOUS) ×2 IMPLANT
DECANTER SPIKE VIAL GLASS SM (MISCELLANEOUS) ×2 IMPLANT
DERMABOND ADVANCED (GAUZE/BANDAGES/DRESSINGS) ×1
DERMABOND ADVANCED .7 DNX12 (GAUZE/BANDAGES/DRESSINGS) ×1 IMPLANT
DRAPE LAPAROTOMY 100X72 PEDS (DRAPES) ×2 IMPLANT
DRAPE UTILITY XL STRL (DRAPES) ×2 IMPLANT
ELECT CAUTERY BLADE 6.4 (BLADE) ×2 IMPLANT
ELECT REM PT RETURN 9FT ADLT (ELECTROSURGICAL) ×2
ELECTRODE REM PT RTRN 9FT ADLT (ELECTROSURGICAL) ×1 IMPLANT
GAUZE SPONGE 4X4 16PLY XRAY LF (GAUZE/BANDAGES/DRESSINGS) ×2 IMPLANT
GLOVE BIO SURGEON STRL SZ7.5 (GLOVE) ×2 IMPLANT
GLOVE EUDERMIC 7 POWDERFREE (GLOVE) ×2 IMPLANT
GOWN STRL REUS W/ TWL LRG LVL3 (GOWN DISPOSABLE) ×2 IMPLANT
GOWN STRL REUS W/ TWL XL LVL3 (GOWN DISPOSABLE) ×1 IMPLANT
GOWN STRL REUS W/TWL LRG LVL3 (GOWN DISPOSABLE) ×2
GOWN STRL REUS W/TWL XL LVL3 (GOWN DISPOSABLE) ×1
KIT BASIN OR (CUSTOM PROCEDURE TRAY) ×2 IMPLANT
KIT TURNOVER KIT B (KITS) ×2 IMPLANT
LOOP VESSEL MINI RED (MISCELLANEOUS) ×2 IMPLANT
NEEDLE HYPO 25GX1X1/2 BEV (NEEDLE) ×2 IMPLANT
NS IRRIG 1000ML POUR BTL (IV SOLUTION) ×2 IMPLANT
PACK SURGICAL SETUP 50X90 (CUSTOM PROCEDURE TRAY) ×2 IMPLANT
PAD ARMBOARD 7.5X6 YLW CONV (MISCELLANEOUS) ×4 IMPLANT
PENCIL BUTTON HOLSTER BLD 10FT (ELECTRODE) ×2 IMPLANT
SUT MNCRL AB 4-0 PS2 18 (SUTURE) ×4 IMPLANT
SUT PROLENE 6 0 BV (SUTURE) ×2 IMPLANT
SUT VIC AB 3-0 SH 18 (SUTURE) ×2 IMPLANT
SYR BULB 3OZ (MISCELLANEOUS) ×2 IMPLANT
SYR CONTROL 10ML LL (SYRINGE) ×2 IMPLANT
TOWEL OR 17X24 6PK STRL BLUE (TOWEL DISPOSABLE) ×2 IMPLANT
TOWEL OR 17X26 10 PK STRL BLUE (TOWEL DISPOSABLE) ×2 IMPLANT

## 2017-11-12 NOTE — Op Note (Signed)
Operative Note  [name]  [id number]  [id number]  11/12/2017   Surgeon: Victorino Sparrow ConnorMD  Assistant: Armandina Gemma MD and Ruta Hinds MD  Procedure performed: removal of port-a-cath  Preop diagnosis: port in place Post-op diagnosis/intraop findings: port tubing was adherent within the vessel but ultimately pulled intact  Specimens: no Retained items: no EBL: minimal cc Complications: none  Description of procedure: After obtaining informed consent the patient was taken to the operating room and placed supine on operating room table [facility] was initiated, preoperative antibiotics were administered, SCDs applied, and a formal timeout was performed. The left chest and neck were prepped and draped in the usual sterile fashion. A 3cm incision was made over the port and cautery used to dissect the soft tissue until the port could be delivered into the field and securing sutures cut. The capsule was excised and hemostasis ensured in the wound. However, when the tubing was pulled, there was significant resistance. Gentle blunt dissection along the subcutaneous tract did loosen some fibrous scarring around the tubing, but we were still getting substantial resistance. A counter incision was made over the left neck where the catheter enters the vein and the tubing was identified and dissected free. More mild fibrous sheath was dissected from the tubing but it remained adherent within the chest. Dr. Oneida Alar was consulted intraoperatively and with increased traction he was able to pull the catheter. The end appeared to be a clean-cut appropriate length tubing, no sign of torn tubing. Pressure was held on the IJ for several minutes and hemostasis ensured. Both incisions were closed with interrupted deep dermal 3-0 vicryls, running subcuticular monocryl and dermabond. The patient was then awakened, extubated and taken to PACU in stable condition.   All counts were correct at the completion of  the case.

## 2017-11-12 NOTE — Anesthesia Postprocedure Evaluation (Signed)
Anesthesia Post Note  Patient: [name]  Procedure(s) Performed: REMOVAL PORT-A-CATH (N/A Chest)     Patient location during evaluation: PACU Anesthesia Type: General Level of consciousness: awake Pain management: pain level controlled Vital Signs Assessment: post-procedure vital signs reviewed and stable Respiratory status: spontaneous breathing Cardiovascular status: stable Postop Assessment: no apparent nausea or vomiting Anesthetic complications: no    Last Vitals:  Vitals:   11/12/17 1335 11/12/17 1400  BP: 122/75 133/81  Pulse: (!) 55 (!) 50  Resp: 13 16  Temp: 36.5 C   SpO2: 100% 100%    Last Pain:  Vitals:   11/12/17 1335  TempSrc:   PainSc: 4    Pain Goal:                 Hanya Guerin JR,JOHN Nannette Zill

## 2017-11-12 NOTE — Transfer of Care (Signed)
Immediate Anesthesia Transfer of Care Note  Patient: [name]  Procedure(s) Performed: REMOVAL PORT-A-CATH (N/A Chest)  Patient Location: PACU  Anesthesia Type:MAC and General  Level of Consciousness: awake, alert  and oriented  Airway & Oxygen Therapy: Patient Spontanous Breathing and Patient connected to nasal cannula oxygen  Post-op Assessment: Report given to RN, Post -op Vital signs reviewed and stable and Patient moving all extremities X 4  Post vital signs: Reviewed and stable  Last Vitals:  Vitals Value Taken Time  BP 103/73 11/12/2017 12:47 PM  Temp    Pulse 58 11/12/2017 12:51 PM  Resp 14 11/12/2017 12:51 PM  SpO2 100 % 11/12/2017 12:51 PM  Vitals shown include unvalidated device data.  Last Pain:  Vitals:   11/12/17 0927  TempSrc:   PainSc: 6          Complications: No apparent anesthesia complications

## 2017-11-12 NOTE — Interval H&P Note (Signed)
History and Physical Interval Note:  11/12/2017 10:02 AM  [name]  has presented today for surgery, with the diagnosis of PORT  The various methods of treatment have been discussed with the patient and family. After consideration of risks, benefits and other options for treatment, the patient has consented to  Procedure(s): REMOVAL PORT-A-CATH (N/A) as a surgical intervention .  The patient's history has been reviewed, patient examined, no change in status, stable for surgery.  I have reviewed the patient's chart and labs.  Questions were answered to the patient's satisfaction.     Chelsea Rich Brave

## 2017-11-12 NOTE — Discharge Instructions (Signed)
GENERAL SURGERY: POST OP INSTRUCTIONS  ######################################################################  EAT Gradually transition to a high fiber diet with a fiber supplement over the next few weeks after discharge.  Start with a pureed / full liquid diet (see below)  WALK Walk an hour a day.  Control your pain to do that.    CONTROL PAIN Control pain so that you can walk, sleep, tolerate sneezing/coughing, go up/down stairs.  HAVE A BOWEL MOVEMENT DAILY Keep your bowels regular to avoid problems.  OK to try a laxative to override constipation.  OK to use an antidairrheal to slow down diarrhea.  Call if not better after 2 tries  CALL IF YOU HAVE PROBLEMS/CONCERNS Call if you are still struggling despite following these instructions. Call if you have concerns not answered by these instructions  ######################################################################    1. DIET: Follow a light bland diet the first 24 hours after arrival home, such as soup, liquids, crackers, etc.  Be sure to include lots of fluids daily.  Avoid fast food or heavy meals as your are more likely to get nauseated.   2. Take your usually prescribed home medications unless otherwise directed. 3. PAIN CONTROL: a. Pain is best controlled by a usual combination of three different methods TOGETHER: i. Ice/Heat ii. Over the counter pain medication iii. Prescription pain medication b. Most patients will experience some swelling and bruising around the incisions.  Ice packs or heating pads (30-60 minutes up to 6 times a day) will help. Use ice for the first few days to help decrease swelling and bruising, then switch to heat to help relax tight/sore spots and speed recovery.  Some people prefer to use ice alone, heat alone, alternating between ice & heat.  Experiment to what works for you.  Swelling and bruising can take several weeks to resolve.   c. It is helpful to take an over-the-counter pain medication  regularly for the first few weeks.  Choose one of the following that works best for you: i. Naproxen (Aleve, etc)  Two 220mg  tabs twice a day ii. Ibuprofen (Advil, etc) Three 200mg  tabs four times a day (every meal & bedtime) iii. Acetaminophen (Tylenol, etc) 500-650mg  four times a day (every meal & bedtime) d. A  prescription for pain medication is already on your home medication list.  Take your pain medication as prescribed.  i. If you are having problems/concerns with the prescription medicine (does not control pain, nausea, vomiting, rash, itching, etc), please call the physician who manages your pain medications to see if they need to switch you to a different pain medicine that will work better for you and/or control your side effect better. ii. Prescriptions will not be filled after 5 pm or on week-ends. 4. Avoid getting constipated.  Between the surgery and the pain medications, it is common to experience some constipation.  Increasing fluid intake and taking a fiber supplement (such as Metamucil, Citrucel, FiberCon, MiraLax, etc) 1-2 times a day regularly will usually help prevent this problem from occurring.  A mild laxative (prune juice, Milk of Magnesia, MiraLax, etc) should be taken according to package directions if there are no bowel movements after 48 hours.   5. Wash / shower every day.  You may shower over the skin glue which is waterproof.  Continue to shower over incision(s) after the dressing is off. 6. Skin glue will flake off after 2 weeks.  You may leave the incision open to air.   You may replace a dressing/Band-Aid to cover the  incision for comfort if you wish.      7. ACTIVITIES as tolerated:   a. You may resume regular (light) daily activities beginning the next day--such as daily self-care, walking, climbing stairs--gradually increasing activities as tolerated.  If you can walk 30 minutes without difficulty, it is safe to try more intense activity such as jogging,  treadmill, bicycling, low-impact aerobics, swimming, etc. b. Save the most intensive and strenuous activity for last such as sit-ups, heavy lifting, contact sports, etc  Refrain from any heavy lifting or straining until you are off narcotics for pain control.   c. DO NOT PUSH THROUGH PAIN.  Let pain be your guide: If it hurts to do something, don't do it.  Pain is your body warning you to avoid that activity for another week until the pain goes down. d. You may drive when you are no longer taking prescription pain medication, you can comfortably wear a seatbelt, and you can safely maneuver your car and apply brakes. e. Dennis Bast may have sexual intercourse when it is comfortable.  8. FOLLOW UP in our office a. Please call [facility] at [phone] to set up an appointment to see your surgeon in the office for a follow-up appointment approximately 2-3 weeks after your surgery. b. Make sure that you call for this appointment the day you arrive home to insure a convenient appointment time. 9. IF YOU HAVE DISABILITY OR FAMILY LEAVE FORMS, BRING THEM TO THE OFFICE FOR PROCESSING.  DO NOT GIVE THEM TO YOUR DOCTOR.   WHEN TO CALL us [phone]: 1. Poor pain control 2. Reactions / problems with new medications (rash/itching, nausea, etc)  3. Fever over 101.5 F (38.5 C) 4. Worsening swelling or bruising 5. Continued bleeding from incision. 6. Increased pain, redness, or drainage from the incision 7. Difficulty breathing / swallowing   The clinic staff is available to answer your questions during regular business hours (8:30am-5pm).  Please dont hesitate to call and ask to speak to one of our nurses for clinical concerns.   If you have a medical emergency, go to the nearest emergency room or call 911.  A surgeon from [facility] is always on call at the [facility], [place], [place], [place], [place]  [id number] ? MAIN: [phone] ? TOLL FREE:  [phone] ?  FAX [phone] [email or web address]

## 2017-11-12 NOTE — Anesthesia Procedure Notes (Deleted)
Procedure Name: MAC Date/Time: 11/12/2017 10:54 AM Performed by: Mariea Clonts, CRNA Pre-anesthesia Checklist: Patient identified, Emergency Drugs available, Suction available, Patient being monitored and Timeout performed Patient Re-evaluated:Patient Re-evaluated prior to induction Oxygen Delivery Method: Simple face mask and Nasal cannula

## 2017-11-12 NOTE — Anesthesia Preprocedure Evaluation (Addendum)
Anesthesia Evaluation  Patient identified by MRN, date of birth, ID band Patient awake    Reviewed: Allergy & Precautions, NPO status , Patient's Chart, lab work & pertinent test results  Airway Mallampati: I       Dental no notable dental hx. (+) Teeth Intact   Pulmonary neg pulmonary ROS,    Pulmonary exam normal breath sounds clear to auscultation       Cardiovascular hypertension, Pt. on medications Normal cardiovascular exam Rhythm:Regular Rate:Normal     Neuro/Psych    GI/Hepatic negative GI ROS, Neg liver ROS,   Endo/Other  diabetes  Renal/GU negative Renal ROS     Musculoskeletal   Abdominal (+) + obese,   Peds  Hematology   Anesthesia Other Findings   Reproductive/Obstetrics                             Anesthesia Physical Anesthesia Plan  ASA: II  Anesthesia Plan: MAC   Post-op Pain Management:    Induction:   PONV Risk Score and Plan: 1 and Treatment may vary due to age or medical condition  Airway Management Planned: Natural Airway and Nasal Cannula  Additional Equipment:   Intra-op Plan:   Post-operative Plan:   Informed Consent: I have reviewed the patients History and Physical, chart, labs and discussed the procedure including the risks, benefits and alternatives for the proposed anesthesia with the patient or authorized representative who has indicated his/her understanding and acceptance.     Plan Discussed with: CRNA and Surgeon  Anesthesia Plan Comments:        Anesthesia Quick Evaluation

## 2017-11-12 NOTE — Anesthesia Procedure Notes (Signed)
Procedure Name: LMA Insertion Performed by: Mariea Clonts, CRNA Pre-anesthesia Checklist: Patient identified, Emergency Drugs available, Suction available and Patient being monitored Patient Re-evaluated:Patient Re-evaluated prior to induction Oxygen Delivery Method: Circle System Utilized Preoxygenation: Pre-oxygenation with 100% oxygen Induction Type: IV induction Ventilation: Mask ventilation without difficulty and Oral airway inserted - appropriate to patient size LMA: LMA inserted LMA Size: 5.0 Number of attempts: 1 Airway Equipment and Method: Bite block Placement Confirmation: positive ETCO2 Tube secured with: Tape Dental Injury: Teeth and Oropharynx as per pre-operative assessment

## 2017-11-13 ENCOUNTER — Encounter (HOSPITAL_COMMUNITY): Payer: Self-pay | Admitting: Surgery

## 2017-11-22 DIAGNOSIS — I10 Essential (primary) hypertension: Secondary | ICD-10-CM | POA: Diagnosis not present

## 2017-11-22 DIAGNOSIS — E291 Testicular hypofunction: Secondary | ICD-10-CM | POA: Diagnosis not present

## 2017-11-22 DIAGNOSIS — E7800 Pure hypercholesterolemia, unspecified: Secondary | ICD-10-CM | POA: Diagnosis not present

## 2017-11-22 DIAGNOSIS — E1165 Type 2 diabetes mellitus with hyperglycemia: Secondary | ICD-10-CM | POA: Diagnosis not present

## 2017-11-22 DIAGNOSIS — E890 Postprocedural hypothyroidism: Secondary | ICD-10-CM | POA: Diagnosis not present

## 2017-11-29 ENCOUNTER — Other Ambulatory Visit: Payer: Self-pay | Admitting: Neurology

## 2017-12-15 ENCOUNTER — Other Ambulatory Visit: Payer: Self-pay | Admitting: Neurology

## 2017-12-28 ENCOUNTER — Other Ambulatory Visit: Payer: Self-pay | Admitting: Neurology

## 2018-01-12 ENCOUNTER — Telehealth: Payer: Self-pay | Admitting: Neurology

## 2018-01-12 MED ORDER — GABAPENTIN 300 MG PO CAPS: 300.0000 mg | ORAL_CAPSULE | Freq: Two times a day (BID) | ORAL | 3 refills | Status: DC

## 2018-01-12 NOTE — Telephone Encounter (Signed)
Dr. Jannifer Franklin- would you like him to see PCP first and f/u with you if needed?

## 2018-01-12 NOTE — Addendum Note (Signed)
Addended by: Kathrynn Ducking on: 01/12/2018 03:56 PM   Modules accepted: Orders

## 2018-01-12 NOTE — Telephone Encounter (Signed)
I called the patient.  The patient has chronic numbness on the anterolateral aspect of the left thigh that may represent a lateral femoral cutaneous nerve injury.  He has developed sharp jabbing pains in the knee area just lateral to the kneecap.  This could represent the end distribution of the lateral femoral cutaneous nerve.  We will give a trial on gabapentin taking 300 mg twice daily.  The patient is not having pain with walking, the pain is more likely to occur with prolonged sitting or with trying to sleep at night.

## 2018-01-12 NOTE — Telephone Encounter (Signed)
Pt is asking for a call to discuss a sharp pain in his knee.  Pt states its an electrical shock that wakes him from his sleep.  Pt states all day at work rather if he has been still, sitting or walking he gets this electrical shock feeling in same spot.  Pt states there has been no injury or swelling.  Please call

## 2018-01-24 ENCOUNTER — Other Ambulatory Visit: Payer: Self-pay | Admitting: Neurology

## 2018-02-10 ENCOUNTER — Other Ambulatory Visit: Payer: Self-pay | Admitting: Neurology

## 2018-02-23 ENCOUNTER — Telehealth: Payer: Self-pay | Admitting: Neurology

## 2018-02-23 ENCOUNTER — Other Ambulatory Visit: Payer: Self-pay | Admitting: Neurology

## 2018-02-23 MED ORDER — DULOXETINE HCL 30 MG PO CPEP: ORAL_CAPSULE | ORAL | 3 refills | Status: DC

## 2018-02-23 NOTE — Telephone Encounter (Signed)
I called the patient.  The patient claims that his dog died 2 weeks ago, he has had increased problems with anxiety and panic attacks.  If his mind is not occupied with something, he has a tendency to get anxious and nervous.  In the past, Cymbalta has been helpful, I will restart this for him.  He will call for any dose adjustments.

## 2018-02-23 NOTE — Telephone Encounter (Signed)
Pt is asking for a call to discuss his high level of Anxiety, he is on medication but would like to discuss the management of his medication for it

## 2018-02-23 NOTE — Addendum Note (Signed)
Addended by: Kathrynn Ducking on: 02/23/2018 01:07 PM   Modules accepted: Orders

## 2018-03-23 ENCOUNTER — Other Ambulatory Visit: Payer: Self-pay | Admitting: Neurology

## 2018-04-18 ENCOUNTER — Other Ambulatory Visit: Payer: Self-pay | Admitting: Neurology

## 2018-04-18 NOTE — Telephone Encounter (Signed)
[place] Database Verified LR 03-23-18 Qty: 60 Pending appointment 11-09-2018.

## 2018-04-20 DIAGNOSIS — G7080 Lambert-Eaton syndrome, unspecified: Secondary | ICD-10-CM | POA: Diagnosis not present

## 2018-04-20 DIAGNOSIS — Z23 Encounter for immunization: Secondary | ICD-10-CM | POA: Diagnosis not present

## 2018-05-09 ENCOUNTER — Other Ambulatory Visit: Payer: Self-pay | Admitting: Neurology

## 2018-05-16 ENCOUNTER — Other Ambulatory Visit: Payer: Self-pay | Admitting: Neurology

## 2018-05-18 ENCOUNTER — Other Ambulatory Visit: Payer: Self-pay | Admitting: Neurology

## 2018-05-23 DIAGNOSIS — E7800 Pure hypercholesterolemia, unspecified: Secondary | ICD-10-CM | POA: Diagnosis not present

## 2018-05-23 DIAGNOSIS — I10 Essential (primary) hypertension: Secondary | ICD-10-CM | POA: Diagnosis not present

## 2018-05-23 DIAGNOSIS — E890 Postprocedural hypothyroidism: Secondary | ICD-10-CM | POA: Diagnosis not present

## 2018-05-23 DIAGNOSIS — E291 Testicular hypofunction: Secondary | ICD-10-CM | POA: Diagnosis not present

## 2018-05-23 DIAGNOSIS — E1165 Type 2 diabetes mellitus with hyperglycemia: Secondary | ICD-10-CM | POA: Diagnosis not present

## 2018-05-24 ENCOUNTER — Telehealth: Payer: Self-pay

## 2018-05-24 NOTE — Telephone Encounter (Signed)
Pt is requesting Dr. Jannifer Franklin complete/sign handicap placard. Form given to Dr. Jannifer Franklin to sign if appropriate.

## 2018-05-25 NOTE — Telephone Encounter (Signed)
Dr. Eugenie Birks reviewed/signed handicap placard.  Per pt's request he would like the form mailed to [place] [id number]. Letter placed in out going mail at [facility].

## 2018-05-30 DIAGNOSIS — E890 Postprocedural hypothyroidism: Secondary | ICD-10-CM | POA: Diagnosis not present

## 2018-05-30 DIAGNOSIS — I10 Essential (primary) hypertension: Secondary | ICD-10-CM | POA: Diagnosis not present

## 2018-05-30 DIAGNOSIS — E1165 Type 2 diabetes mellitus with hyperglycemia: Secondary | ICD-10-CM | POA: Diagnosis not present

## 2018-05-30 DIAGNOSIS — E291 Testicular hypofunction: Secondary | ICD-10-CM | POA: Diagnosis not present

## 2018-05-30 DIAGNOSIS — E7800 Pure hypercholesterolemia, unspecified: Secondary | ICD-10-CM | POA: Diagnosis not present

## 2018-06-08 ENCOUNTER — Ambulatory Visit: Payer: Self-pay

## 2018-06-08 DIAGNOSIS — Z6830 Body mass index (BMI) 30.0-30.9, adult: Secondary | ICD-10-CM | POA: Diagnosis not present

## 2018-06-08 DIAGNOSIS — R5381 Other malaise: Secondary | ICD-10-CM | POA: Diagnosis not present

## 2018-06-08 DIAGNOSIS — J069 Acute upper respiratory infection, unspecified: Secondary | ICD-10-CM | POA: Diagnosis not present

## 2018-06-08 DIAGNOSIS — R05 Cough: Secondary | ICD-10-CM | POA: Diagnosis not present

## 2018-06-09 ENCOUNTER — Other Ambulatory Visit: Payer: Self-pay | Admitting: Neurology

## 2018-06-14 ENCOUNTER — Other Ambulatory Visit: Payer: Self-pay | Admitting: Neurology

## 2018-06-15 NOTE — Telephone Encounter (Signed)
The hydrocodone will be sent in.

## 2018-07-11 ENCOUNTER — Telehealth: Payer: Self-pay | Admitting: Neurology

## 2018-07-11 NOTE — Telephone Encounter (Signed)
I called the patient.  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  Wash his hands following any contact.  I think his risk for exposure is minimal if he follows the above recommendations.

## 2018-07-14 ENCOUNTER — Other Ambulatory Visit: Payer: Self-pay | Admitting: Neurology

## 2018-07-18 ENCOUNTER — Other Ambulatory Visit: Payer: Self-pay | Admitting: Neurology

## 2018-07-18 NOTE — Telephone Encounter (Signed)
The [facility] registry was checked, the hydrocodone prescription was sent in.

## 2018-08-13 ENCOUNTER — Other Ambulatory Visit: Payer: Self-pay | Admitting: Neurology

## 2018-08-15 DIAGNOSIS — M2550 Pain in unspecified joint: Secondary | ICD-10-CM | POA: Diagnosis not present

## 2018-08-15 DIAGNOSIS — E7800 Pure hypercholesterolemia, unspecified: Secondary | ICD-10-CM | POA: Diagnosis not present

## 2018-08-15 DIAGNOSIS — E890 Postprocedural hypothyroidism: Secondary | ICD-10-CM | POA: Diagnosis not present

## 2018-09-08 ENCOUNTER — Other Ambulatory Visit: Payer: Self-pay | Admitting: Neurology

## 2018-09-09 ENCOUNTER — Other Ambulatory Visit: Payer: Self-pay | Admitting: Neurology

## 2018-09-12 ENCOUNTER — Other Ambulatory Visit: Payer: Self-pay | Admitting: Neurology

## 2018-10-06 ENCOUNTER — Other Ambulatory Visit: Payer: Self-pay | Admitting: Neurology

## 2018-11-09 ENCOUNTER — Ambulatory Visit (INDEPENDENT_AMBULATORY_CARE_PROVIDER_SITE_OTHER): Payer: Medicare Other | Admitting: Neurology

## 2018-11-09 ENCOUNTER — Other Ambulatory Visit: Payer: Self-pay

## 2018-11-09 ENCOUNTER — Other Ambulatory Visit: Payer: Self-pay | Admitting: Neurology

## 2018-11-09 ENCOUNTER — Encounter: Payer: Self-pay | Admitting: Neurology

## 2018-11-09 VITALS — BP 132/70 | HR 76 | Temp 97.5°F | Ht 70.0 in | Wt 210.2 lb

## 2018-11-09 DIAGNOSIS — G8929 Other chronic pain: Secondary | ICD-10-CM

## 2018-11-09 DIAGNOSIS — G7080 Lambert-Eaton syndrome, unspecified: Secondary | ICD-10-CM | POA: Diagnosis not present

## 2018-11-09 DIAGNOSIS — M545 Low back pain: Secondary | ICD-10-CM | POA: Diagnosis not present

## 2018-11-09 MED ORDER — DULOXETINE HCL 30 MG PO CPEP: 60.0000 mg | ORAL_CAPSULE | Freq: Every day | ORAL | 1 refills | Status: DC

## 2018-11-09 NOTE — Telephone Encounter (Signed)
Prescriptions for hydrocodone and Cymbalta will be refilled.

## 2018-11-09 NOTE — Progress Notes (Signed)
Reason for visit: [name], chronic low back pain  [name] is an 58 y.o. male  History of present illness:  [name] is a 58 year old right-handed white male with a history of [name], he has been followed through [facility], he currently is on Ruzurgi for his [name].  He has done well in this regard, he does not have a lot of issues with weakness.  He is having a lot of ongoing stiffness in the low back and neck.  He tends to be worse first thing in the morning and later in the evening, if he gets up and moves around and stays active, he feels better.  He is on gabapentin for his left leg discomfort, while on the gabapentin, this issue is much improved.  He no longer has any tingling sensations in the hands.  Uses a TENS unit at times with good benefit.  He does see a chiropractor occasionally.  He was recently evaluated for rheumatoid arthritis, but this evaluation was negative.  He denies any falls.  Past Medical History:  Diagnosis Date  . Anomalous coronary artery origin    The patient underwent cardiac catheterization in 2004.  Coronaries revealed no stenosis.  However he had aberrant takeoff of both the right and left coronaries from the anterior aortic cusp.  There was no evidence of renal artery stenosis  . Arthritis   . Back pain   . Broken fibula    right  . Depression   . Diabetes mellitus without complication ([place])   . Dyslipidemia   . Dyslipidemia   . Ejection fraction    EF 60-65%, echo, March 18, 2011  . H/O radioactive iodine thyroid ablation   . History of plasmapheresis   . Hypertension   . Hyperthyroidism    November, 201 to  . 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 and Dr. Floyde Parkins  . [name] ([place])   . Murmur    Systolic murmur, December, 2012,No valvular abnormalities, echo, December, 2012  . Obesity   . Pre-syncope    February 23, 2011  . QT interval     Question prolongation QT interval, October, 2012, review of EKG dated February 04, 2011, QT interval was not prolonged    Past Surgical History:  Procedure Laterality Date  . KNEE SURGERY Right   . PORT-A-CATH REMOVAL N/A 11/12/2017   Procedure: REMOVAL PORT-A-CATH;  Surgeon: Clovis Riley, MD;  Location: [facility];  Service: General;  Laterality: N/A;  . PORTACATH PLACEMENT    . TONSILLECTOMY      Family History  Problem Relation Age of Onset  . Hypertension Mother   . Cirrhosis Father   . Cirrhosis Maternal Grandmother        non alcoholic  . Heart disease Unknown   . Stroke Unknown   . Cancer Unknown     Social history:  reports that he has never smoked. He has never used smokeless tobacco. He reports current alcohol use. He reports that he does not use drugs.    Allergies  Allergen Reactions  . Statins Other (See Comments)    MYALGIAS  . Rituximab     UNSPECIFIED REACTION   . Toradol [Ketorolac Tromethamine]     UNSPECIFIED REACTION   . Vioxx [Rofecoxib]     UNSPECIFIED REACTION     Medications:  Prior to Admission medications   Medication Sig Start Date End Date Taking? Authorizing Provider  ALPRAZolam Duanne Moron)  0.5 MG tablet TAKE (1) TABLET THREE TIMES DAILY AS NEEDED. 09/08/18  Yes Kathrynn Ducking, MD  DULoxetine (CYMBALTA) 30 MG capsule Take 2 capsules (60 mg total) by mouth daily. 11/09/18  Yes Kathrynn Ducking, MD  gabapentin (NEURONTIN) 300 MG capsule TAKE  (1)  CAPSULE  TWICE DAILY. 06/10/18  Yes Kathrynn Ducking, MD  HYDROcodone-acetaminophen (NORCO/VICODIN) 5-325 MG tablet TAKE (1) TABLET EVERY SIX HOURS AS NEEDED. MUST LAST 28 DAYS 11/09/18  Yes Kathrynn Ducking, MD  levothyroxine (SYNTHROID, LEVOTHROID) 150 MCG tablet Take 150 mcg by mouth daily before breakfast. 10/30/17  Yes [provider]  lisinopril (PRINIVIL,ZESTRIL) 5 MG tablet TAKE 1 TABLET (5 MG TOTAL) BY MOUTH DAILY.   Yes Lysbeth Penner, FNP  Multiple Vitamin (MULTIVITAMIN WITH  MINERALS) TABS tablet Take 1 tablet by mouth daily. One-A-Day   Yes [provider]  Omega-3 Fatty Acids (FISH OIL) 1200 MG CAPS Take 1,200 mg by mouth 3 (three) times daily.   Yes [provider]  pyridostigmine (MESTINON) 60 MG tablet TAKE  (1)  TABLET  THREE TIMES DAILY. 02/10/18  Yes Kathrynn Ducking, MD  RUZURGI 10 MG TABS 20 mg daily 10/17/18  Yes [provider]  zolpidem (AMBIEN) 10 MG tablet TAKE 1 TABLET AT BEDTIME AS NEEDED FOR SLEEP 06/10/18  Yes Kathrynn Ducking, MD    ROS:  Out of a complete 14 system review of symptoms, the patient complains only of the following symptoms, and all other reviewed systems are negative.  Joint pain, back pain, neck pain Depression  Blood pressure 132/70, pulse 76, temperature (!) 97.5 F (36.4 C), temperature source Temporal, height 5\' 10"  (1.778 m), weight 210 lb 4 oz (95.4 kg), SpO2 98 %.  Physical Exam  General: The patient is alert and cooperative at the time of the examination.  The patient is moderately obese.  Neuromuscular: The patient has relatively good flexion mobility of the low back but limited rotational movement.  The patient lacks about 15 to 20 degrees of full lateral rotation of the cervical spine.  Skin: No significant peripheral edema is noted.   Neurologic Exam  Mental status: The patient is alert and oriented x 3 at the time of the examination. The patient has apparent normal recent and remote memory, with an apparently normal attention span and concentration ability.   Cranial nerves: Facial symmetry is present. Speech is normal, no aphasia or dysarthria is noted. Extraocular movements are full. Visual fields are full.  Motor: The patient has good strength in all 4 extremities.  Sensory examination: Soft touch sensation is symmetric on the face, arms, and legs.  Coordination: The patient has good finger-nose-finger and heel-to-shin bilaterally.  Gait and station: The patient has a  normal gait. Tandem gait is normal. Romberg is negative. No drift is seen.  Reflexes: Deep tendon reflexes are symmetric.   Assessment/Plan:  1.  [name]  2.  Chronic low back pain  3.  History of depression  The patient feels that Cymbalta has been quite effective for his depression issues.  This may also help some of the low back pain problems.  The patient appears to have a history consistent with arthritic changes of the back and neck, he may try a scheduled dose of Aleve taking 2 tablets twice daily.  The patient will continue to take hydrocodone if needed.  He does report some occasional poly-myoclonus on gabapentin, but this is not severe.  He will follow-up here in  6 months.  Jill Alexanders MD 11/09/2018 10:40 AM  [facility] [address] [place], [place] [address]  Phone [phone] Fax [phone]

## 2018-12-05 ENCOUNTER — Other Ambulatory Visit: Payer: Self-pay | Admitting: Neurology

## 2018-12-07 ENCOUNTER — Other Ambulatory Visit: Payer: Self-pay | Admitting: *Deleted

## 2018-12-07 ENCOUNTER — Other Ambulatory Visit: Payer: Self-pay | Admitting: Neurology

## 2019-01-03 ENCOUNTER — Other Ambulatory Visit: Payer: Self-pay | Admitting: Neurology

## 2019-01-06 ENCOUNTER — Other Ambulatory Visit: Payer: Self-pay | Admitting: Neurology

## 2019-01-30 ENCOUNTER — Other Ambulatory Visit: Payer: Self-pay | Admitting: Neurology

## 2019-02-08 ENCOUNTER — Other Ambulatory Visit: Payer: Self-pay | Admitting: Neurology

## 2019-02-08 NOTE — Telephone Encounter (Signed)
[place] [facility] Verified LR: 01-09-2019 Qty: 90 Pending appointment: 05-17-2019 Butler Denmark, NP)

## 2019-03-01 ENCOUNTER — Other Ambulatory Visit: Payer: Self-pay | Admitting: Neurology

## 2019-03-01 NOTE — Telephone Encounter (Signed)
Last fill date for hydrocodone was 02/01/2019. Pt is up to date on his appts. [place] Controlled Substance Registry checked. Will send to [facility] in Dr. Jannifer Franklin' absence.

## 2019-03-08 ENCOUNTER — Other Ambulatory Visit: Payer: Self-pay | Admitting: Neurology

## 2019-03-27 ENCOUNTER — Other Ambulatory Visit: Payer: Self-pay | Admitting: Neurology

## 2019-03-28 ENCOUNTER — Other Ambulatory Visit: Payer: Self-pay | Admitting: Neurology

## 2019-04-05 ENCOUNTER — Other Ambulatory Visit: Payer: Self-pay | Admitting: Neurology

## 2019-04-06 DIAGNOSIS — J3489 Other specified disorders of nose and nasal sinuses: Secondary | ICD-10-CM | POA: Diagnosis not present

## 2019-04-06 DIAGNOSIS — J988 Other specified respiratory disorders: Secondary | ICD-10-CM | POA: Diagnosis not present

## 2019-04-06 DIAGNOSIS — Z6830 Body mass index (BMI) 30.0-30.9, adult: Secondary | ICD-10-CM | POA: Diagnosis not present

## 2019-04-06 DIAGNOSIS — R509 Fever, unspecified: Secondary | ICD-10-CM | POA: Diagnosis not present

## 2019-04-22 DIAGNOSIS — Z23 Encounter for immunization: Secondary | ICD-10-CM | POA: Diagnosis not present

## 2019-04-26 DIAGNOSIS — G7080 Lambert-Eaton syndrome, unspecified: Secondary | ICD-10-CM | POA: Diagnosis not present

## 2019-05-10 ENCOUNTER — Encounter: Payer: Self-pay | Admitting: Neurology

## 2019-05-10 ENCOUNTER — Telehealth: Payer: Self-pay | Admitting: Neurology

## 2019-05-10 NOTE — Telephone Encounter (Signed)
Please call patient back regarding his question on COVID-19.  If he has symptoms such as shortness of breath, chest pain, cough with fever, he will need to go to the emergency room immediately.

## 2019-05-10 NOTE — Telephone Encounter (Signed)
I called pt about having some shortness of breath at times since he recover from [place] 19. 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 Pt states he feels better and is working full time. He checks his O2 daily and it has been 96%. I advise pt to stay hydrated and eat meals. Pt has an appt with Judson Roch NP next week in our office. I advise pt if he gets fever, chest pain, fever vomiting seek nearest ED.He verbalized understanding.

## 2019-05-15 ENCOUNTER — Other Ambulatory Visit: Payer: Self-pay | Admitting: Neurology

## 2019-05-16 NOTE — Progress Notes (Signed)
PATIENT: [name] DOB: [birth year]  REASON FOR VISIT: follow up HISTORY FROM: patient  HISTORY OF PRESENT ILLNESS: Today 05/17/19  [name] is a 59 year old male with history of [name].  He is followed through [facility], he is currently on Ruzurgi for his [name].  He developed COVID on April 05, 2019, presenting as fever and headache.  He initially did not have any respiratory symptoms, several weeks later, he developed shortness of breath, even at rest, felt it was difficult for him to take a deep breath.  He continues to suffer from fatigue.  He has not had any muscle weakness in regards to his Lambert-Eaton.  He works as an Stage manager, has returned to work, yesterday he picked up a 81 pound pitbull, without problem.  He currently does not have a primary care doctor, is working to establish with Dr. Jacelyn Grip at [facility].  He says he went to urgent care, had a chest x-ray that was normal.  He continues to have shortness of breath, and fatigue.  He remains on his medications prescribed from this office including, Xanax, Cymbalta, gabapentin, Vicodin, Mestinon, and Ambien.  He presents today for evaluation unaccompanied.  HISTORY 11/09/2018 Dr. Jannifer Franklin: [name] is a 59 year old right-handed white male with a history of [name], he has been followed through [facility], he currently is on Ruzurgi for his [name].  He has done well in this regard, he does not have a lot of issues with weakness.  He is having a lot of ongoing stiffness in the low back and neck.  He tends to be worse first thing in the morning and later in the evening, if he gets up and moves around and stays active, he feels better.  He is on gabapentin for his left leg discomfort, while on the gabapentin, this issue is much improved.  He no longer has any tingling sensations in the hands.  Uses a TENS unit at times with good benefit.  He does see  a chiropractor occasionally.  He was recently evaluated for rheumatoid arthritis, but this evaluation was negative.  He denies any falls.   REVIEW OF SYSTEMS: Out of a complete 14 system review of symptoms, the patient complains only of the following symptoms, and all other reviewed systems are negative.  Fatigue  ALLERGIES: Allergies  Allergen Reactions  . Statins Other (See Comments)    MYALGIAS  . Rituximab     UNSPECIFIED REACTION   . Toradol [Ketorolac Tromethamine]     UNSPECIFIED REACTION   . Vioxx [Rofecoxib]     UNSPECIFIED REACTION     HOME MEDICATIONS: Outpatient Medications Prior to Visit  Medication Sig Dispense Refill  . ALPRAZolam (XANAX) 0.5 MG tablet TAKE (1) TABLET THREE TIMES DAILY AS NEEDED. 90 tablet 3  . ezetimibe (ZETIA) 10 MG tablet Take 10 mg by mouth daily.    Marland Kitchen HYDROcodone-acetaminophen (NORCO/VICODIN) 5-325 MG tablet TAKE (1) TABLET EVERY SIX HOURS AS NEEDED. MUST LAST 28 DAYS 60 tablet 0  . levothyroxine (SYNTHROID) 137 MCG tablet Take 137 mcg by mouth daily.    Marland Kitchen lisinopril (PRINIVIL,ZESTRIL) 5 MG tablet TAKE 1 TABLET (5 MG TOTAL) BY MOUTH DAILY. 30 tablet 0  . Multiple Vitamin (MULTIVITAMIN WITH MINERALS) TABS tablet Take 1 tablet by mouth daily. One-A-Day    . Omega-3 Fatty Acids (FISH OIL) 1200 MG CAPS Take 1,200 mg by mouth 3 (three) times daily.    Marland Kitchen pyridostigmine (MESTINON) 60 MG tablet  TAKE  (1)  TABLET  THREE TIMES DAILY. 90 tablet 0  . RUZURGI 10 MG TABS 20 mg daily    . zolpidem (AMBIEN) 10 MG tablet TAKE 1 TABLET AT BEDTIME AS NEEDED FOR SLEEP 30 tablet 1  . DULoxetine (CYMBALTA) 30 MG capsule Take 2 capsules (60 mg total) by mouth daily. 180 capsule 0  . gabapentin (NEURONTIN) 300 MG capsule TAKE  (1)  CAPSULE  TWICE DAILY. 60 capsule 5  . levothyroxine (SYNTHROID, LEVOTHROID) 150 MCG tablet Take 150 mcg by mouth daily before breakfast.  4   No facility-administered medications prior to visit.    PAST MEDICAL HISTORY: Past Medical  History:  Diagnosis Date  . Anomalous coronary artery origin    The patient underwent cardiac catheterization in 2004.  Coronaries revealed no stenosis.  However he had aberrant takeoff of both the right and left coronaries from the anterior aortic cusp.  There was no evidence of renal artery stenosis  . Arthritis   . Back pain   . Broken fibula    right  . Depression   . Diabetes mellitus without complication ([place])   . Dyslipidemia   . Dyslipidemia   . Ejection fraction    EF 60-65%, echo, March 18, 2011  . H/O radioactive iodine thyroid ablation   . History of plasmapheresis   . Hypertension   . Hyperthyroidism    November, 201 to  . 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 and Dr. Floyde Parkins  . [name] ([place])   . Murmur    Systolic murmur, December, 2012,No valvular abnormalities, echo, December, 2012  . Obesity   . Pre-syncope    February 23, 2011  . QT interval    Question prolongation QT interval, October, 2012, review of EKG dated February 04, 2011, QT interval was not prolonged    PAST SURGICAL HISTORY: Past Surgical History:  Procedure Laterality Date  . KNEE SURGERY Right   . PORT-A-CATH REMOVAL N/A 11/12/2017   Procedure: REMOVAL PORT-A-CATH;  Surgeon: Clovis Riley, MD;  Location: [facility];  Service: General;  Laterality: N/A;  . PORTACATH PLACEMENT    . TONSILLECTOMY      FAMILY HISTORY: Family History  Problem Relation Age of Onset  . Hypertension Mother   . Cirrhosis Father   . Cirrhosis Maternal Grandmother        non alcoholic  . Heart disease Other   . Stroke Other   . Cancer Other     SOCIAL HISTORY: Social History   Socioeconomic History  . Marital status: Married    Spouse name: Caren Griffins  . Number of children: 2  . Years of education: 33  . Highest education level: Not on file  Occupational History  . Occupation: Freight forwarder    Employer: [employer]  Tobacco Use  . Smoking  status: Never Smoker  . Smokeless tobacco: Never Used  Substance and Sexual Activity  . Alcohol use: Yes    Alcohol/week: 0.0 standard drinks    Comment: social  . Drug use: No  . Sexual activity: Yes  Other Topics Concern  . Not on file  Social History Narrative   Patient lives at home with his wife Caren Griffins).   Disabled.   Education high school.   Right handed.   Caffeine None .   Social Determinants of Health   Financial Resource Strain:   . Difficulty of Paying Living Expenses: Not on file  Food Insecurity:   .  Worried About Charity fundraiser in the Last Year: Not on file  . Ran Out of Food in the Last Year: Not on file  Transportation Needs:   . Lack of Transportation (Medical): Not on file  . Lack of Transportation (Non-Medical): Not on file  Physical Activity:   . Days of Exercise per Week: Not on file  . Minutes of Exercise per Session: Not on file  Stress:   . Feeling of Stress : Not on file  Social Connections:   . Frequency of Communication with Friends and Family: Not on file  . Frequency of Social Gatherings with Friends and Family: Not on file  . Attends Religious Services: Not on file  . Active Member of Clubs or Organizations: Not on file  . Attends Archivist Meetings: Not on file  . Marital Status: Not on file  Intimate Partner Violence:   . Fear of Current or Ex-Partner: Not on file  . Emotionally Abused: Not on file  . Physically Abused: Not on file  . Sexually Abused: Not on file   PHYSICAL EXAM  Vitals:   05/17/19 1019  BP: 122/64  Pulse: 64  Temp: 97.9 F (36.6 C)  TempSrc: Oral  Weight: 227 lb 9.6 oz (103.2 kg)  Height: 5\' 10"  (1.778 m)   Body mass index is 32.66 kg/m.  Generalized: Well developed, in no acute distress  Lungs are clear to auscultation Neurological examination  Mentation: Alert oriented to time, place, history taking. Follows all commands speech and language fluent Cranial nerve II-XII: Pupils were  equal round reactive to light. Extraocular movements were full, visual field were full on confrontational test. Facial sensation and strength were normal. Head turning and shoulder shrug  were normal and symmetric. Motor: The motor testing reveals 5 over 5 strength of all 4 extremities. Good symmetric motor tone is noted throughout.  Sensory: Sensory testing is intact to soft touch on all 4 extremities. No evidence of extinction is noted.  Coordination: Cerebellar testing reveals good finger-nose-finger and heel-to-shin bilaterally.  Gait and station: Gait is normal. Tandem gait is normal. Romberg is negative. No drift is seen.  Reflexes: Deep tendon reflexes are symmetric   DIAGNOSTIC DATA (LABS, IMAGING, TESTING) - I reviewed patient records, labs, notes, testing and imaging myself where available.  Lab Results  Component Value Date   WBC 5.9 11/10/2017   HGB 15.3 11/10/2017   HCT 44.9 11/10/2017   MCV 89.4 11/10/2017   PLT 152 11/10/2017      Component Value Date/Time   NA 140 11/10/2017 0905   NA 137 02/28/2013 0927   K 3.8 11/10/2017 0905   CL 104 11/10/2017 0905   CO2 27 11/10/2017 0905   GLUCOSE 91 11/10/2017 0905   BUN 11 11/10/2017 0905   BUN 12 02/28/2013 0927   CREATININE 0.94 11/10/2017 0905   CALCIUM 9.5 11/10/2017 0905   PROT 6.9 02/28/2013 0927   ALBUMIN 4.5 02/28/2013 0927   AST 55 (H) 02/28/2013 0927   ALT 45 (H) 02/28/2013 0927   ALKPHOS 77 02/28/2013 0927   BILITOT 0.5 02/28/2013 0927   GFRNONAA >60 11/10/2017 0905   GFRAA >60 11/10/2017 0905   Lab Results  Component Value Date   CHOL 269 (H) 02/28/2013   HDL 35 (L) 02/28/2013   LDLCALC 173 (H) 02/28/2013   TRIG 306 (H) 02/28/2013   CHOLHDL 7.7 (H) 02/28/2013   Lab Results  Component Value Date   HGBA1C 7.3% 02/28/2013   No  results found for: VITAMINB12 Lab Results  Component Value Date   TSH 7.520 (H) 02/28/2013    ASSESSMENT AND PLAN 59 y.o. year old male  has a past medical history of  Anomalous coronary artery origin, Arthritis, Back pain, Broken fibula, Depression, Diabetes mellitus without complication ([place]), Dyslipidemia, Dyslipidemia, Ejection fraction, H/O radioactive iodine thyroid ablation, History of plasmapheresis, Hypertension, Hyperthyroidism, Lambert-Eaton myasthenic syndrome ([place]), [name] ([place]), Murmur, Obesity, Pre-syncope, and QT interval. here with:  1.  Rita Ohara syndrome 2.  Recent COVID infection April 05, 2019 3.  Chronic low back pain  There is no subjective or objective muscle weakness noted on exam in regards to his [name].  Since recovering from [place], he has the sensation of shortness of breath and fatigue.  I will place a referral for pulmonology for evaluation given his underlying condition.  However, the diaphragm and respiratory muscles are always moving, which is good for Lambert-Eaton.  He will continue to work to establish a primary care doctor.  He will remain on Xanax, Cymbalta, gabapentin, Vicodin, Mestinon, and Ambien prescribed from this office.  Refill for gabapentin and Cymbalta were sent today.  He will follow-up here in 6 months or sooner if needed.  I did advise his symptoms worsen or if he develops any new symptoms he should let us know.   I spent 25 minutes with the patient. 50% of this time was spent discussing his plan of care.   Butler Denmark, AGNP-C, DNP 05/17/2019, 11:56 AM [facility] [address] [place], [place] [address] [phone]

## 2019-05-17 ENCOUNTER — Other Ambulatory Visit: Payer: Self-pay

## 2019-05-17 ENCOUNTER — Encounter: Payer: Self-pay | Admitting: Neurology

## 2019-05-17 ENCOUNTER — Ambulatory Visit (INDEPENDENT_AMBULATORY_CARE_PROVIDER_SITE_OTHER): Payer: Medicare Other | Admitting: Neurology

## 2019-05-17 VITALS — BP 122/64 | HR 64 | Temp 97.9°F | Ht 70.0 in | Wt 227.6 lb

## 2019-05-17 DIAGNOSIS — M545 Low back pain: Secondary | ICD-10-CM | POA: Diagnosis not present

## 2019-05-17 DIAGNOSIS — G7080 Lambert-Eaton syndrome, unspecified: Secondary | ICD-10-CM

## 2019-05-17 DIAGNOSIS — G8929 Other chronic pain: Secondary | ICD-10-CM

## 2019-05-17 MED ORDER — DULOXETINE HCL 30 MG PO CPEP: 60.0000 mg | ORAL_CAPSULE | Freq: Every day | ORAL | 1 refills | Status: DC

## 2019-05-17 MED ORDER — GABAPENTIN 300 MG PO CAPS: ORAL_CAPSULE | ORAL | 5 refills | Status: DC

## 2019-05-17 NOTE — Patient Instructions (Signed)
Continue current medications, I will refer you to pulmonology to evaluation, return in 6 months

## 2019-05-17 NOTE — Progress Notes (Signed)
I have read the note, and I agree with the clinical assessment and plan.  Zeidy Tayag K Kassem Kibbe   

## 2019-05-20 DIAGNOSIS — Z23 Encounter for immunization: Secondary | ICD-10-CM | POA: Diagnosis not present

## 2019-05-30 DIAGNOSIS — E890 Postprocedural hypothyroidism: Secondary | ICD-10-CM | POA: Diagnosis not present

## 2019-05-30 DIAGNOSIS — E1165 Type 2 diabetes mellitus with hyperglycemia: Secondary | ICD-10-CM | POA: Diagnosis not present

## 2019-05-30 DIAGNOSIS — E7800 Pure hypercholesterolemia, unspecified: Secondary | ICD-10-CM | POA: Diagnosis not present

## 2019-06-02 ENCOUNTER — Other Ambulatory Visit: Payer: Self-pay | Admitting: Neurology

## 2019-06-06 DIAGNOSIS — E890 Postprocedural hypothyroidism: Secondary | ICD-10-CM | POA: Diagnosis not present

## 2019-06-06 DIAGNOSIS — E7800 Pure hypercholesterolemia, unspecified: Secondary | ICD-10-CM | POA: Diagnosis not present

## 2019-06-06 DIAGNOSIS — E291 Testicular hypofunction: Secondary | ICD-10-CM | POA: Diagnosis not present

## 2019-06-06 DIAGNOSIS — I10 Essential (primary) hypertension: Secondary | ICD-10-CM | POA: Diagnosis not present

## 2019-06-06 DIAGNOSIS — E1165 Type 2 diabetes mellitus with hyperglycemia: Secondary | ICD-10-CM | POA: Diagnosis not present

## 2019-06-21 ENCOUNTER — Other Ambulatory Visit: Payer: Self-pay | Admitting: Neurology

## 2019-07-03 ENCOUNTER — Other Ambulatory Visit: Payer: Self-pay | Admitting: Neurology

## 2019-07-18 ENCOUNTER — Other Ambulatory Visit: Payer: Self-pay | Admitting: Neurology

## 2019-07-18 NOTE — Telephone Encounter (Signed)
Drug registry check last refill 06/21/2019 by Dr.Willis.

## 2019-08-02 ENCOUNTER — Other Ambulatory Visit: Payer: Self-pay | Admitting: Neurology

## 2019-08-14 ENCOUNTER — Other Ambulatory Visit: Payer: Self-pay | Admitting: Neurology

## 2019-09-09 ENCOUNTER — Other Ambulatory Visit: Payer: Self-pay | Admitting: Neurology

## 2019-09-20 ENCOUNTER — Telehealth: Payer: Self-pay | Admitting: General Practice

## 2019-09-30 ENCOUNTER — Other Ambulatory Visit: Payer: Self-pay | Admitting: Neurology

## 2019-10-03 NOTE — Telephone Encounter (Addendum)
Patient up to date on appointments. Will send refill to Sarah NP.

## 2019-10-03 NOTE — Telephone Encounter (Signed)
Per [place] registry, last refilled on 09/01/2019 Zolpidem Tartrate 10 Mg Tablet #30 for 30 day supply.

## 2019-10-05 DIAGNOSIS — E890 Postprocedural hypothyroidism: Secondary | ICD-10-CM | POA: Diagnosis not present

## 2019-10-05 DIAGNOSIS — I10 Essential (primary) hypertension: Secondary | ICD-10-CM | POA: Diagnosis not present

## 2019-10-05 DIAGNOSIS — E1165 Type 2 diabetes mellitus with hyperglycemia: Secondary | ICD-10-CM | POA: Diagnosis not present

## 2019-10-05 DIAGNOSIS — E7800 Pure hypercholesterolemia, unspecified: Secondary | ICD-10-CM | POA: Diagnosis not present

## 2019-10-09 ENCOUNTER — Other Ambulatory Visit: Payer: Self-pay | Admitting: Neurology

## 2019-10-09 DIAGNOSIS — E890 Postprocedural hypothyroidism: Secondary | ICD-10-CM | POA: Diagnosis not present

## 2019-10-09 DIAGNOSIS — E1165 Type 2 diabetes mellitus with hyperglycemia: Secondary | ICD-10-CM | POA: Diagnosis not present

## 2019-10-09 DIAGNOSIS — I10 Essential (primary) hypertension: Secondary | ICD-10-CM | POA: Diagnosis not present

## 2019-10-09 DIAGNOSIS — E7800 Pure hypercholesterolemia, unspecified: Secondary | ICD-10-CM | POA: Diagnosis not present

## 2019-10-09 DIAGNOSIS — E291 Testicular hypofunction: Secondary | ICD-10-CM | POA: Diagnosis not present

## 2019-11-01 ENCOUNTER — Other Ambulatory Visit: Payer: Self-pay | Admitting: Neurology

## 2019-11-02 ENCOUNTER — Other Ambulatory Visit: Payer: Self-pay | Admitting: Neurology

## 2019-11-03 NOTE — Telephone Encounter (Signed)
error 

## 2019-11-20 DIAGNOSIS — R05 Cough: Secondary | ICD-10-CM | POA: Diagnosis not present

## 2019-11-20 DIAGNOSIS — J029 Acute pharyngitis, unspecified: Secondary | ICD-10-CM | POA: Diagnosis not present

## 2019-11-20 DIAGNOSIS — J0100 Acute maxillary sinusitis, unspecified: Secondary | ICD-10-CM | POA: Diagnosis not present

## 2019-11-20 DIAGNOSIS — R0989 Other specified symptoms and signs involving the circulatory and respiratory systems: Secondary | ICD-10-CM | POA: Diagnosis not present

## 2019-11-27 ENCOUNTER — Encounter: Payer: Self-pay | Admitting: Neurology

## 2019-11-30 ENCOUNTER — Encounter: Payer: Self-pay | Admitting: Neurology

## 2019-11-30 ENCOUNTER — Ambulatory Visit (INDEPENDENT_AMBULATORY_CARE_PROVIDER_SITE_OTHER): Payer: Medicare Other | Admitting: Neurology

## 2019-11-30 VITALS — BP 146/85 | HR 63 | Ht 70.0 in | Wt 205.5 lb

## 2019-11-30 DIAGNOSIS — G43109 Migraine with aura, not intractable, without status migrainosus: Secondary | ICD-10-CM | POA: Diagnosis not present

## 2019-11-30 DIAGNOSIS — Z79891 Long term (current) use of opiate analgesic: Secondary | ICD-10-CM

## 2019-11-30 DIAGNOSIS — M545 Low back pain: Secondary | ICD-10-CM

## 2019-11-30 DIAGNOSIS — E890 Postprocedural hypothyroidism: Secondary | ICD-10-CM | POA: Diagnosis not present

## 2019-11-30 DIAGNOSIS — E7800 Pure hypercholesterolemia, unspecified: Secondary | ICD-10-CM | POA: Diagnosis not present

## 2019-11-30 DIAGNOSIS — G7080 Lambert-Eaton syndrome, unspecified: Secondary | ICD-10-CM

## 2019-11-30 DIAGNOSIS — G8929 Other chronic pain: Secondary | ICD-10-CM

## 2019-11-30 HISTORY — DX: Migraine with aura, not intractable, without status migrainosus: G43.109

## 2019-11-30 MED ORDER — TOPIRAMATE 25 MG PO TABS: ORAL_TABLET | ORAL | 3 refills | Status: DC

## 2019-11-30 MED ORDER — GABAPENTIN 300 MG PO CAPS
ORAL_CAPSULE | ORAL | 3 refills | Status: DC
Start: 2019-11-30 — End: 2020-06-03

## 2019-11-30 MED ORDER — ZOLPIDEM TARTRATE 10 MG PO TABS
10.0000 mg | ORAL_TABLET | Freq: Every evening | ORAL | 2 refills | Status: DC | PRN
Start: 2019-11-30 — End: 2020-03-04

## 2019-11-30 MED ORDER — DULOXETINE HCL 30 MG PO CPEP
60.0000 mg | ORAL_CAPSULE | Freq: Every day | ORAL | 3 refills | Status: DC
Start: 2019-11-30 — End: 2020-11-26

## 2019-11-30 NOTE — Patient Instructions (Signed)
We will start topamax for the headache.  Topamax (topiramate) is a seizure medication that has an FDA approval for seizures and for migraine headache. Potential side effects of this medication include weight loss, cognitive slowing, tingling in the fingers and toes, and carbonated drinks will taste bad. If any significant side effects are noted on this drug, please contact our office.

## 2019-11-30 NOTE — Progress Notes (Signed)
Reason for visit: [name] is an 59 y.o. male  History of present illness:  Mr. [name] is a 59 year old right-handed white male with a history of [name].  He is on Ruzurgi through [employer], he takes 30 mg in the morning and seems to do well with this.  He takes Mestinon 60 mg 3 times daily.  He continues to have some neck and low back pain, he takes hydrocodone if needed.  He will be seeing a chiropractor in the near future.  He is having some crepitus in the neck.  In the last couple months he has noted onset of bright lights that occur in the vision lasting 2 to 30 minutes that may come and go sometimes associated with a dull retro-orbital headache.  This has been a daily occurrence, he has photophobia associated with this but no nausea or vomiting.  The patient is heat sensitive with the [name], the hot summer months seem to be fatiguing for him.  He otherwise has been doing fairly well.  He returns for an evaluation.  Past Medical History:  Diagnosis Date  . Anomalous coronary artery origin    The patient underwent cardiac catheterization in 2004.  Coronaries revealed no stenosis.  However he had aberrant takeoff of both the right and left coronaries from the anterior aortic cusp.  There was no evidence of renal artery stenosis  . Arthritis   . Back pain   . Broken fibula    right  . Depression   . Diabetes mellitus without complication ([place])   . Dyslipidemia   . Dyslipidemia   . Ejection fraction    EF 60-65%, echo, March 18, 2011  . H/O radioactive iodine thyroid ablation   . History of plasmapheresis   . Hypertension   . Hyperthyroidism    November, 201 to  . 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 and Dr. Floyde Parkins  . [name] ([place])   . Murmur    Systolic murmur, December, 2012,No valvular abnormalities, echo, December, 2012  . Obesity   . Pre-syncope     February 23, 2011  . QT interval    Question prolongation QT interval, October, 2012, review of EKG dated February 04, 2011, QT interval was not prolonged    Past Surgical History:  Procedure Laterality Date  . KNEE SURGERY Right   . PORT-A-CATH REMOVAL N/A 11/12/2017   Procedure: REMOVAL PORT-A-CATH;  Surgeon: Clovis Riley, MD;  Location: [place];  Service: General;  Laterality: N/A;  . PORTACATH PLACEMENT    . TONSILLECTOMY      Family History  Problem Relation Age of Onset  . Hypertension Mother   . Cirrhosis Father   . Cirrhosis Maternal Grandmother        non alcoholic  . Heart disease Other   . Stroke Other   . Cancer Other     Social history:  reports that he has never smoked. He has never used smokeless tobacco. He reports current alcohol use. He reports that he does not use drugs.    Allergies  Allergen Reactions  . Statins Other (See Comments)    MYALGIAS  . Rituximab     UNSPECIFIED REACTION   . Toradol [Ketorolac Tromethamine]     UNSPECIFIED REACTION   . Vioxx [Rofecoxib]     UNSPECIFIED REACTION     Medications:  Prior to Admission medications   Medication Sig Start Date End  Date Taking? Authorizing Provider  ALPRAZolam Duanne Moron) 0.5 MG tablet TAKE (1) TABLET THREE TIMES DAILY AS NEEDED. 11/01/19  Yes Suzzanne Cloud, NP  Coenzyme Q10 (CO Q-10) 200 MG CAPS Take 200 mg by mouth daily.   Yes [provider]  DULoxetine (CYMBALTA) 30 MG capsule Take 2 capsules (60 mg total) by mouth daily. 05/17/19  Yes Suzzanne Cloud, NP  ezetimibe (ZETIA) 10 MG tablet Take 10 mg by mouth daily. 05/05/19  Yes [provider]  FARXIGA 10 MG TABS tablet Take 10 mg by mouth daily. 11/02/19  Yes [provider]  gabapentin (NEURONTIN) 300 MG capsule TAKE  (1)  CAPSULE  TWICE DAILY. 05/17/19  Yes Suzzanne Cloud, NP  HYDROcodone-acetaminophen (NORCO/VICODIN) 5-325 MG tablet TAKE (1) TABLET EVERY SIX HOURS AS NEEDED. MUST LAST 28 DAYS 11/06/19  Yes Kathrynn Ducking, MD  levothyroxine (SYNTHROID) 137 MCG tablet Take 137 mcg by mouth daily. 05/05/19  Yes [provider]  lisinopril (PRINIVIL,ZESTRIL) 5 MG tablet TAKE 1 TABLET (5 MG TOTAL) BY MOUTH DAILY.   Yes Lysbeth Penner, FNP  Multiple Vitamin (MULTIVITAMIN WITH MINERALS) TABS tablet Take 1 tablet by mouth daily. One-A-Day   Yes [provider]  Omega-3 Fatty Acids (FISH OIL) 1200 MG CAPS Take 1,200 mg by mouth 3 (three) times daily.   Yes [provider]  pyridostigmine (MESTINON) 60 MG tablet TAKE  (1)  TABLET  THREE TIMES DAILY. 02/10/18  Yes Kathrynn Ducking, MD  rosuvastatin (CRESTOR) 5 MG tablet SMARTSIG:0.5 Tablet(s) By Mouth 3 Times a Week 11/02/19  Yes [provider]  RUZURGI 10 MG TABS 20 mg daily 10/17/18  Yes [provider]  zolpidem (AMBIEN) 10 MG tablet TAKE 1 TABLET AT BEDTIME AS NEEDED FOR SLEEP 10/03/19  Yes Suzzanne Cloud, NP    ROS:  Out of a complete 14 system review of symptoms, the patient complains only of the following symptoms, and all other reviewed systems are negative.  Headache Neck, back pain  Blood pressure (!) 146/85, pulse 63, height 5\' 10"  (1.778 m), weight 205 lb 8 oz (93.2 kg).  Physical Exam  General: The patient is alert and cooperative at the time of the examination.  Skin: No significant peripheral edema is noted.   Neurologic Exam  Mental status: The patient is alert and oriented x 3 at the time of the examination. The patient has apparent normal recent and remote memory, with an apparently normal attention span and concentration ability.   Cranial nerves: Facial symmetry is present. Speech is normal, no aphasia or dysarthria is noted. Extraocular movements are full. Visual fields are full.  Motor: The patient has good strength in all 4 extremities.  Sensory examination: Soft touch sensation is symmetric on the face, arms, and legs.  Coordination: The patient has good finger-nose-finger and  heel-to-shin bilaterally.  Gait and station: The patient has a normal gait. Tandem gait is normal. Romberg is negative. No drift is seen.  Reflexes: Deep tendon reflexes are symmetric, but are depressed throughout.   Assessment/Plan:  1.  [name]  2.  Probable migraine headache with ocular features  3.  Chronic low back pain and neck pain  The patient will be given a trial on Topamax for the headache issues, he will be given a prescription for his Ambien, gabapentin, and Cymbalta.  He will follow-up here in 6 months, he will call for any dose adjustments of the medication.  He will have a urine  drug screen.  The patient has done well in controlling his diabetes, he has had some weight loss with his diet.  Jill Alexanders MD 11/30/2019 10:23 AM  [facility] [address] [place],  [address]  Phone [phone] Fax [phone]

## 2019-12-01 ENCOUNTER — Other Ambulatory Visit: Payer: Self-pay | Admitting: Neurology

## 2019-12-04 LAB — COMPLIANCE DRUG ANALYSIS, UR

## 2020-01-02 ENCOUNTER — Other Ambulatory Visit: Payer: Self-pay | Admitting: Neurology

## 2020-01-31 ENCOUNTER — Other Ambulatory Visit: Payer: Self-pay | Admitting: Neurology

## 2020-03-01 ENCOUNTER — Other Ambulatory Visit: Payer: Self-pay | Admitting: Neurology

## 2020-03-05 ENCOUNTER — Other Ambulatory Visit: Payer: Self-pay | Admitting: Neurology

## 2020-03-30 ENCOUNTER — Other Ambulatory Visit: Payer: Self-pay | Admitting: Neurology

## 2020-04-18 DIAGNOSIS — E890 Postprocedural hypothyroidism: Secondary | ICD-10-CM | POA: Diagnosis not present

## 2020-04-18 DIAGNOSIS — E7800 Pure hypercholesterolemia, unspecified: Secondary | ICD-10-CM | POA: Diagnosis not present

## 2020-04-18 DIAGNOSIS — E1165 Type 2 diabetes mellitus with hyperglycemia: Secondary | ICD-10-CM | POA: Diagnosis not present

## 2020-04-18 DIAGNOSIS — I10 Essential (primary) hypertension: Secondary | ICD-10-CM | POA: Diagnosis not present

## 2020-04-22 DIAGNOSIS — I10 Essential (primary) hypertension: Secondary | ICD-10-CM | POA: Diagnosis not present

## 2020-04-22 DIAGNOSIS — E890 Postprocedural hypothyroidism: Secondary | ICD-10-CM | POA: Diagnosis not present

## 2020-04-22 DIAGNOSIS — E7800 Pure hypercholesterolemia, unspecified: Secondary | ICD-10-CM | POA: Diagnosis not present

## 2020-04-22 DIAGNOSIS — E291 Testicular hypofunction: Secondary | ICD-10-CM | POA: Diagnosis not present

## 2020-04-22 DIAGNOSIS — E1165 Type 2 diabetes mellitus with hyperglycemia: Secondary | ICD-10-CM | POA: Diagnosis not present

## 2020-04-30 ENCOUNTER — Other Ambulatory Visit: Payer: Self-pay | Admitting: Neurology

## 2020-05-17 DIAGNOSIS — H579 Unspecified disorder of eye and adnexa: Secondary | ICD-10-CM | POA: Diagnosis not present

## 2020-05-17 DIAGNOSIS — H04123 Dry eye syndrome of bilateral lacrimal glands: Secondary | ICD-10-CM | POA: Diagnosis not present

## 2020-05-17 DIAGNOSIS — R682 Dry mouth, unspecified: Secondary | ICD-10-CM | POA: Diagnosis not present

## 2020-05-17 DIAGNOSIS — R202 Paresthesia of skin: Secondary | ICD-10-CM | POA: Diagnosis not present

## 2020-05-17 DIAGNOSIS — G7080 Lambert-Eaton syndrome, unspecified: Secondary | ICD-10-CM | POA: Diagnosis not present

## 2020-05-17 DIAGNOSIS — E0840 Diabetes mellitus due to underlying condition with diabetic neuropathy, unspecified: Secondary | ICD-10-CM | POA: Diagnosis not present

## 2020-05-28 ENCOUNTER — Other Ambulatory Visit: Payer: Self-pay | Admitting: Neurology

## 2020-05-30 ENCOUNTER — Other Ambulatory Visit: Payer: Self-pay | Admitting: Neurology

## 2020-06-03 ENCOUNTER — Other Ambulatory Visit: Payer: Self-pay

## 2020-06-03 ENCOUNTER — Encounter: Payer: Self-pay | Admitting: Neurology

## 2020-06-03 ENCOUNTER — Ambulatory Visit (INDEPENDENT_AMBULATORY_CARE_PROVIDER_SITE_OTHER): Payer: Medicare Other | Admitting: Neurology

## 2020-06-03 VITALS — BP 138/72 | HR 55 | Ht 70.0 in | Wt 209.0 lb

## 2020-06-03 DIAGNOSIS — G43109 Migraine with aura, not intractable, without status migrainosus: Secondary | ICD-10-CM

## 2020-06-03 DIAGNOSIS — G7080 Lambert-Eaton syndrome, unspecified: Secondary | ICD-10-CM | POA: Diagnosis not present

## 2020-06-03 MED ORDER — TOPIRAMATE 100 MG PO TABS
100.0000 mg | ORAL_TABLET | Freq: Every evening | ORAL | 3 refills | Status: DC
Start: 2020-06-03 — End: 2020-08-19

## 2020-06-03 NOTE — Progress Notes (Signed)
PATIENT: [name] DOB: March 11, 1961  REASON FOR VISIT: follow up HISTORY FROM: patient  HISTORY OF PRESENT ILLNESS: Today 06/03/20 [name] is a 60 year old male with history of [name] Nicholson.  He is prescribed Xanax, Cymbalta, gabapentin, hydrocodone, Mestinon, Topamax, and Ambien from this office.   He follows with [facility], was recently seen, following contracting Covid for the second time in Jan 2022, experiencing sensory symptoms consistent with neuropathy, also having worsening of his motor and autonomic symptoms from [name].  Having early morning joint pain.  He is going to be referred to rheumatology (concerend for Sjorgen's, dry mouth, blisters), initiate treatment with rituximab.   Seeing Rheumatology July, earliest appointment.   Seeing opthmalogy, chronic headaches, has pressure in bilateral parietal areas, start seeing halos, bring circles across eyes, shut his eyes to rest, happens everyday, several times a day. April 6th eye doctor. Topamax has helped the headaches. Not much help for the visual symptoms.   Depression is well controlled, taking Cymbalta 60 mg daily.  ANA was positive at [facility].   A few falls since COVID. Gets fatigued easily. Pins and needs in feet.   Is working Research scientist (physical sciences). Is married, has child who transitioned to male, a lot of home stress.  Takes Xanax PRN for anxiety, mostly at end of the day. Hard for him to get up and going, had a work change to start at 10 am.   On Hydrocodone PRN for back pain. Off gabapentin. When on gabapentin and Cymbalta, having brain fog.   HISTORY 11/30/2019 [name]: [name] is a 61 year old right-handed white male with a history of [name].  He is on Ruzurgi through [employer], he takes 30 mg in the morning and seems to do well with this.  He takes Mestinon 60 mg 3 times daily.  He continues to have some neck and low back pain, he takes  hydrocodone if needed.  He will be seeing a chiropractor in the near future.  He is having some crepitus in the neck.  In the last couple months he has noted onset of bright lights that occur in the vision lasting 2 to 30 minutes that may come and go sometimes associated with a dull retro-orbital headache.  This has been a daily occurrence, he has photophobia associated with this but no nausea or vomiting. The patient is heat sensitive with the [name], the hot summer months seem to be fatiguing for him.  He otherwise has been doing fairly well.  He returns for an evaluation.   REVIEW OF SYSTEMS: Out of a complete 14 system review of symptoms, the patient complains only of the following symptoms, and all other reviewed systems are negative.  See HPI  ALLERGIES: Allergies  Allergen Reactions  . Statins Other (See Comments)    MYALGIAS  . Rituximab     UNSPECIFIED REACTION   . Toradol [Ketorolac Tromethamine]     UNSPECIFIED REACTION   . Vioxx [Rofecoxib]     UNSPECIFIED REACTION     HOME MEDICATIONS: Outpatient Medications Prior to Visit  Medication Sig Dispense Refill  . ALPRAZolam (XANAX) 0.5 MG tablet TAKE (1) TABLET THREE TIMES DAILY AS NEEDED. 90 tablet 1  . Coenzyme Q10 (CO Q-10) 200 MG CAPS Take 200 mg by mouth daily.    . DULoxetine (CYMBALTA) 30 MG capsule Take 2 capsules (60 mg total) by mouth daily. 180 capsule 3  . ezetimibe (ZETIA) 10 MG tablet Take 10  mg by mouth daily.    Marland Kitchen FARXIGA 10 MG TABS tablet Take 10 mg by mouth daily.    Marland Kitchen HYDROcodone-acetaminophen (NORCO/VICODIN) 5-325 MG tablet TAKE (1) TABLET EVERY SIX HOURS AS NEEDED. MUST LAST 28 DAYS 60 tablet 0  . levothyroxine (SYNTHROID) 137 MCG tablet Take 137 mcg by mouth daily.    Marland Kitchen lisinopril (PRINIVIL,ZESTRIL) 5 MG tablet TAKE 1 TABLET (5 MG TOTAL) BY MOUTH DAILY. 30 tablet 0  . Multiple Vitamin (MULTIVITAMIN WITH MINERALS) TABS tablet Take 1 tablet by mouth daily. One-A-Day    . Omega-3 Fatty Acids  (FISH OIL) 1200 MG CAPS Take 1,200 mg by mouth 3 (three) times daily.    Marland Kitchen pyridostigmine (MESTINON) 60 MG tablet TAKE  (1)  TABLET  THREE TIMES DAILY. 90 tablet 0  . rosuvastatin (CRESTOR) 5 MG tablet SMARTSIG:0.5 Tablet(s) By Mouth 3 Times a Week    . RUZURGI 10 MG TABS 20 mg daily    . zolpidem (AMBIEN) 10 MG tablet TAKE 1 TABLET AT BEDTIME AS NEEDED FOR SLEEP 30 tablet 1  . topiramate (TOPAMAX) 25 MG tablet TAPER UP TO 3 TABLETS AT BEDTIME. (INCREASE BY 1 EACH WEEK AS DIRECTED) 90 tablet 0  . gabapentin (NEURONTIN) 300 MG capsule TAKE  (1)  CAPSULE  TWICE DAILY. 180 capsule 3   No facility-administered medications prior to visit.    PAST MEDICAL HISTORY: Past Medical History:  Diagnosis Date  . Anomalous coronary artery origin    The patient underwent cardiac catheterization in 2004.  Coronaries revealed no stenosis.  However he had aberrant takeoff of both the right and left coronaries from the anterior aortic cusp.  There was no evidence of renal artery stenosis  . Arthritis   . Back pain   . Broken fibula    right  . Depression   . Diabetes mellitus without complication ([place])   . Dyslipidemia   . Dyslipidemia   . Ejection fraction    EF 60-65%, echo, March 18, 2011  . H/O radioactive iodine thyroid ablation   . History of plasmapheresis   . Hypertension   . Hyperthyroidism    November, 201 to  . Matthew myasthenic Nicholson ([name])    Followed at [facility] and [name]  . [name] ([name])   . Murmur    Systolic murmur, December, 2012,No valvular abnormalities, echo, December, 2012  . Obesity   . Ocular migraine 11/30/2019  . Pre-syncope    February 23, 2011  . QT interval    Question prolongation QT interval, October, 2012, review of EKG dated February 04, 2011, QT interval was not prolonged    PAST SURGICAL HISTORY: Past Surgical History:  Procedure Laterality Date  . KNEE SURGERY Right   . PORT-A-CATH REMOVAL N/A 11/12/2017   Procedure:  REMOVAL PORT-A-CATH;  Surgeon: Matthew Riley, MD;  Location: [name];  Service: General;  Laterality: N/A;  . PORTACATH PLACEMENT    . TONSILLECTOMY      FAMILY HISTORY: Family History  Problem Relation Age of Onset  . Hypertension Mother   . Cirrhosis Father   . Cirrhosis Maternal Grandmother        non alcoholic  . Heart disease Other   . Stroke Other   . Cancer Other     SOCIAL HISTORY: Social History   Socioeconomic History  . Marital status: Married    Spouse name: Caren Griffins  . Number of children: 2  . Years of education: 5  . Highest education level:  Not on file  Occupational History  . Occupation: Freight forwarder    Employer: [employer]  Tobacco Use  . Smoking status: Never Smoker  . Smokeless tobacco: Never Used  Substance and Sexual Activity  . Alcohol use: Yes    Alcohol/week: 0.0 standard drinks    Comment: social  . Drug use: No  . Sexual activity: Yes  Other Topics Concern  . Not on file  Social History Narrative   Patient lives at home with his wife Caren Griffins).   Disabled.   Education high school.   Right handed.   Caffeine None .   Social Determinants of Health   Financial Resource Strain: Not on file  Food Insecurity: Not on file  Transportation Needs: Not on file  Physical Activity: Not on file  Stress: Not on file  Social Connections: Not on file  Intimate Partner Violence: Not on file   PHYSICAL EXAM  Vitals:   06/03/20 0834  BP: 138/72  Pulse: (!) 55  Weight: 209 lb (94.8 kg)  Height: 5\' 10"  (1.778 m)   Body mass index is 29.99 kg/m.  Generalized: Well developed, in no acute distress   Neurological examination  Mentation: Alert oriented to time, place, history taking. Follows all commands speech and language fluent Cranial nerve II-XII: Pupils were equal round reactive to light. Extraocular movements were full, visual field were full on confrontational test. Facial sensation and strength were normal. Head  turning and shoulder shrug  were normal and symmetric. Motor: The motor testing reveals 5 over 5 strength of all 4 extremities. Good symmetric motor tone is noted throughout.  Sensory: Decreased pinprick to mid foot bilaterally, decreased vibration sensation up to ankle level bilaterally, soft touch is intact Coordination: Cerebellar testing reveals good finger-nose-finger and heel-to-shin bilaterally.  Gait and station: Gait is stiff, slow, but is steady and intact.  Reflexes: Deep tendon reflexes are symmetric but decreased throughout  DIAGNOSTIC DATA (LABS, IMAGING, TESTING) - I reviewed patient records, labs, notes, testing and imaging myself where available.  Lab Results  Component Value Date   WBC 5.9 11/10/2017   HGB 15.3 11/10/2017   HCT 44.9 11/10/2017   MCV 89.4 11/10/2017   PLT 152 11/10/2017      Component Value Date/Time   NA 140 11/10/2017 0905   NA 137 02/28/2013 0927   K 3.8 11/10/2017 0905   CL 104 11/10/2017 0905   CO2 27 11/10/2017 0905   GLUCOSE 91 11/10/2017 0905   BUN 11 11/10/2017 0905   BUN 12 02/28/2013 0927   CREATININE 0.94 11/10/2017 0905   CALCIUM 9.5 11/10/2017 0905   PROT 6.9 02/28/2013 0927   ALBUMIN 4.5 02/28/2013 0927   AST 55 (H) 02/28/2013 0927   ALT 45 (H) 02/28/2013 0927   ALKPHOS 77 02/28/2013 0927   BILITOT 0.5 02/28/2013 0927   GFRNONAA >60 11/10/2017 0905   GFRAA >60 11/10/2017 0905   Lab Results  Component Value Date   CHOL 269 (H) 02/28/2013   HDL 35 (L) 02/28/2013   LDLCALC 173 (H) 02/28/2013   TRIG 306 (H) 02/28/2013   CHOLHDL 7.7 (H) 02/28/2013   Lab Results  Component Value Date   HGBA1C 7.3% 02/28/2013   No results found for: VITAMINB12 Lab Results  Component Value Date   TSH 7.520 (H) 02/28/2013   ASSESSMENT AND PLAN 60 y.o. year old male  has a past medical history of Anomalous coronary artery origin, Arthritis, Back pain, Broken fibula, Depression, Diabetes mellitus without complication (  [place]), Dyslipidemia,  Dyslipidemia, Ejection fraction, H/O radioactive iodine thyroid ablation, History of plasmapheresis, Hypertension, Hyperthyroidism, Matthew myasthenic Nicholson ([place]), [name] ([place]), Murmur, Obesity, Ocular migraine (11/30/2019), Pre-syncope, and QT interval. here with:  1.  [name] Nicholson 2.  Probable migraine headache with ocular features 3.  Chronic low back pain and neck pain  -Recently saw Dr. Nanine [facility] at [facility], his condition has worsened following Covid infection in January 2022, planning to restart Rituxan  -Currently taking Ruzurgi, but will no longer be manufactured, will have to switch to firdapse, he is not thrilled about this, has been on before without much benefit  -Has been referred to rheumatology, positive ANA, concern for Sjogren's, has a lot of morning stiffness, dry mouth, but appointment is not until July, will talk with [facility], see if we can refer in [facility] for earlier appointment  -Refer to ophthalmology due to halo report in vision, migraines are better controlled with Topamax, will increase to 100 mg at bedtime, see if any benefit to ocular features  -A lot of personal, work stress going on   -We will continue current medications, Xanax, Cymbalta, hydrocodone, is no longer on gabapentin  -Due to change in condition will have him follow-up in 4 months or sooner if needed with [name]  Plan: (Copied [facility] Note 05/17/2020 Dr. Queen Slough)  Raise the dose of his duloxetine to 60 mg daily total dose (can either take 2 tabs of 20 mg in the a.m. with 1 tab of 20 mg in p.m. or vice versa). Will leave the Topamax as it is for now.  Small fiber neuropathy work-up as outlined below   Given the worsening of his [name], will initiate treatment with Rituximab + PREMEDICATION WITH SOLUMEDROL. It should be noted that in the past MatthewTolan had a rash with anaphylaxis with rituximab, which resolved after he was premedicated  with Soulmedrol.  Continue Mestinon twice daily and Ruzurgi 4 tabs twice daily  Referral to rheumatology with pertinent initial lab work-up as outlined below  Referral to ophthalmology to assess visual halos   I spent 30 minutes of face-to-face and non-face-to-face time with patient.  This included previsit chart review, lab review, study review, order entry, electronic health record documentation, patient education.   Butler Denmark, AGNP-C, DNP 06/03/2020, 9:21 AM [facility] [address], [place],  [address] [phone]

## 2020-06-03 NOTE — Patient Instructions (Signed)
Increase the Topamax 100 mg at bedtime to help with headaches If you want to go to a [facility] specialist  For now continue other medications See you back in 4 months

## 2020-06-04 ENCOUNTER — Encounter: Payer: Self-pay | Admitting: Neurology

## 2020-06-04 DIAGNOSIS — G7080 Lambert-Eaton syndrome, unspecified: Secondary | ICD-10-CM

## 2020-06-04 DIAGNOSIS — G43109 Migraine with aura, not intractable, without status migrainosus: Secondary | ICD-10-CM

## 2020-06-04 NOTE — Progress Notes (Signed)
I have read the note, and I agree with the clinical assessment and plan.  Charles K Willis   

## 2020-07-02 ENCOUNTER — Other Ambulatory Visit: Payer: Self-pay | Admitting: Neurology

## 2020-07-09 DIAGNOSIS — E119 Type 2 diabetes mellitus without complications: Secondary | ICD-10-CM | POA: Diagnosis not present

## 2020-07-09 DIAGNOSIS — G43109 Migraine with aura, not intractable, without status migrainosus: Secondary | ICD-10-CM | POA: Diagnosis not present

## 2020-07-09 DIAGNOSIS — H04123 Dry eye syndrome of bilateral lacrimal glands: Secondary | ICD-10-CM | POA: Diagnosis not present

## 2020-07-09 DIAGNOSIS — H2513 Age-related nuclear cataract, bilateral: Secondary | ICD-10-CM | POA: Diagnosis not present

## 2020-07-16 ENCOUNTER — Other Ambulatory Visit: Payer: Self-pay | Admitting: Neurology

## 2020-07-29 ENCOUNTER — Other Ambulatory Visit: Payer: Self-pay | Admitting: Neurology

## 2020-07-30 ENCOUNTER — Other Ambulatory Visit: Payer: Self-pay | Admitting: Neurology

## 2020-08-01 ENCOUNTER — Encounter: Payer: Self-pay | Admitting: Neurology

## 2020-08-01 DIAGNOSIS — G4719 Other hypersomnia: Secondary | ICD-10-CM | POA: Diagnosis not present

## 2020-08-01 DIAGNOSIS — G7080 Lambert-Eaton syndrome, unspecified: Secondary | ICD-10-CM | POA: Diagnosis not present

## 2020-08-05 NOTE — Progress Notes (Signed)
Office Visit Note  Patient: [name]             Date of Birth: 24-Jan-1961           MRN: [id number]             PCP: Vernie Shanks, MD Referring: Jacelyn Pi, MD Visit Date: 08/19/2020 Occupation: @GUAROCC @  Subjective:  Rash and joint pain.   History of Present Illness: [name] is a 60 y.o. male seen in consultation per request of Dr. Michiel Sites for evaluation of positive ANA.  According to the patient 1 year ago he developed rash around his neck and his mouth.  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  He was seen at [facility] clinic where they also noted the rash on his face and neck.  He complained about dry mouth symptoms which has been going on for 1 year.  He also gives history of oral ulcers off and on for last 1 year.  He related oral ulcers to his medications.  The labs done at [facility] clinic showed positive ANA and for that reason he was advised a rheumatology referral.  He states he has been also experiencing increased stiffness in his joints especially his hands, feet and right knee over the last 1 year.  He has not noticed any joint swelling.  He gives history of heat intolerance which he describes as increased sweating when he is exposed to the hot weather.  He has not seen a dermatologist yet.  He has been followed by Dr. Juleen China and at [facility] for [name].  He states he has noticed increased weakness in his upper extremities.  He gives history of oral ulcers, dry mouth, dry eyes, Raynaud's phenomenon, photosensitivity.  Patient states he developed similar symptoms in 1993 at that time Dr. Kristen Loader diagnosed him with systemic lupus and treated him with hydroxychloroquine for 1 year before he was diagnosed with [name] and he was taken off the Plaquenil.  He believes his sister may have autoimmune disease but he does not know the details.  Activities of  Daily Living:  Patient reports morning stiffness for 30-45  minutes.   Patient Reports nocturnal pain.  Difficulty dressing/grooming: Denies Difficulty climbing stairs: Reports Difficulty getting out of chair: Reports Difficulty using hands for taps, buttons, cutlery, and/or writing: Denies  Review of Systems  Constitutional: Positive for fatigue. Negative for night sweats.  HENT: Positive for mouth sores, mouth dryness, nose dryness and sore tongue.   Eyes: Positive for dryness. Negative for pain, redness and itching.  Respiratory: Negative for shortness of breath and difficulty breathing.   Cardiovascular: Negative for chest pain, palpitations, hypertension, irregular heartbeat and swelling in legs/feet.  Gastrointestinal: Positive for constipation and diarrhea. Negative for blood in stool.  Endocrine: Positive for heat intolerance and increased urination.  Genitourinary: Negative for difficulty urinating.  Musculoskeletal: Positive for arthralgias, joint pain, myalgias, muscle weakness, morning stiffness, muscle tenderness and myalgias. Negative for joint swelling.  Skin: Positive for color change, rash and sensitivity to sunlight. Negative for hair loss, nodules/bumps, redness, skin tightness and ulcers.  Allergic/Immunologic: Positive for susceptible to infections.  Neurological: Positive for numbness, headaches and weakness. Negative for dizziness, fainting, memory loss and night sweats.  Hematological: Negative for bruising/bleeding tendency and swollen glands.  Psychiatric/Behavioral: Positive for depressed mood and sleep disturbance. Negative for confusion. The patient is not nervous/anxious.  PMFS History:  Patient Active Problem List   Diagnosis Date Noted  . Ocular migraine 11/30/2019  . Diabetes ([place]) 02/28/2013  . Disturbance in sleep behavior 06/17/2011  . Encounter for therapeutic drug monitoring 06/17/2011  . Myasthenic syndromes in diseases classified elsewhere  06/17/2011  . Murmur   . Ejection fraction   . Depression   . Back pain   . 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 QT interval   . Hyperthyroidism   . Pre-syncope   . Anomalous coronary artery origin     Past Medical History:  Diagnosis Date  . Anomalous coronary artery origin    The patient underwent cardiac catheterization in 2004.  Coronaries revealed no stenosis.  However he had aberrant takeoff of both the right and left coronaries from the anterior aortic cusp.  There was no evidence of renal artery stenosis  . Arthritis   . Back pain   . Broken fibula    right  . Depression   . Diabetes mellitus without complication ([place])   . Dyslipidemia   . Dyslipidemia   . Ejection fraction    EF 60-65%, echo, March 18, 2011  . H/O radioactive iodine thyroid ablation   . History of plasmapheresis   . Hypertension   . Hyperthyroidism    November, 201 to  . 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 and Dr. Floyde Parkins  . 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 Murmur    Systolic murmur, December, 2012,No valvular abnormalities, echo, December, 2012  . Obesity   . Ocular migraine 11/30/2019  . Pre-syncope    February 23, 2011  . QT interval    Question prolongation QT interval, October, 2012, review of EKG dated February 04, 2011, QT interval was not prolonged    Family History  Problem Relation Age of Onset  . Hypertension Mother   . Fibromyalgia Mother   . Cirrhosis Father   . Autoimmune disease Sister   . Hypertension Brother   . Cirrhosis Maternal Grandmother        non alcoholic  . Heart disease Other   . Stroke Other   . Cancer Other   . Healthy Son   . Healthy Son    Past Surgical History:  Procedure Laterality Date  . KNEE SURGERY Right   . PORT-A-CATH REMOVAL N/A 11/12/2017   Procedure: REMOVAL PORT-A-CATH;  Surgeon: Clovis Riley, MD;  Location: [facility];  Service: General;  Laterality: N/A;  . PORTACATH PLACEMENT    . TONSILLECTOMY      Social History   Social History Narrative   Patient lives at home with his wife Caren Griffins).   Disabled.   Education high school.   Right handed.   Caffeine None .   Immunization History  Administered Date(s) Administered  . Moderna Sars-Covid-2 Vaccination 04/22/2019, 05/20/2019, 02/08/2020     Objective: Vital Signs: BP (!) 152/83 (BP Location: Right Arm, Patient Position: Sitting, Cuff Size: Normal)   Pulse (!) 53   Resp 17   Ht 5\' 10"  (1.778 m)   Wt 213 lb 6.4 oz (96.8 kg)   BMI 30.62 kg/m    Physical Exam Vitals and nursing note reviewed.  Constitutional:      Appearance: He is well-developed.  HENT:     Head: Normocephalic and atraumatic.  Eyes:     Conjunctiva/sclera: Conjunctivae normal.     Pupils: Pupils are equal, round, and reactive to light.  Cardiovascular:     Rate and  Rhythm: Normal rate and regular rhythm.     Heart sounds: Normal heart sounds.  Pulmonary:     Effort: Pulmonary effort is normal.     Breath sounds: Normal breath sounds.  Abdominal:     General: Bowel sounds are normal.     Palpations: Abdomen is soft.  Musculoskeletal:     Cervical back: Normal range of motion and neck supple.  Skin:    General: Skin is warm and dry.     Capillary Refill: Capillary refill takes less than 2 seconds.  Neurological:     Mental Status: He is alert and oriented to person, place, and time.  Psychiatric:        Behavior: Behavior normal.      Musculoskeletal Exam: C-spine was in good range of motion.  Shoulder joints, elbow joints, wrist joints, MCPs PIPs and DIPs with good range of motion.  He had some bilateral PIP and DIP thickening with no synovitis.  Hip joints and knee joints with good range of motion.  He does complain of discomfort with range of motion of his right knee joint without any warmth swelling effusion.  He has bilateral dorsal spurs and prominence of PIP and DIPs in his feet.  No synovitis was noted.  CDAI Exam: CDAI Score:  -- Patient Global: --; Provider Global: -- Swollen: --; Tender: -- Joint Exam 08/19/2020   No joint exam has been documented for this visit   There is currently no information documented on the homunculus. Go to the Rheumatology activity and complete the homunculus joint exam.  Investigation: No additional findings.  Imaging: XR Foot 2 Views Left  Result Date: 08/19/2020 PIP and DIP narrowing was noted.  Dorsal spurring was noted.  Inferior and posterior calcaneal spurs were noted.  No MTP or intertarsal joint space narrowing was noted.  No erosive changes were noted. Impression: These findings are consistent with osteoarthritis of the foot.  XR Foot 2 Views Right  Result Date: 08/19/2020 PIP and DIP narrowing was noted.  No MTP, intertarsal narrowing was noted.  Dorsal spurring was noted.  Inferior and posterior calcaneal spurs were noted.  No erosive changes were noted. Impression: These findings are consistent with osteoarthritis of the foot.  XR Hand 2 View Left  Result Date: 08/19/2020 CMC, PIP and DIP narrowing was noted.  No MCP, intercarpal or radiocarpal joint space narrowing was noted.  No erosive changes were noted. Impression: These findings are consistent with osteoarthritis of the hand.  XR Hand 2 View Right  Result Date: 08/19/2020 CMC, PIP and DIP narrowing was noted.  Partial amputation of right second distal phalanx was noted.  No MCP, intercarpal or radiocarpal joint space narrowing was noted.  No erosive changes were noted. Impression: These findings are consistent with osteoarthritis of the limb.  XR KNEE 3 VIEW RIGHT  Result Date: 08/19/2020 No medial or lateral compartment narrowing was noted.  No patellofemoral narrowing was noted.  No chondrocalcinosis was noted. Impression: Unremarkable x-ray of the knee.   Recent Labs: Lab Results  Component Value Date   WBC 5.9 11/10/2017   HGB 15.3 11/10/2017   PLT 152 11/10/2017   NA 140 11/10/2017   K 3.8 11/10/2017    CL 104 11/10/2017   CO2 27 11/10/2017   GLUCOSE 91 11/10/2017   BUN 11 11/10/2017   CREATININE 0.94 11/10/2017   BILITOT 0.5 02/28/2013   ALKPHOS 77 02/28/2013   AST 55 (H) 02/28/2013   ALT 45 (H) 02/28/2013  PROT 6.9 02/28/2013   ALBUMIN 4.5 02/28/2013   CALCIUM 9.5 11/10/2017   GFRAA >60 11/10/2017    Speciality Comments: No specialty comments available.  Procedures:  No procedures performed Allergies: Statins, Rituximab, Toradol [ketorolac tromethamine], and Vioxx [rofecoxib]   Assessment / Plan:     Visit Diagnoses: Positive ANA (antinuclear antibody) -patient was found to have positive ANA at [facility].  He was referred to me for further evaluation.  Patient states he was diagnosed with lupus in 1993 by Dr. Justine Null and was given Plaquenil 1 year.  Then his diagnosis was changed to [name] and he was taken off the Plaquenil.  He gives history of frequent rash on his face and neck and sometimes on his lower extremities.  He gives history of sicca symptoms and oral ulcers for the last 1 year.  He also gives history of mild Raynaud's symptoms.  He gives history of arthralgias but no synovitis.  05/17/20: ANA 1:160 speckled.  I will obtain AVISE labs.  Pain in both hands -he complains of pain and discomfort in the bilateral hands.  No synovitis was noted.  PIP and DIP thickening was noted in bilateral hands consistent with osteoarthritis.  Plan: XR Hand 2 View Right, XR Hand 2 View Left.  X-ray of bilateral hands were consistent with osteoarthritis.  Chronic pain of right knee -he complains of discomfort in his right knee joint.  No warmth swelling or effusion was noted.  Plan: XR KNEE 3 VIEW RIGHT.  X-ray of the knee joint was unremarkable.  Pain in both feet -he complains of pain and discomfort in his bilateral feet.  Dorsal spurring and PIP and DIP thickening consistent with osteoarthritis was noted.  No synovitis was noted.  Plan: XR Foot 2 Views Right, XR Foot 2  Views Left.  X-ray of bilateral feet were consistent with osteoarthritis.  Rash-he brought some pictures on his cell phone of the rash on his neck and on his face.  He currently did not have any rash.  I will make a referral to dermatology.  Sicca complex (HCC)-he complains of dry mouth and dry eyes over the last 1 year.  He is on multiple medications which could be contributing to sicca symptoms.  Raynaud's disease without gangrene-he complains of mild Raynaud's symptoms.  I do not see any digital cyanosis.  He had good capillary refill and no sclerodactyly.  On nailbed exam no nailbed capillary changes were noted.  Myalgia-he complains of muscle pain as well and muscle weakness.  He had multiple tender points and hyperalgesia that raises concern about possible myofascial pain.  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 Jannifer Franklin and at [facility].  Ocular migraine  Hyperthyroidism  History of depression  Orders: Orders Placed This Encounter  Procedures  . XR KNEE 3 VIEW RIGHT  . XR Hand 2 View Right  . XR Hand 2 View Left  . XR Foot 2 Views Right  . XR Foot 2 Views Left  . Ambulatory referral to Dermatology   No orders of the defined types were placed in this encounter.    Follow-Up Instructions: Return for +ANA.   Bo Merino, MD  Note - This record has been created using Editor, commissioning.  Chart creation errors have been sought, but may not always  have been located. Such creation errors do not reflect on  the standard of medical care.

## 2020-08-19 ENCOUNTER — Ambulatory Visit: Payer: Self-pay

## 2020-08-19 ENCOUNTER — Encounter: Payer: Self-pay | Admitting: Rheumatology

## 2020-08-19 ENCOUNTER — Ambulatory Visit (INDEPENDENT_AMBULATORY_CARE_PROVIDER_SITE_OTHER): Payer: Medicare Other | Admitting: Rheumatology

## 2020-08-19 ENCOUNTER — Other Ambulatory Visit: Payer: Self-pay

## 2020-08-19 VITALS — BP 152/83 | HR 53 | Resp 17 | Ht 70.0 in | Wt 213.4 lb

## 2020-08-19 DIAGNOSIS — Z789 Other specified health status: Secondary | ICD-10-CM

## 2020-08-19 DIAGNOSIS — E7800 Pure hypercholesterolemia, unspecified: Secondary | ICD-10-CM | POA: Diagnosis not present

## 2020-08-19 DIAGNOSIS — M3500 Sicca syndrome, unspecified: Secondary | ICD-10-CM

## 2020-08-19 DIAGNOSIS — G7080 Lambert-Eaton syndrome, unspecified: Secondary | ICD-10-CM | POA: Diagnosis not present

## 2020-08-19 DIAGNOSIS — M79641 Pain in right hand: Secondary | ICD-10-CM | POA: Diagnosis not present

## 2020-08-19 DIAGNOSIS — G43109 Migraine with aura, not intractable, without status migrainosus: Secondary | ICD-10-CM

## 2020-08-19 DIAGNOSIS — M79671 Pain in right foot: Secondary | ICD-10-CM

## 2020-08-19 DIAGNOSIS — R21 Rash and other nonspecific skin eruption: Secondary | ICD-10-CM | POA: Diagnosis not present

## 2020-08-19 DIAGNOSIS — M79642 Pain in left hand: Secondary | ICD-10-CM | POA: Diagnosis not present

## 2020-08-19 DIAGNOSIS — Z8639 Personal history of other endocrine, nutritional and metabolic disease: Secondary | ICD-10-CM

## 2020-08-19 DIAGNOSIS — R768 Other specified abnormal immunological findings in serum: Secondary | ICD-10-CM | POA: Diagnosis not present

## 2020-08-19 DIAGNOSIS — I7300 Raynaud's syndrome without gangrene: Secondary | ICD-10-CM | POA: Diagnosis not present

## 2020-08-19 DIAGNOSIS — G8929 Other chronic pain: Secondary | ICD-10-CM | POA: Diagnosis not present

## 2020-08-19 DIAGNOSIS — Z8659 Personal history of other mental and behavioral disorders: Secondary | ICD-10-CM

## 2020-08-19 DIAGNOSIS — Q245 Malformation of coronary vessels: Secondary | ICD-10-CM

## 2020-08-19 DIAGNOSIS — M791 Myalgia, unspecified site: Secondary | ICD-10-CM

## 2020-08-19 DIAGNOSIS — E0590 Thyrotoxicosis, unspecified without thyrotoxic crisis or storm: Secondary | ICD-10-CM

## 2020-08-19 DIAGNOSIS — M79672 Pain in left foot: Secondary | ICD-10-CM | POA: Diagnosis not present

## 2020-08-19 DIAGNOSIS — M25561 Pain in right knee: Secondary | ICD-10-CM | POA: Diagnosis not present

## 2020-08-28 ENCOUNTER — Encounter: Payer: Self-pay | Admitting: Neurology

## 2020-08-29 ENCOUNTER — Other Ambulatory Visit: Payer: Self-pay | Admitting: Neurology

## 2020-09-03 ENCOUNTER — Other Ambulatory Visit: Payer: Self-pay

## 2020-09-03 ENCOUNTER — Ambulatory Visit (INDEPENDENT_AMBULATORY_CARE_PROVIDER_SITE_OTHER): Payer: Medicare Other | Admitting: Dermatology

## 2020-09-03 DIAGNOSIS — B009 Herpesviral infection, unspecified: Secondary | ICD-10-CM

## 2020-09-03 DIAGNOSIS — L309 Dermatitis, unspecified: Secondary | ICD-10-CM

## 2020-09-03 MED ORDER — VALACYCLOVIR HCL 1 G PO TABS
1000.0000 mg | ORAL_TABLET | Freq: Two times a day (BID) | ORAL | 0 refills | Status: AC
Start: 2020-09-03 — End: ?

## 2020-09-03 NOTE — Progress Notes (Signed)
   New Patient   Subjective  [name] is a 60 y.o. male who presents for the following: Rash (Patient keeps getting rash like sores around his face. Patient also gets them on legs, hip and chest. Come up like a pimple but never come to a head. They are very painful but no itch. Been going on for years. Rheumatologist sent him over. He has a lot of autoimmune disorders. No treatment. Patient has photos when it was flared really bad. They do have a clear drainage. ).  Excellent historian who related a longstanding history of recurrent sores on legs, hip, chest and occasionally face. Location:  Duration:  Quality:  Associated Signs/Symptoms: Modifying Factors:  Severity:  Timing: Context:    The following portions of the chart were reviewed this encounter and updated as appropriate:      Objective  Well appearing patient in no apparent distress; mood and affect are within normal limits.   A full examination was performed including scalp, head, eyes, ears, nose, lips, neck, chest, axillae, abdomen, back, buttocks, bilateral upper extremities, bilateral lower extremities, hands, feet, fingers, toes, fingernails, and toenails. All findings within normal limits unless otherwise noted below.  Areas beneath undergarments not fully examined.   Assessment & Plan  HSV (herpes simplex virus) infection Left Hip  Ordered Medications: valACYclovir (VALTREX) 1000 MG tablet  Excellent historian with a combination of a specialty medication from [place] plus rituximab but when the specialty medicine was no longer available he has been losing ground with slowly progressive increasing weakness.  He has been concurrently getting intensely inflamed sores scattered widely over his body and he provided a photograph of a recent flare on the left side of his neck which was a hemorrhagic vesicular roughly 2 cm patch.  This has largely healed but there are 2 more recent open for millimeter sores on the  left back hip which I suspect were initially vesicular.  Does have some actinic keratoses on the face and the scalp but these are not a pressing issue and I did not discuss anything related to treating these.  I told [name] that my differential included microbial etiology (HSV and a somewhat immune compromised individual more likely than other viruses or bacteria) and a variety of noninfectious inflammatory disorders like PLEVA.  I did decide to do an empirical trial with an antiviral, valacyclovir, 1 g initially twice daily and then take 1 daily for the following 3 weeks.  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

## 2020-09-08 NOTE — Progress Notes (Deleted)
Office Visit Note  Patient: [name]             Date of Birth: 06/26/1960           MRN: [id number]             PCP: Vernie Shanks, MD Referring: No ref. provider found Visit Date: 09/17/2020 Occupation: @GUAROCC @  Subjective:  No chief complaint on file.   History of Present Illness: [name] is a 60 y.o. male ***   Activities of Daily Living:  Patient reports morning stiffness for *** {minute/hour:19697}.   Patient {ACTIONS;DENIES/REPORTS:21021675::"Denies"} nocturnal pain.  Difficulty dressing/grooming: {ACTIONS;DENIES/REPORTS:21021675::"Denies"} Difficulty climbing stairs: {ACTIONS;DENIES/REPORTS:21021675::"Denies"} Difficulty getting out of chair: {ACTIONS;DENIES/REPORTS:21021675::"Denies"} Difficulty using hands for taps, buttons, cutlery, and/or writing: {ACTIONS;DENIES/REPORTS:21021675::"Denies"}  No Rheumatology ROS completed.   PMFS History:  Patient Active Problem List   Diagnosis Date Noted  . Ocular migraine 11/30/2019  . Diabetes ([place]) 02/28/2013  . Disturbance in sleep behavior 06/17/2011  . Encounter for therapeutic drug monitoring 06/17/2011  . Myasthenic syndromes in diseases classified elsewhere 06/17/2011  . Murmur   . Ejection fraction   . Depression   . Back pain   . 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 QT interval   . Hyperthyroidism   . Pre-syncope   . Anomalous coronary artery origin     Past Medical History:  Diagnosis Date  . Anomalous coronary artery origin    The patient underwent cardiac catheterization in 2004.  Coronaries revealed no stenosis.  However he had aberrant takeoff of both the right and left coronaries from the anterior aortic cusp.  There was no evidence of renal artery stenosis  . Arthritis   . Back pain   . Broken fibula    right  . Depression   . Diabetes mellitus without complication ([place])   . Dyslipidemia   . Dyslipidemia   . Ejection fraction    EF 60-65%, echo, March 18, 2011  . H/O  radioactive iodine thyroid ablation   . History of plasmapheresis   . Hypertension   . Hyperthyroidism    November, 201 to  . 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 and Dr. Floyde Parkins  . 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 Murmur    Systolic murmur, December, 2012,No valvular abnormalities, echo, December, 2012  . Obesity   . Ocular migraine 11/30/2019  . Pre-syncope    February 23, 2011  . QT interval    Question prolongation QT interval, October, 2012, review of EKG dated February 04, 2011, QT interval was not prolonged    Family History  Problem Relation Age of Onset  . Hypertension Mother   . Fibromyalgia Mother   . Cirrhosis Father   . Autoimmune disease Sister   . Hypertension Brother   . Cirrhosis Maternal Grandmother        non alcoholic  . Heart disease Other   . Stroke Other   . Cancer Other   . Healthy Son   . Healthy Son    Past Surgical History:  Procedure Laterality Date  . KNEE SURGERY Right   . PORT-A-CATH REMOVAL N/A 11/12/2017   Procedure: REMOVAL PORT-A-CATH;  Surgeon: Clovis Riley, MD;  Location: [place];  Service: General;  Laterality: N/A;  . PORTACATH PLACEMENT    . TONSILLECTOMY     Social History   Social History Narrative   Patient lives at home with his wife Caren Griffins).   Disabled.   Education high  school.   Right handed.   Caffeine None .   Immunization History  Administered Date(s) Administered  . Moderna Sars-Covid-2 Vaccination 04/22/2019, 05/20/2019, 02/08/2020     Objective: Vital Signs: There were no vitals taken for this visit.   Physical Exam   Musculoskeletal Exam: ***  CDAI Exam: CDAI Score: -- Patient Global: --; Provider Global: -- Swollen: --; Tender: -- Joint Exam 09/17/2020   No joint exam has been documented for this visit   There is currently no information documented on the homunculus. Go to the Rheumatology activity and complete the homunculus joint exam.  Investigation: No  additional findings.  Imaging: XR Foot 2 Views Left  Result Date: 08/19/2020 PIP and DIP narrowing was noted.  Dorsal spurring was noted.  Inferior and posterior calcaneal spurs were noted.  No MTP or intertarsal joint space narrowing was noted.  No erosive changes were noted. Impression: These findings are consistent with osteoarthritis of the foot.  XR Foot 2 Views Right  Result Date: 08/19/2020 PIP and DIP narrowing was noted.  No MTP, intertarsal narrowing was noted.  Dorsal spurring was noted.  Inferior and posterior calcaneal spurs were noted.  No erosive changes were noted. Impression: These findings are consistent with osteoarthritis of the foot.  XR Hand 2 View Left  Result Date: 08/19/2020 CMC, PIP and DIP narrowing was noted.  No MCP, intercarpal or radiocarpal joint space narrowing was noted.  No erosive changes were noted. Impression: These findings are consistent with osteoarthritis of the hand.  XR Hand 2 View Right  Result Date: 08/19/2020 CMC, PIP and DIP narrowing was noted.  Partial amputation of right second distal phalanx was noted.  No MCP, intercarpal or radiocarpal joint space narrowing was noted.  No erosive changes were noted. Impression: These findings are consistent with osteoarthritis of the limb.  XR KNEE 3 VIEW RIGHT  Result Date: 08/19/2020 No medial or lateral compartment narrowing was noted.  No patellofemoral narrowing was noted.  No chondrocalcinosis was noted. Impression: Unremarkable x-ray of the knee.   Recent Labs: Lab Results  Component Value Date   WBC 5.9 11/10/2017   HGB 15.3 11/10/2017   PLT 152 11/10/2017   NA 140 11/10/2017   K 3.8 11/10/2017   CL 104 11/10/2017   CO2 27 11/10/2017   GLUCOSE 91 11/10/2017   BUN 11 11/10/2017   CREATININE 0.94 11/10/2017   BILITOT 0.5 02/28/2013   ALKPHOS 77 02/28/2013   AST 55 (H) 02/28/2013   ALT 45 (H) 02/28/2013   PROT 6.9 02/28/2013   ALBUMIN 4.5 02/28/2013   CALCIUM 9.5 11/10/2017   GFRAA >60  11/10/2017    Speciality Comments: No specialty comments available.  Procedures:  No procedures performed Allergies: Statins, Rituximab, Toradol [ketorolac tromethamine], and Vioxx [rofecoxib]   Assessment / Plan:     Visit Diagnoses: No diagnosis found.  Orders: No orders of the defined types were placed in this encounter.  No orders of the defined types were placed in this encounter.   Face-to-face time spent with patient was *** minutes. Greater than 50% of time was spent in counseling and coordination of care.  Follow-Up Instructions: No follow-ups on file.   Bo Merino, MD  Note - This record has been created using Editor, commissioning.  Chart creation errors have been sought, but may not always  have been located. Such creation errors do not reflect on  the standard of medical care.

## 2020-09-16 ENCOUNTER — Encounter: Payer: Self-pay | Admitting: Dermatology

## 2020-09-17 ENCOUNTER — Ambulatory Visit: Payer: Medicare Other | Admitting: Rheumatology

## 2020-09-17 DIAGNOSIS — Z789 Other specified health status: Secondary | ICD-10-CM

## 2020-09-17 DIAGNOSIS — I7300 Raynaud's syndrome without gangrene: Secondary | ICD-10-CM

## 2020-09-17 DIAGNOSIS — Z8639 Personal history of other endocrine, nutritional and metabolic disease: Secondary | ICD-10-CM

## 2020-09-17 DIAGNOSIS — G43109 Migraine with aura, not intractable, without status migrainosus: Secondary | ICD-10-CM

## 2020-09-17 DIAGNOSIS — R768 Other specified abnormal immunological findings in serum: Secondary | ICD-10-CM

## 2020-09-17 DIAGNOSIS — E0590 Thyrotoxicosis, unspecified without thyrotoxic crisis or storm: Secondary | ICD-10-CM

## 2020-09-17 DIAGNOSIS — R21 Rash and other nonspecific skin eruption: Secondary | ICD-10-CM

## 2020-09-17 DIAGNOSIS — Q245 Malformation of coronary vessels: Secondary | ICD-10-CM

## 2020-09-17 DIAGNOSIS — E7800 Pure hypercholesterolemia, unspecified: Secondary | ICD-10-CM

## 2020-09-17 DIAGNOSIS — Z8659 Personal history of other mental and behavioral disorders: Secondary | ICD-10-CM

## 2020-09-17 DIAGNOSIS — M7918 Myalgia, other site: Secondary | ICD-10-CM

## 2020-09-17 DIAGNOSIS — G8929 Other chronic pain: Secondary | ICD-10-CM

## 2020-09-17 DIAGNOSIS — M19071 Primary osteoarthritis, right ankle and foot: Secondary | ICD-10-CM

## 2020-09-17 DIAGNOSIS — G7080 Lambert-Eaton syndrome, unspecified: Secondary | ICD-10-CM

## 2020-09-17 DIAGNOSIS — M3500 Sicca syndrome, unspecified: Secondary | ICD-10-CM

## 2020-09-17 DIAGNOSIS — M19041 Primary osteoarthritis, right hand: Secondary | ICD-10-CM

## 2020-09-21 NOTE — Progress Notes (Deleted)
Office Visit Note  Patient: [name]             Date of Birth: [birth year]           MRN: [id number]             PCP: Vernie Shanks, MD Referring: Vernie Shanks, MD Visit Date: 10/01/2020 Occupation: @GUAROCC @  Subjective:  No chief complaint on file.   History of Present Illness: [name] is a 60 y.o. male ***   Activities of Daily Living:  Patient reports morning stiffness for *** {minute/hour:19697}.   Patient {ACTIONS;DENIES/REPORTS:21021675::"Denies"} nocturnal pain.  Difficulty dressing/grooming: {ACTIONS;DENIES/REPORTS:21021675::"Denies"} Difficulty climbing stairs: {ACTIONS;DENIES/REPORTS:21021675::"Denies"} Difficulty getting out of chair: {ACTIONS;DENIES/REPORTS:21021675::"Denies"} Difficulty using hands for taps, buttons, cutlery, and/or writing: {ACTIONS;DENIES/REPORTS:21021675::"Denies"}  No Rheumatology ROS completed.   PMFS History:  Patient Active Problem List   Diagnosis Date Noted   Ocular migraine 11/30/2019   Diabetes ([place]) 02/28/2013   Disturbance in sleep behavior 06/17/2011   Encounter for therapeutic drug monitoring 06/17/2011   Myasthenic syndromes in diseases classified elsewhere 06/17/2011   Murmur    Ejection fraction    Depression    Back pain    Lambert-Eaton myasthenic syndrome (HCC)    QT interval    Hyperthyroidism    Pre-syncope    Anomalous coronary artery origin     Past Medical History:  Diagnosis Date   Anomalous coronary artery origin    The patient underwent cardiac catheterization in 2004.  Coronaries revealed no stenosis.  However he had aberrant takeoff of both the right and left coronaries from the anterior aortic cusp.  There was no evidence of renal artery stenosis   Arthritis    Back pain    Broken fibula    right   Depression    Diabetes mellitus without complication ([place])    Dyslipidemia    Dyslipidemia    Ejection fraction    EF 60-65%, echo, March 18, 2011   H/O radioactive iodine thyroid  ablation    History of plasmapheresis    Hypertension    Hyperthyroidism    November, 201 to   Lambert-Eaton myasthenic syndrome ([place])    Followed at [facility]. and Dr. Floyde Parkins   [name] [facility])    Murmur    Systolic murmur, December, 2012,No valvular abnormalities, echo, December, 2012   Obesity    Ocular migraine 11/30/2019   Pre-syncope    February 23, 2011   QT interval    Question prolongation QT interval, October, 2012, review of EKG dated February 04, 2011, QT interval was not prolonged    Family History  Problem Relation Age of Onset   Hypertension Mother    Fibromyalgia Mother    Cirrhosis Father    Autoimmune disease Sister    Hypertension Brother    Cirrhosis Maternal Grandmother        non alcoholic   Heart disease Other    Stroke Other    Cancer Other    Healthy Son    Healthy Son    Past Surgical History:  Procedure Laterality Date   KNEE SURGERY Right    PORT-A-CATH REMOVAL N/A 11/12/2017   Procedure: REMOVAL PORT-A-CATH;  Surgeon: Clovis Riley, MD;  Location: [place];  Service: General;  Laterality: N/A;   PORTACATH PLACEMENT     TONSILLECTOMY     Social History   Social History Narrative   Patient lives at home with his wife Caren Griffins).   Disabled.   Education high  school.   Right handed.   Caffeine None .   Immunization History  Administered Date(s) Administered   Moderna Sars-Covid-2 Vaccination 04/22/2019, 05/20/2019, 02/08/2020     Objective: Vital Signs: There were no vitals taken for this visit.   Physical Exam   Musculoskeletal Exam:   CDAI Exam: CDAI Score: -- Patient Global: --; Provider Global: -- Swollen: --; Tender: -- Joint Exam 10/01/2020   No joint exam has been documented for this visit   There is currently no information documented on the homunculus. Go to the Rheumatology activity and complete the homunculus joint exam.  Investigation: No additional findings.  Imaging: No results found.  Recent  Labs: Lab Results  Component Value Date   WBC 5.9 11/10/2017   HGB 15.3 11/10/2017   PLT 152 11/10/2017   NA 140 11/10/2017   K 3.8 11/10/2017   CL 104 11/10/2017   CO2 27 11/10/2017   GLUCOSE 91 11/10/2017   BUN 11 11/10/2017   CREATININE 0.94 11/10/2017   BILITOT 0.5 02/28/2013   ALKPHOS 77 02/28/2013   AST 55 (H) 02/28/2013   ALT 45 (H) 02/28/2013   PROT 6.9 02/28/2013   ALBUMIN 4.5 02/28/2013   CALCIUM 9.5 11/10/2017   GFRAA >60 11/10/2017   AVISE  Speciality Comments: No specialty comments available.  Procedures:  No procedures performed Allergies: Statins, Rituximab, Toradol [ketorolac tromethamine], and Vioxx [rofecoxib]   Assessment / Plan:     Visit Diagnoses: Positive ANA (antinuclear antibody) - dxd with SLE in 1993by dr. Justine Null ttd with PLQ x 87yr. then dx was changed toE-L synd at [facility] and PlQ was dcd.H/o OUs and sicca symptomsx 1 yr.  Sicca complex ([place]) - History of dry mouth and dry eyes over the last year.  Raynaud's disease without gangrene - He gives history of mild Raynauds symptoms.  No digital cyanosis was noted.  No sclerodactyly or nailbed capillary changes were noted.  Rash - He was evaluated by Dr. Denna Haggard and was diagnosed with herpes infection.    Primary osteoarthritis of both hands - Clinical and radiographic findings are consistent with osteoarthritis.  Chronic pain of right knee - No synovitis was noted.  X-rays were unremarkable.  Primary osteoarthritis of both feet - Clinical and radiographic findings were consistent with osteoarthritis.  Myalgia - He had generalized hyperalgesia and positive tender points consistent with myofascial pain.  Anomalous coronary artery origin  Hypercholesteremia  Statin intolerance  History of diabetes mellitus  Lambert-Eaton myasthenic syndrome ([place]) -  followed by Dr. Jannifer Franklin and at [facility].  Ocular migraine  Hyperthyroidism  History of depression  Orders: No orders of the defined types were  placed in this encounter.  No orders of the defined types were placed in this encounter.   Face-to-face time spent with patient was *** minutes. Greater than 50% of time was spent in counseling and coordination of care.  Follow-Up Instructions: No follow-ups on file.   Bo Merino, MD  Note - This record has been created using Editor, commissioning.  Chart creation errors have been sought, but may not always  have been located. Such creation errors do not reflect on  the standard of medical care.

## 2020-09-24 ENCOUNTER — Encounter: Payer: Self-pay | Admitting: Dermatology

## 2020-09-27 ENCOUNTER — Other Ambulatory Visit: Payer: Self-pay | Admitting: Neurology

## 2020-09-30 NOTE — Telephone Encounter (Signed)
I called the patient, will refill his hydrocodone. We discussed will need to look for alternatives for him to receive this medication once Dr. Jamey Ripa. He claims PCP won't refill, can discuss pain clinic, has appointment with Dr. Jannifer Franklin this week. He mentions having Dr. Krista Blue follow him when Dr. Jamey Ripa, one of his [facility] doctors recommended her.

## 2020-10-01 ENCOUNTER — Ambulatory Visit: Payer: Medicare Other | Admitting: Rheumatology

## 2020-10-03 ENCOUNTER — Encounter: Payer: Self-pay | Admitting: Neurology

## 2020-10-03 ENCOUNTER — Telehealth: Payer: Self-pay | Admitting: Neurology

## 2020-10-03 ENCOUNTER — Telehealth: Payer: Self-pay | Admitting: Dermatology

## 2020-10-03 ENCOUNTER — Ambulatory Visit (INDEPENDENT_AMBULATORY_CARE_PROVIDER_SITE_OTHER): Payer: Medicare Other | Admitting: Neurology

## 2020-10-03 VITALS — BP 148/73 | HR 62 | Ht 70.0 in | Wt 213.8 lb

## 2020-10-03 DIAGNOSIS — G7080 Lambert-Eaton syndrome, unspecified: Secondary | ICD-10-CM | POA: Diagnosis not present

## 2020-10-03 DIAGNOSIS — Z5181 Encounter for therapeutic drug level monitoring: Secondary | ICD-10-CM

## 2020-10-03 MED ORDER — RITUXIMAB CHEMO INJECTION 100 MG/10ML: 500.0000 mg | Freq: Once | INTRAVENOUS | 1 refills | Status: DC

## 2020-10-03 MED ORDER — RITUXIMAB CHEMO INJECTION 100 MG/10ML: 500.0000 mg | Freq: Once | INTRAVENOUS | 1 refills | Status: AC

## 2020-10-03 NOTE — Telephone Encounter (Signed)
I called the patient.  I discussed starting rituximab with infusion nurse who indicated that blood work needs to be done prior to [employer] request for approval.  I will get the patient in for blood work at this time.

## 2020-10-03 NOTE — Telephone Encounter (Signed)
Says ST told him to call if he breaks out in the sores that he had before; now has some that are healing + new ones that are coming in. He says he sent pictures on my chart.

## 2020-10-03 NOTE — Addendum Note (Signed)
Addended by: Kathrynn Ducking on: 10/03/2020 12:57 PM   Modules accepted: Orders

## 2020-10-03 NOTE — Progress Notes (Signed)
Reason for visit: [name] is an 60 y.o. male  History of present illness:  [name] is a 60 year old right-handed white male with a history of [name].  He also has a history of diabetes and obesity.  The patient has been switched from DAP to Firdapse, following the switch, he has not done well but he is unable to go back on DAP.  The patient is followed through [facility].  They have recommended that he go on Rituxan, but apparently the insurance company would not pay for the medication.  The patient has been placed on prednisone 40 mg daily, and this has adversely affected his diabetes and resulted in some weight gain as well.  The patient feels muscular fatigue throughout the day, he feels as if his legs would not hold him up but he has not had any falls.  He has difficulty climbing stairs.  He has droopiness of the eyelids but no significant double vision.  He reports some difficulty with swallowing at times.  The patient feels that his disease process is no longer in good control.  At times, he may have troubles with tremors but he drinks a lot of caffeinated products throughout the day.  He returns to the office today for further evaluation.  Past Medical History:  Diagnosis Date   Anomalous coronary artery origin    The patient underwent cardiac catheterization in 2004.  Coronaries revealed no stenosis.  However he had aberrant takeoff of both the right and left coronaries from the anterior aortic cusp.  There was no evidence of renal artery stenosis   Arthritis    Back pain    Broken fibula    right   Depression    Diabetes mellitus without complication ([place])    Dyslipidemia    Dyslipidemia    Ejection fraction    EF 60-65%, echo, March 18, 2011   H/O radioactive iodine thyroid ablation    History of plasmapheresis    Hypertension    Hyperthyroidism    November, 201 to   Lambert-Eaton myasthenic syndrome ([place])     Followed at [facility]. and Dr. Floyde Parkins   [name] [facility])    Murmur    Systolic murmur, December, 2012,No valvular abnormalities, echo, December, 2012   Obesity    Ocular migraine 11/30/2019   Pre-syncope    February 23, 2011   QT interval    Question prolongation QT interval, October, 2012, review of EKG dated February 04, 2011, QT interval was not prolonged    Past Surgical History:  Procedure Laterality Date   KNEE SURGERY Right    PORT-A-CATH REMOVAL N/A 11/12/2017   Procedure: REMOVAL PORT-A-CATH;  Surgeon: Clovis Riley, MD;  Location: [place];  Service: General;  Laterality: N/A;   PORTACATH PLACEMENT     TONSILLECTOMY      Family History  Problem Relation Age of Onset   Hypertension Mother    Fibromyalgia Mother    Cirrhosis Father    Autoimmune disease Sister    Hypertension Brother    Cirrhosis Maternal Grandmother        non alcoholic   Heart disease Other    Stroke Other    Cancer Other    Healthy Son    Healthy Son     Social history:  reports that he has never smoked. He has never used smokeless tobacco. He reports previous alcohol use. He reports that he does not use drugs.  Allergies  Allergen Reactions   Statins Other (See Comments)    MYALGIAS   Rituximab     UNSPECIFIED REACTION    Toradol [Ketorolac Tromethamine]     UNSPECIFIED REACTION    Vioxx [Rofecoxib]     UNSPECIFIED REACTION     Medications:  Prior to Admission medications   Medication Sig Start Date End Date Taking? Authorizing Provider  ALPRAZolam Duanne Moron) 0.5 MG tablet TAKE (1) TABLET THREE TIMES DAILY AS NEEDED. 07/31/20   Suzzanne Cloud, NP  Coenzyme Q10 (CO Q-10) 200 MG CAPS Take 200 mg by mouth daily.    [provider]  DULoxetine (CYMBALTA) 30 MG capsule Take 2 capsules (60 mg total) by mouth daily. 11/30/19   Kathrynn Ducking, MD  ezetimibe (ZETIA) 10 MG tablet Take 10 mg by mouth daily. 05/05/19   [provider]  FARXIGA 10 MG TABS tablet  Take 10 mg by mouth daily. 11/02/19   [provider]  FIRDAPSE 10 MG TABS Take 80 mg by mouth daily. 08/07/20   [provider]  glimepiride (AMARYL) 2 MG tablet Take 2 mg by mouth daily with breakfast.    [provider]  HYDROcodone-acetaminophen (NORCO/VICODIN) 5-325 MG tablet TAKE (1) TABLET EVERY SIX HOURS AS NEEDED. MUST LAST 28 DAYS 09/30/20   Suzzanne Cloud, NP  levothyroxine (SYNTHROID) 112 MCG tablet Take 112 mcg by mouth daily. 08/29/20   [provider]  levothyroxine (SYNTHROID) 137 MCG tablet Take 137 mcg by mouth daily. 05/05/19   [provider]  lisinopril (PRINIVIL,ZESTRIL) 5 MG tablet TAKE 1 TABLET (5 MG TOTAL) BY MOUTH DAILY.    Lysbeth Penner, FNP  Multiple Vitamin (MULTIVITAMIN WITH MINERALS) TABS tablet Take 1 tablet by mouth daily. One-A-Day    [provider]  Omega-3 Fatty Acids (FISH OIL) 1200 MG CAPS Take 1,200 mg by mouth 3 (three) times daily.    [provider]  predniSONE (DELTASONE) 20 MG tablet Take 40 mg by mouth daily with breakfast.    [provider]  pyridostigmine (MESTINON) 60 MG tablet TAKE  (1)  TABLET  THREE TIMES DAILY. 02/10/18   Kathrynn Ducking, MD  rosuvastatin (CRESTOR) 5 MG tablet SMARTSIG:0.5 Tablet(s) By Mouth 3 Times a Week 11/02/19   [provider]  topiramate (TOPAMAX) 100 MG tablet Take 100 mg by mouth daily. 08/31/20   [provider]  valACYclovir (VALTREX) 1000 MG tablet Take 1 tablet (1,000 mg total) by mouth 2 (two) times daily. 09/03/20   Lavonna Monarch, MD  zolpidem (AMBIEN) 10 MG tablet TAKE 1 TABLET AT BEDTIME AS NEEDED FOR SLEEP 08/29/20   Suzzanne Cloud, NP    ROS:  Out of a complete 14 system review of symptoms, the patient complains only of the following symptoms, and all other reviewed systems are negative.  Muscle weakness, fatigue Tremor Insomnia  Blood pressure (!) 148/73, pulse 62, height 5\' 10"  (1.778 m), weight 213 lb 12.8 oz (97  kg).  Physical Exam  General: The patient is alert and cooperative at the time of the examination.  The patient is moderately obese.  Skin: No significant peripheral edema is noted.   Neurologic Exam  Mental status: The patient is alert and oriented x 3 at the time of the examination. The patient has apparent normal recent and remote memory, with an apparently normal attention span and concentration ability.   Cranial nerves: Facial symmetry is not present.  Slight ptosis of the left eye is  noted.  Speech is normal, no aphasia or dysarthria is noted. Extraocular movements are full. Visual fields are full.  Motor: The patient has good strength in all 4 extremities.  When trying to stand up from a seated position with arms crossed, the patient has difficulty performing this, but he can eventually stand up.  Sensory examination: Soft touch sensation is symmetric on the face, arms, and legs.  Coordination: The patient has good finger-nose-finger and heel-to-shin bilaterally.  Gait and station: The patient has a normal gait. Tandem gait is slightly unsteady. Romberg is negative. No drift is seen.  Reflexes: Deep tendon reflexes are symmetric.   Assessment/Plan:  1.  [name]  2.  Diabetes  The patient is currently not fully controlled on his medication regimen for his [name].  He has been placed on prednisone which is adversely affecting his diabetes and his general health.  In the past, he has been on Rituxan and has had an excellent response to the medication, he did quite well while on the medication.  I will once again try to get the patient started on rituximab, I would recommend 500 mg IV with a repeat dose 2 weeks later, and then administer 500 mg every 6 months.  Rituximab represents an effective therapy for this patient, he is not fully controlled on his current medical regimen.  He will follow-up here in 6 months, he may see Dr. Krista Blue in the  future.  Jill Alexanders MD 10/03/2020 8:16 AM  [facility] [address] [place], [place] [address]  Phone [phone] Fax [phone]

## 2020-10-08 ENCOUNTER — Other Ambulatory Visit (INDEPENDENT_AMBULATORY_CARE_PROVIDER_SITE_OTHER): Payer: Medicare Other

## 2020-10-08 DIAGNOSIS — Z5181 Encounter for therapeutic drug level monitoring: Secondary | ICD-10-CM | POA: Diagnosis not present

## 2020-10-08 DIAGNOSIS — Z0289 Encounter for other administrative examinations: Secondary | ICD-10-CM

## 2020-10-10 NOTE — Telephone Encounter (Signed)
Patient has ov 7/6

## 2020-10-12 LAB — IGG, IGA, IGM
IgA/Immunoglobulin A, Serum: 280 mg/dL (ref 90–386)
IgG (Immunoglobin G), Serum: 604 mg/dL (ref 603–1613)
IgM (Immunoglobulin M), Srm: 94 mg/dL (ref 20–172)

## 2020-10-12 LAB — COMPREHENSIVE METABOLIC PANEL
ALT: 33 IU/L (ref 0–44)
AST: 22 IU/L (ref 0–40)
Albumin/Globulin Ratio: 2.3 — ABNORMAL HIGH (ref 1.2–2.2)
Albumin: 4.5 g/dL (ref 3.8–4.9)
Alkaline Phosphatase: 78 IU/L (ref 44–121)
BUN/Creatinine Ratio: 15 (ref 9–20)
BUN: 14 mg/dL (ref 6–24)
Bilirubin Total: 0.3 mg/dL (ref 0.0–1.2)
CO2: 23 mmol/L (ref 20–29)
Calcium: 9.4 mg/dL (ref 8.7–10.2)
Chloride: 102 mmol/L (ref 96–106)
Creatinine, Ser: 0.95 mg/dL (ref 0.76–1.27)
Globulin, Total: 2 g/dL (ref 1.5–4.5)
Glucose: 154 mg/dL — ABNORMAL HIGH (ref 65–99)
Potassium: 3.8 mmol/L (ref 3.5–5.2)
Sodium: 143 mmol/L (ref 134–144)
Total Protein: 6.5 g/dL (ref 6.0–8.5)
eGFR: 93 mL/min/{1.73_m2} (ref >59)

## 2020-10-12 LAB — CBC WITH DIFFERENTIAL/PLATELET
Basophils Absolute: 0 x10E3/uL (ref 0.0–0.2)
Basos: 0 %
EOS (ABSOLUTE): 0 x10E3/uL (ref 0.0–0.4)
Eos: 0 %
Hematocrit: 44.1 % (ref 37.5–51.0)
Hemoglobin: 15.4 g/dL (ref 13.0–17.7)
Immature Grans (Abs): 0.1 x10E3/uL (ref 0.0–0.1)
Immature Granulocytes: 1 %
Lymphocytes Absolute: 0.9 x10E3/uL (ref 0.7–3.1)
Lymphs: 9 %
MCH: 32.2 pg (ref 26.6–33.0)
MCHC: 34.9 g/dL (ref 31.5–35.7)
MCV: 92 fL (ref 79–97)
Monocytes Absolute: 0.5 x10E3/uL (ref 0.1–0.9)
Monocytes: 6 %
Neutrophils Absolute: 8 x10E3/uL — ABNORMAL HIGH (ref 1.4–7.0)
Neutrophils: 84 %
Platelets: 144 x10E3/uL — ABNORMAL LOW (ref 150–450)
RBC: 4.78 x10E6/uL (ref 4.14–5.80)
RDW: 13.9 % (ref 11.6–15.4)
WBC: 9.5 x10E3/uL (ref 3.4–10.8)

## 2020-10-12 LAB — QUANTIFERON-TB GOLD PLUS
QuantiFERON Mitogen Value: 7.32 IU/mL
QuantiFERON Nil Value: 0 IU/mL
QuantiFERON TB1 Ag Value: 0 IU/mL
QuantiFERON TB2 Ag Value: 0 IU/mL
QuantiFERON-TB Gold Plus: NEGATIVE

## 2020-10-12 LAB — HEPATITIS B SURFACE ANTIBODY,QUALITATIVE: Hep B Surface Ab, Qual: REACTIVE

## 2020-10-12 LAB — HEPATITIS B CORE ANTIBODY, TOTAL: Hep B Core Total Ab: NEGATIVE

## 2020-10-12 LAB — HEPATITIS C ANTIBODY: Hep C Virus Ab: <0.1 s/co ratio (ref 0.0–0.9)

## 2020-10-12 LAB — VARICELLA ZOSTER ANTIBODY, IGG: Varicella zoster IgG: 494 index (ref >165)

## 2020-10-15 DIAGNOSIS — R21 Rash and other nonspecific skin eruption: Secondary | ICD-10-CM | POA: Diagnosis not present

## 2020-10-15 DIAGNOSIS — W57XXXA Bitten or stung by nonvenomous insect and other nonvenomous arthropods, initial encounter: Secondary | ICD-10-CM | POA: Diagnosis not present

## 2020-10-16 ENCOUNTER — Encounter: Payer: Self-pay | Admitting: Dermatology

## 2020-10-16 ENCOUNTER — Other Ambulatory Visit: Payer: Self-pay

## 2020-10-16 ENCOUNTER — Ambulatory Visit (INDEPENDENT_AMBULATORY_CARE_PROVIDER_SITE_OTHER): Payer: Medicare Other | Admitting: Dermatology

## 2020-10-16 DIAGNOSIS — B9689 Other specified bacterial agents as the cause of diseases classified elsewhere: Secondary | ICD-10-CM

## 2020-10-16 DIAGNOSIS — D485 Neoplasm of uncertain behavior of skin: Secondary | ICD-10-CM

## 2020-10-16 DIAGNOSIS — B349 Viral infection, unspecified: Secondary | ICD-10-CM | POA: Diagnosis not present

## 2020-10-16 DIAGNOSIS — L739 Follicular disorder, unspecified: Secondary | ICD-10-CM | POA: Diagnosis not present

## 2020-10-16 DIAGNOSIS — L089 Local infection of the skin and subcutaneous tissue, unspecified: Secondary | ICD-10-CM

## 2020-10-16 MED ORDER — SILVER SULFADIAZINE 1 % EX CREA: 1.0000 "application " | TOPICAL_CREAM | Freq: Every day | CUTANEOUS | 0 refills | Status: DC

## 2020-10-16 NOTE — Patient Instructions (Signed)

## 2020-10-16 NOTE — Progress Notes (Signed)
Ritux infusion if fails Dapsone

## 2020-10-18 DIAGNOSIS — R768 Other specified abnormal immunological findings in serum: Secondary | ICD-10-CM | POA: Diagnosis not present

## 2020-10-18 DIAGNOSIS — G7080 Lambert-Eaton syndrome, unspecified: Secondary | ICD-10-CM | POA: Diagnosis not present

## 2020-10-20 ENCOUNTER — Encounter: Payer: Self-pay | Admitting: Neurology

## 2020-10-21 DIAGNOSIS — E890 Postprocedural hypothyroidism: Secondary | ICD-10-CM | POA: Diagnosis not present

## 2020-10-21 DIAGNOSIS — E1165 Type 2 diabetes mellitus with hyperglycemia: Secondary | ICD-10-CM | POA: Diagnosis not present

## 2020-10-21 DIAGNOSIS — E7800 Pure hypercholesterolemia, unspecified: Secondary | ICD-10-CM | POA: Diagnosis not present

## 2020-10-22 LAB — ANAEROBIC AND AEROBIC CULTURE
MICRO NUMBER:: 12086588
MICRO NUMBER:: 12086589
SPECIMEN QUALITY:: ADEQUATE
SPECIMEN QUALITY:: ADEQUATE

## 2020-10-24 DIAGNOSIS — G64 Other disorders of peripheral nervous system: Secondary | ICD-10-CM | POA: Diagnosis not present

## 2020-10-24 DIAGNOSIS — G7080 Lambert-Eaton syndrome, unspecified: Secondary | ICD-10-CM | POA: Diagnosis not present

## 2020-10-25 LAB — VIRAL CULTURE VIRC

## 2020-10-25 LAB — VARICELLA ZOSTER VIRUS,RAPID METHOD,CULTURE

## 2020-10-25 LAB — HERPES SIMPLEX VIRUS CULTURE W/RFLX TO TYPING

## 2020-10-28 ENCOUNTER — Other Ambulatory Visit: Payer: Self-pay | Admitting: Neurology

## 2020-10-28 DIAGNOSIS — E1165 Type 2 diabetes mellitus with hyperglycemia: Secondary | ICD-10-CM | POA: Diagnosis not present

## 2020-10-28 DIAGNOSIS — E291 Testicular hypofunction: Secondary | ICD-10-CM | POA: Diagnosis not present

## 2020-10-28 DIAGNOSIS — E890 Postprocedural hypothyroidism: Secondary | ICD-10-CM | POA: Diagnosis not present

## 2020-10-28 DIAGNOSIS — E7800 Pure hypercholesterolemia, unspecified: Secondary | ICD-10-CM | POA: Diagnosis not present

## 2020-10-28 DIAGNOSIS — Z789 Other specified health status: Secondary | ICD-10-CM | POA: Diagnosis not present

## 2020-10-28 DIAGNOSIS — I10 Essential (primary) hypertension: Secondary | ICD-10-CM | POA: Diagnosis not present

## 2020-10-28 DIAGNOSIS — G7000 Myasthenia gravis without (acute) exacerbation: Secondary | ICD-10-CM | POA: Diagnosis not present

## 2020-10-30 ENCOUNTER — Encounter: Payer: Self-pay | Admitting: Dermatology

## 2020-10-30 NOTE — Progress Notes (Signed)
   Follow-Up Visit   Subjective  [name] is a 60 y.o. male who presents for the following: Follow-up (Left neck- "flared" x 2 weeks- not using anything currently - spots are very sore and itch sometimes).  Flare of open sores particular around neck. Location:  Duration:  Quality:  Associated Signs/Symptoms: Modifying Factors:  Severity:  Timing: Context:   Objective  Well appearing patient in no apparent distress; mood and affect are within normal limits. Neck - Posterior Eroded inflamed papule, diagnosis uncertain.     Neck - Posterior Nondermatomal cluster of inflamed 5 to 7 mm eroded papules.  No adenopathy.  Differential includes HSV, bacterial pyoderma, nonmicrobial inflammatory disorder, excoriations.  Neck - Posterior Open erosions, several lesions combine for viral culture.    All skin waist up examined.   Assessment & Plan    Neoplasm of uncertain behavior of skin Neck - Posterior  Skin / nail biopsy Type of biopsy: punch   Informed consent: discussed and consent obtained   Timeout: patient name, date of birth, surgical site, and procedure verified   Procedure prep:  Patient was prepped and draped in usual sterile fashion (Non sterile) Prep type:  Chlorhexidine Anesthesia: the lesion was anesthetized in a standard fashion   Anesthetic:  1% lidocaine w/ epinephrine 1-100,000 local infiltration Punch size:  3 mm Suture size:  4-0 Suture type: nylon   Suture removal (days):  10 Hemostasis achieved with: suture   Outcome: patient tolerated procedure well    Specimen 1 - Surgical pathology Differential Diagnosis: folliculitis   Check Margins: No  Related Medications silver sulfADIAZINE (SILVADENE) 1 % cream Apply 1 application topically daily.  Skin infection, bacterial Neck - Posterior  Will obtain cultures and biopsies but [name] understands that this may be a relatively low yield procedure.  We will use topical Silvadene both for  its antimicrobial and possible symptom relief property.  Follow-up by MyChart or telephone next week.  Anaerobic and Aerobic Culture - Neck - Posterior  Viral infection Neck - Posterior  Viral culture  Viral culture - Neck - Posterior   Other Procedures Placed This Encounter Herpes Simplex Virus Culture w/ Rflx to Typing Varicella Zoster,Rapid Method,Culture  Apparently [name] was approved to again receive rituximab for his myasthenia gravis.  Historically this seems to have significantly improved the cutaneous findings, making me wonder whether it is a B-cell mediated inflammation.  Unless biopsy is specific or viral culture positive, I would consider oral dapsone as a possible "Plan B "for his skin disorder.   I, Lavonna Monarch, MD, have reviewed all documentation for this visit.  The documentation on 10/30/20 for the exam, diagnosis, procedures, and orders are all accurate and complete.

## 2020-11-05 DIAGNOSIS — G64 Other disorders of peripheral nervous system: Secondary | ICD-10-CM | POA: Diagnosis not present

## 2020-11-05 DIAGNOSIS — G7080 Lambert-Eaton syndrome, unspecified: Secondary | ICD-10-CM | POA: Diagnosis not present

## 2020-11-18 ENCOUNTER — Other Ambulatory Visit: Payer: Self-pay | Admitting: Neurology

## 2020-11-18 DIAGNOSIS — Z5181 Encounter for therapeutic drug level monitoring: Secondary | ICD-10-CM

## 2020-11-20 DIAGNOSIS — G8929 Other chronic pain: Secondary | ICD-10-CM | POA: Diagnosis not present

## 2020-11-20 DIAGNOSIS — W57XXXS Bitten or stung by nonvenomous insect and other nonvenomous arthropods, sequela: Secondary | ICD-10-CM | POA: Diagnosis not present

## 2020-11-20 DIAGNOSIS — L299 Pruritus, unspecified: Secondary | ICD-10-CM | POA: Diagnosis not present

## 2020-11-26 ENCOUNTER — Other Ambulatory Visit: Payer: Self-pay | Admitting: Neurology

## 2020-12-31 ENCOUNTER — Other Ambulatory Visit: Payer: Self-pay | Admitting: Neurology

## 2021-01-27 DIAGNOSIS — E890 Postprocedural hypothyroidism: Secondary | ICD-10-CM | POA: Diagnosis not present

## 2021-01-27 DIAGNOSIS — E7800 Pure hypercholesterolemia, unspecified: Secondary | ICD-10-CM | POA: Diagnosis not present

## 2021-01-27 DIAGNOSIS — E1165 Type 2 diabetes mellitus with hyperglycemia: Secondary | ICD-10-CM | POA: Diagnosis not present

## 2021-01-29 DIAGNOSIS — E7800 Pure hypercholesterolemia, unspecified: Secondary | ICD-10-CM | POA: Diagnosis not present

## 2021-01-29 DIAGNOSIS — E291 Testicular hypofunction: Secondary | ICD-10-CM | POA: Diagnosis not present

## 2021-01-29 DIAGNOSIS — Z789 Other specified health status: Secondary | ICD-10-CM | POA: Diagnosis not present

## 2021-01-29 DIAGNOSIS — E890 Postprocedural hypothyroidism: Secondary | ICD-10-CM | POA: Diagnosis not present

## 2021-01-29 DIAGNOSIS — G7000 Myasthenia gravis without (acute) exacerbation: Secondary | ICD-10-CM | POA: Diagnosis not present

## 2021-01-29 DIAGNOSIS — E1165 Type 2 diabetes mellitus with hyperglycemia: Secondary | ICD-10-CM | POA: Diagnosis not present

## 2021-01-29 DIAGNOSIS — I10 Essential (primary) hypertension: Secondary | ICD-10-CM | POA: Diagnosis not present

## 2021-02-10 ENCOUNTER — Telehealth: Payer: Self-pay | Admitting: Neurology

## 2021-02-10 NOTE — Telephone Encounter (Signed)
See prior telephone note

## 2021-02-11 ENCOUNTER — Telehealth: Payer: Self-pay | Admitting: Neurology

## 2021-02-11 NOTE — Telephone Encounter (Signed)
I reviewed controlled substance registry,  Patient get 30 tablets of 10 mg Ambien from Dr. Jannifer Franklin on January 30, 2021, Also Xanax 0.5 mg, 90 tablets for 30-day supply,  January 20, 2021: hydrocodone/Tylenol 5/325 90 tablets for 30-day supply from  [place], Anabel Bene, FNP   Please advise patient, our office will not continue prescribing his Xanax, and Ambien, he should continue the management of insomnia, anxiety by his primary care physician

## 2021-02-11 NOTE — Telephone Encounter (Signed)
I called patient to discuss. No answer, left a message asking him to call us back.  

## 2021-02-21 DIAGNOSIS — G8929 Other chronic pain: Secondary | ICD-10-CM | POA: Diagnosis not present

## 2021-02-21 DIAGNOSIS — E785 Hyperlipidemia, unspecified: Secondary | ICD-10-CM | POA: Diagnosis not present

## 2021-03-04 DIAGNOSIS — G7080 Lambert-Eaton syndrome, unspecified: Secondary | ICD-10-CM | POA: Diagnosis not present

## 2021-03-10 ENCOUNTER — Telehealth: Payer: Self-pay | Admitting: Neurology

## 2021-03-10 ENCOUNTER — Encounter: Payer: Self-pay | Admitting: *Deleted

## 2021-03-10 NOTE — Telephone Encounter (Signed)
I was able to reach the patient on his mobile number. He is aware of the new dates and times of his infusion. I also sent him a mychart message with the information.

## 2021-03-10 NOTE — Telephone Encounter (Signed)
I here I received a letter from his [place] neurologist Dr. Queen Slough, patient has dry dry mouth, suspicious for Sjogren's disease, asking to move up his rituximab infusion from January 2023 to December 2022.  Will confirm with [facility], about his infusion schedule,

## 2021-03-10 NOTE — Telephone Encounter (Addendum)
I spoke to [place] in [place]. The patient last received Rituxan 500mg  on 10/24/20 then another 500mg  on 11/05/20.  She moved his next infusion appts to December. He will received the same dosages on 12/13 (at 8am) and 04/08/21 (at noon).  He already has a pending follow up with Butler Denmark, NP on 04/10/21.  I left a message for the patient to return my call. I need to inform him of the new infusion dates and times.

## 2021-03-10 NOTE — Telephone Encounter (Signed)
I left a second message for the patient to return my call. I need to inform him of the new infusion dates and times.

## 2021-03-25 DIAGNOSIS — G64 Other disorders of peripheral nervous system: Secondary | ICD-10-CM | POA: Diagnosis not present

## 2021-03-25 DIAGNOSIS — G7080 Lambert-Eaton syndrome, unspecified: Secondary | ICD-10-CM | POA: Diagnosis not present

## 2021-04-08 DIAGNOSIS — G7080 Lambert-Eaton syndrome, unspecified: Secondary | ICD-10-CM | POA: Diagnosis not present

## 2021-04-10 ENCOUNTER — Ambulatory Visit: Payer: Medicare Other | Admitting: Neurology

## 2021-05-23 DIAGNOSIS — G8929 Other chronic pain: Secondary | ICD-10-CM | POA: Diagnosis not present

## 2021-06-05 DIAGNOSIS — R591 Generalized enlarged lymph nodes: Secondary | ICD-10-CM | POA: Diagnosis not present

## 2021-06-05 DIAGNOSIS — R682 Dry mouth, unspecified: Secondary | ICD-10-CM | POA: Diagnosis not present

## 2021-06-05 DIAGNOSIS — E890 Postprocedural hypothyroidism: Secondary | ICD-10-CM | POA: Diagnosis not present

## 2021-06-26 ENCOUNTER — Telehealth: Payer: Self-pay | Admitting: Neurology

## 2021-06-26 ENCOUNTER — Other Ambulatory Visit: Payer: Self-pay | Admitting: Neurology

## 2021-06-26 NOTE — Telephone Encounter (Signed)
Patient called in requesting to r/s appt with Judson Roch and was told by phone staff next available is August. Patient was then transferred to me and discussed that he only has 2 more days left of his Ambien and pharmacy will not refill because he has not seen [place] in quite a bit. I advised I would need to send a message to Dr. Krista Blue and ensure this is ok to schedule with her as I was unfamiliar with the diagnosis and wanted to make sure we got him in with the best provider for him. Upon further review, I do see Dr. Jannifer Franklin had stated to follow  up with Dr. Krista Blue in the future, which I didn't see until after I was off the phone. I did mention to the patient that back in November Dr. Krista Blue had stated we would not be prescribing his Ambien or Xanax any longer, that these would be managed by PCP and he stated this is the first he heard of this, to which I replied that we had tried to reach him I didn't see any documentation that we were able to speak with him about this. Dr. Krista Blue are you okay with adding this patient to your schedule? I recommended he follow up with PCP regarding his Ambien prescription he needs as we wouldn't be able to prescribe this.  ?

## 2021-06-27 NOTE — Telephone Encounter (Signed)
Last visit with our office was in June 2022 for Lamber-Eaton syndrome. ?Ok to add to my schedule ?

## 2021-06-30 NOTE — Telephone Encounter (Addendum)
Left message to return our call. We will try to get him worked into Dr.Yan's schedule this week. ? ?Per [facility], there is also an issue with his  Ruxience that needs to be addressed. ? ?Message from [facility]: ? ?

## 2021-06-30 NOTE — Telephone Encounter (Signed)
I spoke to the patient. He has been schedule to see Dr.Yan. Appt on 07/03/21. ?

## 2021-07-03 ENCOUNTER — Ambulatory Visit (INDEPENDENT_AMBULATORY_CARE_PROVIDER_SITE_OTHER): Payer: Medicare Other | Admitting: Neurology

## 2021-07-03 ENCOUNTER — Encounter: Payer: Self-pay | Admitting: Neurology

## 2021-07-03 VITALS — BP 165/79 | HR 63 | Ht 70.0 in | Wt 222.0 lb

## 2021-07-03 DIAGNOSIS — R52 Pain, unspecified: Secondary | ICD-10-CM | POA: Diagnosis not present

## 2021-07-03 DIAGNOSIS — G64 Other disorders of peripheral nervous system: Secondary | ICD-10-CM | POA: Diagnosis not present

## 2021-07-03 DIAGNOSIS — R799 Abnormal finding of blood chemistry, unspecified: Secondary | ICD-10-CM | POA: Diagnosis not present

## 2021-07-03 DIAGNOSIS — G7080 Lambert-Eaton syndrome, unspecified: Secondary | ICD-10-CM

## 2021-07-03 DIAGNOSIS — D582 Other hemoglobinopathies: Secondary | ICD-10-CM | POA: Diagnosis not present

## 2021-07-03 MED ORDER — DULOXETINE HCL 30 MG PO CPEP: 30.0000 mg | ORAL_CAPSULE | Freq: Every day | ORAL | 3 refills | Status: DC

## 2021-07-03 MED ORDER — PYRIDOSTIGMINE BROMIDE 60 MG PO TABS: 60.0000 mg | ORAL_TABLET | Freq: Three times a day (TID) | ORAL | 3 refills | Status: DC

## 2021-07-03 MED ORDER — CELECOXIB 100 MG PO CAPS: 100.0000 mg | ORAL_CAPSULE | Freq: Two times a day (BID) | ORAL | 0 refills | Status: DC | PRN

## 2021-07-03 MED ORDER — DULOXETINE HCL 60 MG PO CPEP: 60.0000 mg | ORAL_CAPSULE | Freq: Every day | ORAL | 3 refills | Status: DC

## 2021-07-03 NOTE — Patient Instructions (Signed)
Meds ordered this encounter  ?Medications  ? DULoxetine (CYMBALTA) 60 MG capsule  ?  Sig: Take 1 capsule (60 mg total) by mouth daily.  ?  Dispense:  90 capsule  ?  Refill:  3  ?  Make it '60mg'$ +'30mg'$  daily  ? DULoxetine (CYMBALTA) 30 MG capsule  ?  Sig: Take 1 capsule (30 mg total) by mouth daily.  ?  Dispense:  90 capsule  ?  Refill:  3  ?  Make it '60mg'$ +'30mg'$  daily  ? pyridostigmine (MESTINON) 60 MG tablet  ?  Sig: Take 1 tablet (60 mg total) by mouth 3 (three) times daily.  ?  Dispense:  270 tablet  ?  Refill:  3  ?    ?   ?

## 2021-07-03 NOTE — Progress Notes (Signed)
? ?Chief Complaint  ?Patient presents with  ? Follow-up  ?  Rm 15. Alone. ?Transitioning care from Dr. Jannifer Franklin. Lamber-Eaton syndrome. ?Discuss taking prednisone. C/o low energy. ?C/o shock sensation in arms and legs. States hands ache upon waking.  ? ? ? ? ?ASSESSMENT AND PLAN ? ?[name] is a 61 y.o. 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 Treated with rituximab 1000 mg every 6 months since 2005, in December 2022 the interval was decreased to every 5 months (500 units 2 weeks apart) because his reported early wearing off, ? Laboratory evaluations ? Continue Firdapse 10 mg 2 tablets 4 times a day, is getting prescription from Dr. Queen Slough at [facility], ? Mestinon, he complains of GI side effect with high-dose, also has mild bradycardia, advised him upper limit to 60 mg 3 tablets a day, may try 30 mg 4 times a day, ? ?Depression body achy pain ? Improved by Cymbalta 60 mg, will increase to 90 mg daily ? Celebrex 100 mg as needed ?   ? ? ?DIAGNOSTIC DATA (LABS, IMAGING, TESTING) ?- I reviewed patient records, labs, notes, testing and imaging myself where available. ? ? ?MEDICAL HISTORY: ? ?[name], is a 61 year old male, follow-up for [name], was diagnosed in 35.  Previously patient of Dr. Jannifer Franklin, ? ?I reviewed and summarized multiple previous notes including [facility] neuromuscular specialist Dr. Martin Majestic Juel's note. ? ?He presented with lower extremity weakness, later progressed to involve his upper extremity, also had ptosis, diplopia, dry eye and dry mouth. ? ?He underwent multiple evaluation for malignancy, all have been negative.  His diagnosis is based on positive P/Q type voltage-gated calcium channel antibodies. ? ?He had extensive treatment history, lack of response to prednisone, Cytoxan, Imuran, CellCept.  He did respond to IVIG, he had been receiving IVIG on a weekly basis from 1993-2006.  He was started on 3 4 DAP in 2001, result in sustained improvement.  Mestinon was  started in 2002, he does complains of GI side effect with higher dose, also have a history of bradycardia. ? ?From 2005, he has been treated with rituximab, initially was receiving 1000 mg IV infusion 2 weeks apart every 6 months.  From December 2022, he complains of returning generalized weakness fatigue at the end of the 4 months.  The interval has been shortened to every 5 months, he tolerated well. ? ?Most recent office visit with Dr. Queen Slough was in November 2022, he did report with each infusion he felt a boost of energy, less dyspnea on exertion, he can walk better, but it does not help his dry eye and dry mouth much. ? ?Since 3,4-DAP is no longer on the market, it was replaced by Firdapse, 10 mg 2 tablets 4 times a day, ? ?He was also seen by [facility] rheumatologist Dr. Flonnie Overman, for positive ANA, double-stranded DNA, ? ?Rituximab infusion through intrafusion on October 14, 2020 500 units, July 26 500 units, December 13 500/December/27 500 units, next infusion will be on May 30/June 13, ? ?Patient reported that when he is taking prednisone along with intra fusion, he does better, but understand the potential long-term side effect with prednisone, he has diabetes, use insulin shots ? ?He complains of frequent painful blister broke out at the perioral area, was treated with Valtrex as needed ? ?In addition, he complains of extreme stress at home, Cymbalta 60 mg daily has helped his body achy pain and depression, and work part-time job as an Stage manager  for [employer], feel fulfilled by helping people and animals.  He still have significant dry mouth, intermittent oblique double vision, ? ? ?PHYSICAL EXAM: ?  ?Vitals:  ? 07/03/21 1315  ?BP: (!) 165/79  ?Pulse: 63  ?Weight: 222 lb (100.7 kg)  ?Height: '5\' 10"'$  (1.778 m)  ? ?Not recorded ?  ? ? ?Body mass index is 31.85 kg/m?. ? ?PHYSICAL EXAMNIATION: ? ?Gen: NAD, conversant, well nourised, well groomed                     ?Cardiovascular: Regular  rate rhythm, no peripheral edema, warm, nontender. ?Eyes: Conjunctivae clear without exudates or hemorrhage ?Neck: Supple, no carotid bruits. ?Pulmonary: Clear to auscultation bilaterally  ? ?NEUROLOGICAL EXAM: ? ?MENTAL STATUS: ?Speech: ?   Speech is normal; fluent and spontaneous with normal comprehension.  ?Cognition: ?    Orientation to time, place and person ?    Normal recent and remote memory ?    Normal Attention span and concentration ?    Normal Language, naming, repeating,spontaneous speech ?    Fund of knowledge ?  ?CRANIAL NERVES: ?CN II: Visual fields are full to confrontation. Pupils are round equal and briskly reactive to light. ?CN III, IV, VI: extraocular movement are normal.  Mild left side fatigable ptosis, cover and uncover testing showed right superior exophoria/left exophoria, ?CN V: Facial sensation is intact to light touch ?CN VII: Face is symmetric with normal eye closure  ?CN VIII: Hearing is normal to causal conversation. ?CN IX, X: Phonation is normal. ?CN XI: Head turning and shoulder shrug are intact ?CN XII: Tongue movement is normal, has visible dry oral mucus ? ?MOTOR: ?There is no pronator drift of out-stretched arms. Muscle bulk and tone are normal. Muscle strength is normal. ? ?REFLEXES: ?Reflexes are absent and symmetric at the biceps, triceps, knees, and ankles. Plantar responses are flexor. ? ?SENSORY: ?Intact to light touch, pinprick and vibratory sensation are intact in fingers and toes. ? ?COORDINATION: ?There is no trunk or limb dysmetria noted. ? ?GAIT/STANCE: He can get up from seated position arm crossed ? ?REVIEW OF SYSTEMS:  ?Full 14 system review of systems performed and notable only for as above ?All other review of systems were negative. ? ? ?ALLERGIES: ?Allergies  ?Allergen Reactions  ? Statins Other (See Comments)  ?  MYALGIAS  ? Aminoglycosides   ? Botulinum Toxins   ? Calcium Channel Blockers   ? Macrolides And Ketolides   ? Quinine Derivatives   ? Vioxx  [Rofecoxib]   ?  UNSPECIFIED REACTION   ? Toradol [Ketorolac Tromethamine] Rash  ?  UNSPECIFIED REACTION   ? ? ?HOME MEDICATIONS: ?Current Outpatient Medications  ?Medication Sig Dispense Refill  ? ALPRAZolam (XANAX) 0.5 MG tablet TAKE (1) TABLET THREE TIMES DAILY AS NEEDED. 90 tablet 3  ? Coenzyme Q10 (CO Q-10) 200 MG CAPS Take 200 mg by mouth daily.    ? DULoxetine (CYMBALTA) 30 MG capsule TAKE (2) CAPSULES DAILY. 180 capsule 3  ? ezetimibe (ZETIA) 10 MG tablet Take 10 mg by mouth daily.    ? FARXIGA 10 MG TABS tablet Take 10 mg by mouth daily.    ? FIRDAPSE 10 MG TABS Take 80 mg by mouth daily.    ? HYDROcodone-acetaminophen (NORCO/VICODIN) 5-325 MG tablet TAKE (1) TABLET EVERY SIX HOURS AS NEEDED. MUST LAST 28 DAYS 60 tablet 0  ? LEVEMIR FLEXTOUCH 100 UNIT/ML FlexPen 10 Units daily.    ? levothyroxine (SYNTHROID) 137 MCG  tablet Take 137 mcg by mouth daily.    ? lisinopril (PRINIVIL,ZESTRIL) 5 MG tablet TAKE 1 TABLET (5 MG TOTAL) BY MOUTH DAILY. 30 tablet 0  ? Multiple Vitamin (MULTIVITAMIN WITH MINERALS) TABS tablet Take 1 tablet by mouth daily. One-A-Day    ? Omega-3 Fatty Acids (FISH OIL) 1200 MG CAPS Take 1,200 mg by mouth 3 (three) times daily.    ? pyridostigmine (MESTINON) 60 MG tablet TAKE  (1)  TABLET  THREE TIMES DAILY. 90 tablet 0  ? rosuvastatin (CRESTOR) 5 MG tablet SMARTSIG:0.5 Tablet(s) By Mouth 3 Times a Week    ? silver sulfADIAZINE (SILVADENE) 1 % cream Apply 1 application topically daily. 50 g 0  ? topiramate (TOPAMAX) 100 MG tablet Take 100 mg by mouth daily.    ? valACYclovir (VALTREX) 1000 MG tablet Take 1 tablet (1,000 mg total) by mouth 2 (two) times daily. 60 tablet 0  ? zolpidem (AMBIEN) 10 MG tablet TAKE 1 TABLET AT BEDTIME AS NEEDED FOR SLEEP 30 tablet 2  ? predniSONE (DELTASONE) 20 MG tablet Take 40 mg by mouth daily with breakfast. (Patient not taking: Reported on 07/03/2021)    ? ?No current facility-administered medications for this visit.  ? ? ?PAST MEDICAL HISTORY: ?Past Medical  History:  ?Diagnosis Date  ? Anomalous coronary artery origin   ? The patient underwent cardiac catheterization in 2004.  Coronaries revealed no stenosis.  However he had aberrant takeoff of both the right a

## 2021-07-06 LAB — CBC WITH DIFFERENTIAL/PLATELET
Basophils Absolute: 0 10*3/uL (ref 0.0–0.2)
Basos: 0 %
EOS (ABSOLUTE): 0.1 10*3/uL (ref 0.0–0.4)
Eos: 1 %
Hematocrit: 45.6 % (ref 37.5–51.0)
Hemoglobin: 15.6 g/dL (ref 13.0–17.7)
Immature Grans (Abs): 0 10*3/uL (ref 0.0–0.1)
Immature Granulocytes: 0 %
Lymphocytes Absolute: 1.4 10*3/uL (ref 0.7–3.1)
Lymphs: 20 %
MCH: 30.8 pg (ref 26.6–33.0)
MCHC: 34.2 g/dL (ref 31.5–35.7)
MCV: 90 fL (ref 79–97)
Monocytes Absolute: 0.8 10*3/uL (ref 0.1–0.9)
Monocytes: 12 %
Neutrophils Absolute: 4.5 10*3/uL (ref 1.4–7.0)
Neutrophils: 67 %
Platelets: 152 10*3/uL (ref 150–450)
RBC: 5.07 x10E6/uL (ref 4.14–5.80)
RDW: 13.6 % (ref 11.6–15.4)
WBC: 6.8 10*3/uL (ref 3.4–10.8)

## 2021-07-06 LAB — COMPREHENSIVE METABOLIC PANEL
ALT: 31 IU/L (ref 0–44)
AST: 26 IU/L (ref 0–40)
Albumin/Globulin Ratio: 2.4 — ABNORMAL HIGH (ref 1.2–2.2)
Albumin: 4.7 g/dL (ref 3.8–4.9)
Alkaline Phosphatase: 90 IU/L (ref 44–121)
BUN/Creatinine Ratio: 13 (ref 9–20)
BUN: 12 mg/dL (ref 6–24)
Bilirubin Total: 0.2 mg/dL (ref 0.0–1.2)
CO2: 27 mmol/L (ref 20–29)
Calcium: 9.7 mg/dL (ref 8.7–10.2)
Chloride: 100 mmol/L (ref 96–106)
Creatinine, Ser: 0.91 mg/dL (ref 0.76–1.27)
Globulin, Total: 2 g/dL (ref 1.5–4.5)
Glucose: 96 mg/dL (ref 70–99)
Potassium: 4.7 mmol/L (ref 3.5–5.2)
Sodium: 143 mmol/L (ref 134–144)
Total Protein: 6.7 g/dL (ref 6.0–8.5)
eGFR: 98 mL/min/{1.73_m2} (ref >59)

## 2021-07-06 LAB — CD19 AND CD20, FLOW CYTOMETRY

## 2021-07-06 LAB — CK: Total CK: 151 U/L (ref 41–331)

## 2021-07-06 LAB — IGG, IGA, IGM
IgA/Immunoglobulin A, Serum: 279 mg/dL (ref 90–386)
IgG (Immunoglobin G), Serum: 681 mg/dL (ref 603–1613)
IgM (Immunoglobulin M), Srm: 59 mg/dL (ref 20–172)

## 2021-07-06 LAB — HGB A1C W/O EAG: Hgb A1c MFr Bld: 6.6 % — ABNORMAL HIGH (ref 4.8–5.6)

## 2021-07-06 LAB — TSH: TSH: 4.25 u[IU]/mL (ref 0.450–4.500)

## 2021-07-10 DIAGNOSIS — H40012 Open angle with borderline findings, low risk, left eye: Secondary | ICD-10-CM | POA: Diagnosis not present

## 2021-07-10 DIAGNOSIS — H31092 Other chorioretinal scars, left eye: Secondary | ICD-10-CM | POA: Diagnosis not present

## 2021-07-10 DIAGNOSIS — E119 Type 2 diabetes mellitus without complications: Secondary | ICD-10-CM | POA: Diagnosis not present

## 2021-07-10 DIAGNOSIS — H2513 Age-related nuclear cataract, bilateral: Secondary | ICD-10-CM | POA: Diagnosis not present

## 2021-07-10 DIAGNOSIS — H04123 Dry eye syndrome of bilateral lacrimal glands: Secondary | ICD-10-CM | POA: Diagnosis not present

## 2021-07-10 DIAGNOSIS — G43109 Migraine with aura, not intractable, without status migrainosus: Secondary | ICD-10-CM | POA: Diagnosis not present

## 2021-07-30 DIAGNOSIS — E1165 Type 2 diabetes mellitus with hyperglycemia: Secondary | ICD-10-CM | POA: Diagnosis not present

## 2021-07-30 DIAGNOSIS — E7800 Pure hypercholesterolemia, unspecified: Secondary | ICD-10-CM | POA: Diagnosis not present

## 2021-07-30 DIAGNOSIS — E890 Postprocedural hypothyroidism: Secondary | ICD-10-CM | POA: Diagnosis not present

## 2021-08-06 ENCOUNTER — Ambulatory Visit (INDEPENDENT_AMBULATORY_CARE_PROVIDER_SITE_OTHER): Payer: Medicare Other | Admitting: Ophthalmology

## 2021-08-06 ENCOUNTER — Encounter (INDEPENDENT_AMBULATORY_CARE_PROVIDER_SITE_OTHER): Payer: Self-pay | Admitting: Ophthalmology

## 2021-08-06 DIAGNOSIS — H35412 Lattice degeneration of retina, left eye: Secondary | ICD-10-CM | POA: Diagnosis not present

## 2021-08-06 DIAGNOSIS — H33022 Retinal detachment with multiple breaks, left eye: Secondary | ICD-10-CM | POA: Insufficient documentation

## 2021-08-06 DIAGNOSIS — Z789 Other specified health status: Secondary | ICD-10-CM | POA: Diagnosis not present

## 2021-08-06 DIAGNOSIS — H2512 Age-related nuclear cataract, left eye: Secondary | ICD-10-CM | POA: Insufficient documentation

## 2021-08-06 DIAGNOSIS — E291 Testicular hypofunction: Secondary | ICD-10-CM | POA: Diagnosis not present

## 2021-08-06 DIAGNOSIS — H35411 Lattice degeneration of retina, right eye: Secondary | ICD-10-CM

## 2021-08-06 DIAGNOSIS — H33102 Unspecified retinoschisis, left eye: Secondary | ICD-10-CM | POA: Diagnosis not present

## 2021-08-06 DIAGNOSIS — R591 Generalized enlarged lymph nodes: Secondary | ICD-10-CM | POA: Diagnosis not present

## 2021-08-06 DIAGNOSIS — E890 Postprocedural hypothyroidism: Secondary | ICD-10-CM | POA: Diagnosis not present

## 2021-08-06 DIAGNOSIS — I10 Essential (primary) hypertension: Secondary | ICD-10-CM | POA: Diagnosis not present

## 2021-08-06 DIAGNOSIS — E1165 Type 2 diabetes mellitus with hyperglycemia: Secondary | ICD-10-CM | POA: Diagnosis not present

## 2021-08-06 DIAGNOSIS — H4312 Vitreous hemorrhage, left eye: Secondary | ICD-10-CM | POA: Diagnosis not present

## 2021-08-06 DIAGNOSIS — E7800 Pure hypercholesterolemia, unspecified: Secondary | ICD-10-CM | POA: Diagnosis not present

## 2021-08-06 DIAGNOSIS — R682 Dry mouth, unspecified: Secondary | ICD-10-CM | POA: Diagnosis not present

## 2021-08-06 DIAGNOSIS — H33012 Retinal detachment with single break, left eye: Secondary | ICD-10-CM

## 2021-08-06 DIAGNOSIS — H2513 Age-related nuclear cataract, bilateral: Secondary | ICD-10-CM | POA: Diagnosis not present

## 2021-08-06 DIAGNOSIS — G7000 Myasthenia gravis without (acute) exacerbation: Secondary | ICD-10-CM | POA: Diagnosis not present

## 2021-08-06 HISTORY — DX: Age-related nuclear cataract, left eye: H25.12

## 2021-08-06 NOTE — Assessment & Plan Note (Signed)
Follow-up with Dr. Katy Fitch as scheduled ?

## 2021-08-06 NOTE — Assessment & Plan Note (Signed)
Inferior 6:00 meridian no breaks at this time ?

## 2021-08-06 NOTE — Assessment & Plan Note (Addendum)
Inferotemporal pigmentary change signify significant vitreous tractional continue to monitor ?

## 2021-08-06 NOTE — Assessment & Plan Note (Signed)
Monitor inferiorly this suggest ongoing vitreal retinal traction ?

## 2021-08-06 NOTE — Assessment & Plan Note (Signed)
Shallow fluid around a large retinal break inferonasal OS, with accompanying vitreous hemorrhage from a traversing retinal vein. ? ?I explained the need for laser retinopexy to halt progression of the shallow retinal detachment. ? ?I also explained that the central vitreous floaters will take time to slowly go away, if other tears develop reassessment will be undertaken. ?

## 2021-08-06 NOTE — Progress Notes (Signed)
? ? ?08/06/2021 ? ?  ? ?CHIEF COMPLAINT ?Patient presents for  ?Chief Complaint  ?Patient presents with  ? Retina Evaluation  ? ? ? ? ?HISTORY OF PRESENT ILLNESS: ?[name] is a 61 y.o. male who presents to the clinic today for:  ? ?HPI   ? ? Retina Evaluation   ? ?      ? Laterality: left eye  ? Onset: 3 days ago  ? Associated Symptoms: Flashes and Floaters  ? ?  ?  ? ? Comments   ?NP- Retinal Tear, Referred by Dr. Wyatt Portela. ?Patient states "I can't see out of the left eye, I have a bunch of squiggly lines. It was just a floater. It started Monday. Yesterday it got worse. I was seen by Dr. Katy Fitch March 30th and everything was fine." ? ? ? ? ?  ?  ?Last edited by Hurman Horn, MD on 08/06/2021 12:23 PM.  ?  ? ? ?Referring physician: ?Debbra Riding, MD ?[address] ?[address] ?[place],  [place] [id number] ? ?HISTORICAL INFORMATION:  ? ?Selected notes from the [facility] ?  ? ?Lab Results  ?Component Value Date  ? HGBA1C 6.6 (H) 07/03/2021  ?  ? ?CURRENT MEDICATIONS: ?No current outpatient medications on file. (Ophthalmic Drugs)  ? ?No current facility-administered medications for this visit. (Ophthalmic Drugs)  ? ?Current Outpatient Medications (Other)  ?Medication Sig  ? ALPRAZolam (XANAX) 0.5 MG tablet TAKE (1) TABLET THREE TIMES DAILY AS NEEDED.  ? celecoxib (CELEBREX) 100 MG capsule Take 1 capsule (100 mg total) by mouth 2 (two) times daily as needed.  ? Coenzyme Q10 (CO Q-10) 200 MG CAPS Take 200 mg by mouth daily.  ? DULoxetine (CYMBALTA) 30 MG capsule Take 1 capsule (30 mg total) by mouth daily.  ? DULoxetine (CYMBALTA) 60 MG capsule Take 1 capsule (60 mg total) by mouth daily.  ? ezetimibe (ZETIA) 10 MG tablet Take 10 mg by mouth daily.  ? FARXIGA 10 MG TABS tablet Take 10 mg by mouth daily.  ? FIRDAPSE 10 MG TABS Take 80 mg by mouth daily.  ? HYDROcodone-acetaminophen (NORCO/VICODIN) 5-325 MG tablet TAKE (1) TABLET EVERY SIX HOURS AS NEEDED. MUST LAST 28 DAYS  ? LEVEMIR FLEXTOUCH 100 UNIT/ML  FlexPen 10 Units daily.  ? levothyroxine (SYNTHROID) 137 MCG tablet Take 137 mcg by mouth daily.  ? lisinopril (PRINIVIL,ZESTRIL) 5 MG tablet TAKE 1 TABLET (5 MG TOTAL) BY MOUTH DAILY.  ? Multiple Vitamin (MULTIVITAMIN WITH MINERALS) TABS tablet Take 1 tablet by mouth daily. One-A-Day  ? Omega-3 Fatty Acids (FISH OIL) 1200 MG CAPS Take 1,200 mg by mouth 3 (three) times daily.  ? predniSONE (DELTASONE) 20 MG tablet Take 40 mg by mouth daily with breakfast. (Patient not taking: Reported on 07/03/2021)  ? pyridostigmine (MESTINON) 60 MG tablet Take 1 tablet (60 mg total) by mouth 3 (three) times daily.  ? rosuvastatin (CRESTOR) 5 MG tablet SMARTSIG:0.5 Tablet(s) By Mouth 3 Times a Week  ? silver sulfADIAZINE (SILVADENE) 1 % cream Apply 1 application topically daily.  ? valACYclovir (VALTREX) 1000 MG tablet Take 1 tablet (1,000 mg total) by mouth 2 (two) times daily.  ? zolpidem (AMBIEN) 10 MG tablet TAKE 1 TABLET AT BEDTIME AS NEEDED FOR SLEEP  ? ?No current facility-administered medications for this visit. (Other)  ? ? ? ? ?REVIEW OF SYSTEMS: ?ROS   ?Negative for: Constitutional, Gastrointestinal, Neurological, Skin, Genitourinary, Musculoskeletal, HENT, Endocrine, Cardiovascular, Eyes, Respiratory, Psychiatric, Allergic/Imm, Heme/Lymph ?Last edited  by Hurman Horn, MD on 08/06/2021 12:23 PM.  ?  ? ? ? ?ALLERGIES ?Allergies  ?Allergen Reactions  ? Statins Other (See Comments)  ?  MYALGIAS  ? Aminoglycosides   ? Botulinum Toxins   ? Calcium Channel Blockers   ? Macrolides And Ketolides   ? Quinine Derivatives   ? Vioxx [Rofecoxib]   ?  UNSPECIFIED REACTION   ? Toradol [Ketorolac Tromethamine] Rash  ?  UNSPECIFIED REACTION   ? ? ?PAST MEDICAL HISTORY ?Past Medical History:  ?Diagnosis Date  ? Anomalous coronary artery origin   ? The patient underwent cardiac catheterization in 2004.  Coronaries revealed no stenosis.  However he had aberrant takeoff of both the right and left coronaries from the anterior aortic cusp.   There was no evidence of renal artery stenosis  ? Arthritis   ? Back pain   ? Broken fibula   ? right  ? Depression   ? Diabetes mellitus without complication ([place])   ? Dyslipidemia   ? Dyslipidemia   ? Ejection fraction   ? EF 60-65%, echo, March 18, 2011  ? H/O radioactive iodine thyroid ablation   ? History of plasmapheresis   ? Hypertension   ? Hyperthyroidism   ? November, 201 to  ? Lambert-Eaton myasthenic syndrome ([place])   ? Followed at [facility]. and Dr. Floyde Parkins  ? [name] ([place])   ? Murmur   ? Systolic murmur, December, 2012,No valvular abnormalities, echo, December, 2012  ? Obesity   ? Ocular migraine 11/30/2019  ? Pre-syncope   ? February 23, 2011  ? QT interval   ? Question prolongation QT interval, October, 2012, review of EKG dated February 04, 2011, QT interval was not prolonged  ? ?Past Surgical History:  ?Procedure Laterality Date  ? KNEE SURGERY Right   ? PORT-A-CATH REMOVAL N/A 11/12/2017  ? Procedure: REMOVAL PORT-A-CATH;  Surgeon: Clovis Riley, MD;  Location: [facility];  Service: General;  Laterality: N/A;  ? PORTACATH PLACEMENT    ? TONSILLECTOMY    ? ? ?FAMILY HISTORY ?Family History  ?Problem Relation Age of Onset  ? Hypertension Mother   ? Fibromyalgia Mother   ? Cirrhosis Father   ? Autoimmune disease Sister   ? Hypertension Brother   ? Cirrhosis Maternal Grandmother   ?     non alcoholic  ? Heart disease Other   ? Stroke Other   ? Cancer Other   ? Healthy Son   ? Healthy Son   ? ? ?SOCIAL HISTORY ?Social History  ? ?Tobacco Use  ? Smoking status: Never  ? Smokeless tobacco: Never  ?Vaping Use  ? Vaping Use: Never used  ?Substance Use Topics  ? Alcohol use: Not Currently  ?  Alcohol/week: 0.0 standard drinks  ? Drug use: No  ? ?  ? ?  ? ?OPHTHALMIC EXAM: ? ?Base Eye Exam   ? ? Visual Acuity (ETDRS)   ? ?   Right Left  ? Dist cc 20/25 -2 20/80 -2  ? Dist ph cc  20/40  ? ? Correction: Glasses  ? ?  ?  ? ? Pupils   ? ?   Dark APD  ? Right Pharma. Dilated None  ? Left Pharma.  Dilated None  ? ?  ?  ? ? Extraocular Movement   ? ?   Right Left  ?  Full Full  ? ?  ?  ? ? Neuro/Psych   ? ? Oriented x3: Yes  ?  Mood/Affect: Normal  ? ?  ?  ? ? Dilation   ? ? Both eyes: 1.0% Mydriacyl, 2.5% Phenylephrine @ 12:25 PM  ? ?  ?  ? ?  ? ?Slit Lamp and Fundus Exam   ? ? External Exam   ? ?   Right Left  ? External Normal Normal  ? ?  ?  ? ? Slit Lamp Exam   ? ?   Right Left  ? Lids/Lashes Normal Normal  ? Conjunctiva/Sclera White and quiet White and quiet  ? Cornea Clear Clear  ? Anterior Chamber Deep and quiet Deep and quiet  ? Iris Round and reactive Round and reactive  ? Lens 2+ Nuclear sclerosis 2+ Nuclear sclerosis  ? Anterior Vitreous Normal Normal  ? ?  ?  ? ? Fundus Exam   ? ?   Right Left  ? Posterior Vitreous Normal Vitreous hemorrhage appears to be emanating from a vessel which traverses the large retinal tear inferonasal, centered at 8:00 meridian, Central vitreous floaters  ? Disc Normal Normal  ? C/D Ratio 0.25 0.25  ? Macula Normal Normal  ? Vessels Normal Traversing vessel over retinal break inferonasal  ? Periphery Lattice degeneration centered at 530 meridian inferiorly Large inferonasal break, as centered at 8:00 with thin subretinal fluid around this region.  Worsening retinal veinInferior from 5-6 30, separate area of retinoschisis., Another area centered between 330 and 430 at the equator of pigmentary degeneration that looks like Form of lattice degeneration  ? ?  ?  ? ?  ? ? ?IMAGING AND PROCEDURES  ?Imaging and Procedures for 08/06/21 ? ?Color Fundus Photography Optos - OU - Both Eyes   ? ?   ?Right Eye ?Progression has no prior data. Disc findings include normal observations. Macula : normal observations. Vessels : normal observations. Periphery : normal observations.  ? ?Left Eye ?Progression has no prior data. Disc findings include normal observations. Macula : normal observations.  ? ?Notes ?Large retinal break inferonasal, centered at 8:00, traversing retinal vein, because  of the vitreous hemorrhage seen centrally lattice degeneration from temporally OS ? ?OD, area of lattice degeneration inferiorly, not visualized in the lashes ? ?  ? ?Repair Retinal Detach, Photocoag - OS - Lef

## 2021-08-11 ENCOUNTER — Encounter (INDEPENDENT_AMBULATORY_CARE_PROVIDER_SITE_OTHER): Payer: BC Managed Care – PPO | Admitting: Ophthalmology

## 2021-08-12 ENCOUNTER — Ambulatory Visit (INDEPENDENT_AMBULATORY_CARE_PROVIDER_SITE_OTHER): Payer: Medicare Other | Admitting: Ophthalmology

## 2021-08-12 ENCOUNTER — Encounter (INDEPENDENT_AMBULATORY_CARE_PROVIDER_SITE_OTHER): Payer: Self-pay | Admitting: Ophthalmology

## 2021-08-12 DIAGNOSIS — H33012 Retinal detachment with single break, left eye: Secondary | ICD-10-CM | POA: Diagnosis not present

## 2021-08-12 DIAGNOSIS — H4312 Vitreous hemorrhage, left eye: Secondary | ICD-10-CM

## 2021-08-12 MED ORDER — OFLOXACIN 0.3 % OP SOLN: 1.0000 [drp] | Freq: Four times a day (QID) | OPHTHALMIC | 0 refills | Status: AC

## 2021-08-12 MED ORDER — PREDNISOLONE ACETATE 1 % OP SUSP: 1.0000 [drp] | Freq: Four times a day (QID) | OPHTHALMIC | 0 refills | Status: AC

## 2021-08-12 NOTE — Progress Notes (Signed)
? ? ?08/12/2021 ? ?  ? ?CHIEF COMPLAINT ?Patient presents for  ?Chief Complaint  ?Patient presents with  ? Retina Follow Up  ? ? ? ? ?HISTORY OF PRESENT ILLNESS: ?[name] is a 61 y.o. male who presents to the clinic today for:  ? ?HPI   ? ? Retina Follow Up   ? ?      ? Diagnosis: Retinal Break/Detachment  ? Laterality: left eye  ? Onset: 5 days ago  ? Severity: mild  ? ?  ?  ? ? Comments   ?5 days dilate OS, Color FP. ?Patient states "it is still hazy, same as it was. I still have the big clump of floaters in the center of my vision." Patient reports new flashes of light. "Sunday night I got up in the middle of the night and when I stood up I could see a couple flashes of light. On the way down here I saw some. I even see it when my eyes are closed." ? ?  ?  ?Last edited by Laurin Coder on 08/12/2021  8:23 AM.  ?  ? ? ?Referring physician: ?Wannetta Sender, FNP ?[facility] ?[address] ?[place],  [place] [address] ? ?HISTORICAL INFORMATION:  ? ?Selected notes from the [facility] ?  ? ?Lab Results  ?Component Value Date  ? HGBA1C 6.6 (H) 07/03/2021  ?  ? ?CURRENT MEDICATIONS: ?No current outpatient medications on file. (Ophthalmic Drugs)  ? ?No current facility-administered medications for this visit. (Ophthalmic Drugs)  ? ?Current Outpatient Medications (Other)  ?Medication Sig  ? ALPRAZolam (XANAX) 0.5 MG tablet TAKE (1) TABLET THREE TIMES DAILY AS NEEDED.  ? celecoxib (CELEBREX) 100 MG capsule Take 1 capsule (100 mg total) by mouth 2 (two) times daily as needed.  ? Coenzyme Q10 (CO Q-10) 200 MG CAPS Take 200 mg by mouth daily.  ? DULoxetine (CYMBALTA) 30 MG capsule Take 1 capsule (30 mg total) by mouth daily.  ? DULoxetine (CYMBALTA) 60 MG capsule Take 1 capsule (60 mg total) by mouth daily.  ? ezetimibe (ZETIA) 10 MG tablet Take 10 mg by mouth daily.  ? FARXIGA 10 MG TABS tablet Take 10 mg by mouth daily.  ? FIRDAPSE 10 MG TABS Take 80 mg by mouth daily.   ? HYDROcodone-acetaminophen (NORCO/VICODIN) 5-325 MG tablet TAKE (1) TABLET EVERY SIX HOURS AS NEEDED. MUST LAST 28 DAYS  ? LEVEMIR FLEXTOUCH 100 UNIT/ML FlexPen 10 Units daily.  ? levothyroxine (SYNTHROID) 137 MCG tablet Take 137 mcg by mouth daily.  ? lisinopril (PRINIVIL,ZESTRIL) 5 MG tablet TAKE 1 TABLET (5 MG TOTAL) BY MOUTH DAILY.  ? Multiple Vitamin (MULTIVITAMIN WITH MINERALS) TABS tablet Take 1 tablet by mouth daily. One-A-Day  ? Omega-3 Fatty Acids (FISH OIL) 1200 MG CAPS Take 1,200 mg by mouth 3 (three) times daily.  ? predniSONE (DELTASONE) 20 MG tablet Take 40 mg by mouth daily with breakfast. (Patient not taking: Reported on 07/03/2021)  ? pyridostigmine (MESTINON) 60 MG tablet Take 1 tablet (60 mg total) by mouth 3 (three) times daily.  ? rosuvastatin (CRESTOR) 5 MG tablet SMARTSIG:0.5 Tablet(s) By Mouth 3 Times a Week  ? silver sulfADIAZINE (SILVADENE) 1 % cream Apply 1 application topically daily.  ? valACYclovir (VALTREX) 1000 MG tablet Take 1 tablet (1,000 mg total) by mouth 2 (two) times daily.  ? zolpidem (AMBIEN) 10 MG tablet TAKE 1 TABLET AT BEDTIME AS NEEDED FOR SLEEP  ? ?No  current facility-administered medications for this visit. (Other)  ? ? ? ? ?REVIEW OF SYSTEMS: ?ROS   ?Negative for: Constitutional, Gastrointestinal, Neurological, Skin, Genitourinary, Musculoskeletal, HENT, Endocrine, Cardiovascular, Eyes, Respiratory, Psychiatric, Allergic/Imm, Heme/Lymph ?Last edited by Hurman Horn, MD on 08/12/2021  9:13 AM.  ?  ? ? ? ?ALLERGIES ?Allergies  ?Allergen Reactions  ? Statins Other (See Comments)  ?  MYALGIAS  ? Aminoglycosides   ? Botulinum Toxins   ? Calcium Channel Blockers   ? Macrolides And Ketolides   ? Quinine Derivatives   ? Vioxx [Rofecoxib]   ?  UNSPECIFIED REACTION   ? Toradol [Ketorolac Tromethamine] Rash  ?  UNSPECIFIED REACTION   ? ? ?PAST MEDICAL HISTORY ?Past Medical History:  ?Diagnosis Date  ? Anomalous coronary artery origin   ? The patient underwent cardiac  catheterization in 2004.  Coronaries revealed no stenosis.  However he had aberrant takeoff of both the right and left coronaries from the anterior aortic cusp.  There was no evidence of renal artery stenosis  ? Arthritis   ? Back pain   ? Broken fibula   ? right  ? Depression   ? Diabetes mellitus without complication ([place])   ? Dyslipidemia   ? Dyslipidemia   ? Ejection fraction   ? EF 60-65%, echo, March 18, 2011  ? H/O radioactive iodine thyroid ablation   ? History of plasmapheresis   ? Hypertension   ? Hyperthyroidism   ? November, 201 to  ? 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 Followed at [facility]. and Dr. Floyde Parkins  ? [name] ([place])   ? Murmur   ? Systolic murmur, December, 2012,No valvular abnormalities, echo, December, 2012  ? Obesity   ? Ocular migraine 11/30/2019  ? Pre-syncope   ? February 23, 2011  ? QT interval   ? Question prolongation QT interval, October, 2012, review of EKG dated February 04, 2011, QT interval was not prolonged  ? ?Past Surgical History:  ?Procedure Laterality Date  ? KNEE SURGERY Right   ? PORT-A-CATH REMOVAL N/A 11/12/2017  ? Procedure: REMOVAL PORT-A-CATH;  Surgeon: Clovis Riley, MD;  Location: [facility];  Service: General;  Laterality: N/A;  ? PORTACATH PLACEMENT    ? TONSILLECTOMY    ? ? ?FAMILY HISTORY ?Family History  ?Problem Relation Age of Onset  ? Hypertension Mother   ? Fibromyalgia Mother   ? Cirrhosis Father   ? Autoimmune disease Sister   ? Hypertension Brother   ? Cirrhosis Maternal Grandmother   ?     non alcoholic  ? Heart disease Other   ? Stroke Other   ? Cancer Other   ? Healthy Son   ? Healthy Son   ? ? ?SOCIAL HISTORY ?Social History  ? ?Tobacco Use  ? Smoking status: Never  ? Smokeless tobacco: Never  ?Vaping Use  ? Vaping Use: Never used  ?Substance Use Topics  ? Alcohol use: Not Currently  ?  Alcohol/week: 0.0 standard drinks  ? Drug use: No  ? ?  ? ?  ? ?OPHTHALMIC EXAM: ? ?Base Eye Exam   ? ? Visual Acuity (ETDRS)   ? ?   Right Left  ?  Dist cc 20/20 -2 20/80 -1  ? Dist ph cc  20/40  ? ? Correction: Glasses  ? ?  ?  ? ? Tonometry (Tonopen, 8:27 AM)   ? ?   Right Left  ? Pressure 12 6  ? ?  ?  ? ?  Pupils   ? ?   Pupils APD  ? Right PERRL None  ? Left PERRL None  ? ?  ?  ? ? Extraocular Movement   ? ?   Right Left  ?  Full Full  ? ?  ?  ? ? Neuro/Psych   ? ? Oriented x3: Yes  ? Mood/Affect: Normal  ? ?  ?  ? ? Dilation   ? ? Left eye: 1.0% Mydriacyl, 2.5% Phenylephrine @ 8:27 AM  ? ?  ?  ? ?  ? ?Slit Lamp and Fundus Exam   ? ? External Exam   ? ?   Right Left  ? External Normal Normal  ? ?  ?  ? ? Slit Lamp Exam   ? ?   Right Left  ? Lids/Lashes Normal Normal  ? Conjunctiva/Sclera White and quiet White and quiet  ? Cornea Clear Clear  ? Anterior Chamber Deep and quiet Deep and quiet  ? Iris Round and reactive Round and reactive  ? Lens 2+ Nuclear sclerosis 2+ Nuclear sclerosis  ? Anterior Vitreous Normal Normal  ? ?  ?  ? ? Fundus Exam   ? ?   Right Left  ? Posterior Vitreous  Vitreous hemorrhage appears to be emanating from a vessel which traverses the large retinal tear inferonasal, centered at 8:00 meridian, Central vitreous floaters, now increased from 1+ to 3+ centrally  ? Disc  Normal  ? C/D Ratio  0.25  ? Macula  Normal  ? Vessels  Traversing vessel over retinal break inferonasal  ? Periphery  Large inferonasal break, as centered at 8:00 with thin subretinal fluid around this region.  Now with good retinopexy, 3 rows.Inferior from 5-6 30, separate area of retinoschisis., Another area centered between 330 and 430 at the equator of pigmentary degeneration that looks like Form of lattice degeneration, with new regional rd inferiorly from 3 30-7 meridian inferiorly, no clear attachment to the retinopexy site inferonasal  ? ?  ?  ? ?  ? ? ?IMAGING AND PROCEDURES  ?Imaging and Procedures for 08/12/21 ? ?Color Fundus Photography Optos - OU - Both Eyes   ? ?   ?Right Eye ?Progression has no prior data. Disc findings include normal observations. Macula  : normal observations. Vessels : normal observations. Periphery : normal observations.  ? ?Left Eye ?Progression has no prior data. Disc findings include normal observations. Macula : normal observations.  ?

## 2021-08-13 ENCOUNTER — Encounter (AMBULATORY_SURGERY_CENTER): Payer: Medicare Other | Admitting: Ophthalmology

## 2021-08-13 DIAGNOSIS — H33022 Retinal detachment with multiple breaks, left eye: Secondary | ICD-10-CM

## 2021-08-13 DIAGNOSIS — H4312 Vitreous hemorrhage, left eye: Secondary | ICD-10-CM

## 2021-08-14 ENCOUNTER — Ambulatory Visit (INDEPENDENT_AMBULATORY_CARE_PROVIDER_SITE_OTHER): Payer: BC Managed Care – PPO | Admitting: Ophthalmology

## 2021-08-14 DIAGNOSIS — H4312 Vitreous hemorrhage, left eye: Secondary | ICD-10-CM

## 2021-08-14 DIAGNOSIS — H33022 Retinal detachment with multiple breaks, left eye: Secondary | ICD-10-CM

## 2021-08-14 NOTE — Patient Instructions (Addendum)
POST OP RETINAL DETACHMENT REPAIR, USE OF OIL- The operative eye has oil in place.  Do not sleep, rest flat on back for long periods of time, preferably, no longer than 10 minutes.  Sleep or rest on either side.  Travel may include to regions of elevation including mountains, or the coase.  Airline travel is permitted.  Dr. Lenell Mcconnell recommends the use of vitamin B complex supplement 1 tablet oral daily, which my experience decreases the likelihood of nutritional changes in the retinal nerve fiber layer, that is it prevents nerve damage from the silicone oil barrier to diffusion of critical vitamin B elements and nerve health, within the eye.  I recommend use of Super B complex, 1 tablet daily or once every other day, not as a component of a multivitamin.    Ofloxacin  4 times daily to the operative eye  Prednisolone acetate 1 drop to the operative eye 4 times daily  Patient instructed not to refill the medications and use them for maximum of 3 weeks.  Patient instructed do not rub the eye.  Patient has the option to use the patch at night.  

## 2021-08-14 NOTE — Assessment & Plan Note (Signed)
?  Found at surgery to have dense vitreous hemorrhage documented preoperatively inferiorly as well as central visual axis which covered the inferior retinal detachment but also a large retinal break centered at the 3 30-5 inferotemporally left eye as well as previous break noted at 730 and 7. ? ?Repair undertaken via vitrectomy, scleral buckle with 287 tire fixed inferior 180 degrees and encircling band.  Cryopexy applied.  Because of concerns regarding positioning issues, and preoperative discussion, intravitreal substitute chosen with silicone oil 6811 CS ? ?

## 2021-08-14 NOTE — Progress Notes (Signed)
? ? ?08/14/2021 ? ?  ? ?CHIEF COMPLAINT ?Patient presents for  ?Chief Complaint  ?Patient presents with  ? Post-op Follow-up  ? ? ? ? ?HISTORY OF PRESENT ILLNESS: ?[name] is a 61 y.o. male who presents to the clinic today for:  ? ?HPI   ? ? Post-op Follow-up   ? ?      ? Laterality: left eye  ? Discomfort: pain and itching.  Negative for foreign body sensation, tearing, discharge and floaters  ? Vision: is stable  ? ?  ?  ? ? Comments   ?1 Day post op OS SX 08/13/2021, status post repair of extensive retinal detachment inferiorly Macula on ? ?Found at surgery to have dense vitreous hemorrhage documented preoperatively inferiorly as well as central visual axis which covered the inferior retinal detachment but also a large retinal break centered at the 3 30-5 inferotemporally left eye as well as previous break noted at 730 and 7. ? ?Repair undertaken via vitrectomy, scleral buckle with 287 tire fixed inferior 180 degrees and encircling band.  Cryopexy applied.  Because of concerns regarding positioning issues, and preoperative discussion, intravitreal substitute chosen with silicone oil 7494 CS ?Pt stated having throbbing pain and described pain at a intense 10. ?Pt stated he did not sleep well last night. Pt reports intense pain. ? ? ? ? ? ?  ?  ?Last edited by Hurman Horn, MD on 08/14/2021  8:23 AM.  ?  ? ? ?Referring physician: ?Wannetta Sender, FNP ?[facility] ?[address] ?[place],   [address] ? ?HISTORICAL INFORMATION:  ? ?Selected notes from the [facility] ?  ? ?Lab Results  ?Component Value Date  ? HGBA1C 6.6 (H) 07/03/2021  ?  ? ?CURRENT MEDICATIONS: ?Current Outpatient Medications (Ophthalmic Drugs)  ?Medication Sig  ? ofloxacin (OCUFLOX) 0.3 % ophthalmic solution Place 1 drop into the left eye in the morning, at noon, in the evening, and at bedtime for 21 days.  ? prednisoLONE acetate (PRED FORTE) 1 % ophthalmic suspension Place 1 drop into the  left eye 4 (four) times daily for 21 doses.  ? ?No current facility-administered medications for this visit. (Ophthalmic Drugs)  ? ?Current Outpatient Medications (Other)  ?Medication Sig  ? ALPRAZolam (XANAX) 0.5 MG tablet TAKE (1) TABLET THREE TIMES DAILY AS NEEDED.  ? celecoxib (CELEBREX) 100 MG capsule Take 1 capsule (100 mg total) by mouth 2 (two) times daily as needed.  ? Coenzyme Q10 (CO Q-10) 200 MG CAPS Take 200 mg by mouth daily.  ? DULoxetine (CYMBALTA) 30 MG capsule Take 1 capsule (30 mg total) by mouth daily.  ? DULoxetine (CYMBALTA) 60 MG capsule Take 1 capsule (60 mg total) by mouth daily.  ? ezetimibe (ZETIA) 10 MG tablet Take 10 mg by mouth daily.  ? FARXIGA 10 MG TABS tablet Take 10 mg by mouth daily.  ? FIRDAPSE 10 MG TABS Take 80 mg by mouth daily.  ? HYDROcodone-acetaminophen (NORCO/VICODIN) 5-325 MG tablet TAKE (1) TABLET EVERY SIX HOURS AS NEEDED. MUST LAST 28 DAYS  ? LEVEMIR FLEXTOUCH 100 UNIT/ML FlexPen 10 Units daily.  ? levothyroxine (SYNTHROID) 137 MCG tablet Take 137 mcg by mouth daily.  ? lisinopril (PRINIVIL,ZESTRIL) 5 MG tablet TAKE 1 TABLET (5 MG TOTAL) BY MOUTH DAILY.  ? Multiple Vitamin (MULTIVITAMIN WITH MINERALS) TABS tablet Take 1 tablet by mouth daily. One-A-Day  ? Omega-3 Fatty Acids (FISH OIL) 1200 MG CAPS Take 1,200  mg by mouth 3 (three) times daily.  ? predniSONE (DELTASONE) 20 MG tablet Take 40 mg by mouth daily with breakfast. (Patient not taking: Reported on 07/03/2021)  ? pyridostigmine (MESTINON) 60 MG tablet Take 1 tablet (60 mg total) by mouth 3 (three) times daily.  ? rosuvastatin (CRESTOR) 5 MG tablet SMARTSIG:0.5 Tablet(s) By Mouth 3 Times a Week  ? silver sulfADIAZINE (SILVADENE) 1 % cream Apply 1 application topically daily.  ? valACYclovir (VALTREX) 1000 MG tablet Take 1 tablet (1,000 mg total) by mouth 2 (two) times daily.  ? zolpidem (AMBIEN) 10 MG tablet TAKE 1 TABLET AT BEDTIME AS NEEDED FOR SLEEP  ? ?No current facility-administered medications for this  visit. (Other)  ? ? ? ? ?REVIEW OF SYSTEMS: ?ROS   ?Negative for: Constitutional, Gastrointestinal, Neurological, Skin, Genitourinary, Musculoskeletal, HENT, Endocrine, Cardiovascular, Eyes, Respiratory, Psychiatric, Allergic/Imm, Heme/Lymph ?Last edited by Silvestre Moment on 08/14/2021  8:08 AM.  ?  ? ? ? ?ALLERGIES ?Allergies  ?Allergen Reactions  ? Statins Other (See Comments)  ?  MYALGIAS  ? Aminoglycosides   ? Botulinum Toxins   ? Calcium Channel Blockers   ? Macrolides And Ketolides   ? Quinine Derivatives   ? Vioxx [Rofecoxib]   ?  UNSPECIFIED REACTION   ? Toradol [Ketorolac Tromethamine] Rash  ?  UNSPECIFIED REACTION   ? ? ?PAST MEDICAL HISTORY ?Past Medical History:  ?Diagnosis Date  ? Anomalous coronary artery origin   ? The patient underwent cardiac catheterization in 2004.  Coronaries revealed no stenosis.  However he had aberrant takeoff of both the right and left coronaries from the anterior aortic cusp.  There was no evidence of renal artery stenosis  ? Arthritis   ? Back pain   ? Broken fibula   ? right  ? Depression   ? Diabetes mellitus without complication ([employer])   ? Dyslipidemia   ? Dyslipidemia   ? Ejection fraction   ? EF 60-65%, echo, March 18, 2011  ? H/O radioactive iodine thyroid ablation   ? History of plasmapheresis   ? Hypertension   ? Hyperthyroidism   ? November, 201 to  ? 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 Followed at [facility] and Dr. Floyde Parkins  ? [name] ([place])   ? Murmur   ? Systolic murmur, December, 2012,No valvular abnormalities, echo, December, 2012  ? Obesity   ? Ocular migraine 11/30/2019  ? Pre-syncope   ? February 23, 2011  ? QT interval   ? Question prolongation QT interval, October, 2012, review of EKG dated February 04, 2011, QT interval was not prolonged  ? ?Past Surgical History:  ?Procedure Laterality Date  ? KNEE SURGERY Right   ? PORT-A-CATH REMOVAL N/A 11/12/2017  ? Procedure: REMOVAL PORT-A-CATH;  Surgeon: Clovis Riley, MD;  Location: [facility];   Service: General;  Laterality: N/A;  ? PORTACATH PLACEMENT    ? TONSILLECTOMY    ? ? ?FAMILY HISTORY ?Family History  ?Problem Relation Age of Onset  ? Hypertension Mother   ? Fibromyalgia Mother   ? Cirrhosis Father   ? Autoimmune disease Sister   ? Hypertension Brother   ? Cirrhosis Maternal Grandmother   ?     non alcoholic  ? Heart disease Other   ? Stroke Other   ? Cancer Other   ? Healthy Son   ? Healthy Son   ? ? ?SOCIAL HISTORY ?Social History  ? ?Tobacco Use  ? Smoking status: Never  ? Smokeless tobacco: Never  ?  Vaping Use  ? Vaping Use: Never used  ?Substance Use Topics  ? Alcohol use: Not Currently  ?  Alcohol/week: 0.0 standard drinks  ? Drug use: No  ? ?  ? ?  ? ?OPHTHALMIC EXAM: ? ?Base Eye Exam   ? ? Visual Acuity (ETDRS)   ? ?   Right Left  ? Dist cc 20/20 -1 20/100  ? Dist ph cc  20/80  ? ? Correction: Glasses  ? ?  ?  ? ? Tonometry (Tonopen, 8:16 AM)   ? ?   Right Left  ? Pressure 10 17  ? ?  ?  ? ? Pupils   ? ?   Pupils APD  ? Right PERRL None  ? Left PERRL None  ? ?  ?  ? ? Visual Fields   ? ?   Left Right  ?  Full Full  ? ?  ?  ? ? Extraocular Movement   ? ?   Right Left  ?  Full Full  ? ?  ?  ? ? Neuro/Psych   ? ? Oriented x3: Yes  ? Mood/Affect: Normal  ? ?  ?  ? ? Dilation   ? ? Left eye: 2.5% Phenylephrine, 1.0% Mydriacyl @ 8:16 AM  ? ?  ?  ? ?  ? ?Slit Lamp and Fundus Exam   ? ? External Exam   ? ?   Right Left  ? External Normal Normal  ? ?  ?  ? ? Slit Lamp Exam   ? ?   Right Left  ? Lids/Lashes Normal Normal  ? Conjunctiva/Sclera White and quiet White and quiet  ? Cornea Clear Clear  ? Anterior Chamber Deep and quiet Deep and quiet  ? Iris Round and reactive Round and reactive  ? Lens 2+ Nuclear sclerosis 2+ Nuclear sclerosis  ? Anterior Vitreous Normal Normal  ? ?  ?  ? ? Fundus Exam   ? ?   Right Left  ? Posterior Vitreous  Clear, oil fill  ? Disc  Normal  ? C/D Ratio  0.25  ? Macula  Normal  ? Vessels  Normal  ? Periphery  Good buckling effect, large tire inferior, encircled,  ? ?  ?   ? ?  ? ? ?IMAGING AND PROCEDURES  ?Imaging and Procedures for 08/14/21 ? ? ? ?  ?  ? ?  ?ASSESSMENT/PLAN: ? ?New partial retinal detachment with multiple defects, left ? ?Found at surgery to have dense vitreo

## 2021-08-20 ENCOUNTER — Ambulatory Visit (INDEPENDENT_AMBULATORY_CARE_PROVIDER_SITE_OTHER): Payer: Medicare Other | Admitting: Ophthalmology

## 2021-08-20 ENCOUNTER — Encounter (INDEPENDENT_AMBULATORY_CARE_PROVIDER_SITE_OTHER): Payer: Self-pay | Admitting: Ophthalmology

## 2021-08-20 DIAGNOSIS — H33022 Retinal detachment with multiple breaks, left eye: Secondary | ICD-10-CM | POA: Diagnosis not present

## 2021-08-20 DIAGNOSIS — H4312 Vitreous hemorrhage, left eye: Secondary | ICD-10-CM

## 2021-08-20 NOTE — Progress Notes (Signed)
? ? ?08/20/2021 ? ?  ? ?CHIEF COMPLAINT ?Patient presents for  ?Chief Complaint  ?Patient presents with  ? Post-op Follow-up  ? ? ? ? ?HISTORY OF PRESENT ILLNESS: ?[name] is a 61 y.o. male who presents to the clinic today for:  ? ?HPI   ? ? Post-op Follow-up   ? ?      ? Laterality: left eye  ? Discomfort: pain, itching and floaters.  Negative for foreign body sensation, tearing and discharge  ? Vision: is improved and is stable  ? ?  ?  ? ? Comments   ?1 week for POST OP, COLOR FP, OCT. Sx 08/13/2021 ?Pt stated vision has improved. Pt reports, "I have been exercising my eye a little bit. Ive been trying to look at the T.V. I've been seeing floaters every once in awhile. Last night, I had bright pinhole of a light for a split second but then it went away." ? ?OS with itching post use of eyedrops most likely this is ofloxacin ?Pt is having pain in operative eye. ? ? ?  ?  ?Last edited by Hurman Horn, MD on 08/20/2021  9:51 AM.  ?  ? ? ?Referring physician: ?Wannetta Sender, FNP ?[facility] ?[address] ?[place],  [place] [address] ? ?HISTORICAL INFORMATION:  ? ?Selected notes from the [facility] ?  ? ?Lab Results  ?Component Value Date  ? HGBA1C 6.6 (H) 07/03/2021  ?  ? ?CURRENT MEDICATIONS: ?Current Outpatient Medications (Ophthalmic Drugs)  ?Medication Sig  ? ofloxacin (OCUFLOX) 0.3 % ophthalmic solution Place 1 drop into the left eye in the morning, at noon, in the evening, and at bedtime for 21 days.  ? ?No current facility-administered medications for this visit. (Ophthalmic Drugs)  ? ?Current Outpatient Medications (Other)  ?Medication Sig  ? ALPRAZolam (XANAX) 0.5 MG tablet TAKE (1) TABLET THREE TIMES DAILY AS NEEDED.  ? celecoxib (CELEBREX) 100 MG capsule Take 1 capsule (100 mg total) by mouth 2 (two) times daily as needed.  ? Coenzyme Q10 (CO Q-10) 200 MG CAPS Take 200 mg by mouth daily.  ? DULoxetine (CYMBALTA) 30 MG capsule Take 1 capsule (30 mg  total) by mouth daily.  ? DULoxetine (CYMBALTA) 60 MG capsule Take 1 capsule (60 mg total) by mouth daily.  ? ezetimibe (ZETIA) 10 MG tablet Take 10 mg by mouth daily.  ? FARXIGA 10 MG TABS tablet Take 10 mg by mouth daily.  ? FIRDAPSE 10 MG TABS Take 80 mg by mouth daily.  ? HYDROcodone-acetaminophen (NORCO/VICODIN) 5-325 MG tablet TAKE (1) TABLET EVERY SIX HOURS AS NEEDED. MUST LAST 28 DAYS  ? LEVEMIR FLEXTOUCH 100 UNIT/ML FlexPen 10 Units daily.  ? levothyroxine (SYNTHROID) 137 MCG tablet Take 137 mcg by mouth daily.  ? lisinopril (PRINIVIL,ZESTRIL) 5 MG tablet TAKE 1 TABLET (5 MG TOTAL) BY MOUTH DAILY.  ? Multiple Vitamin (MULTIVITAMIN WITH MINERALS) TABS tablet Take 1 tablet by mouth daily. One-A-Day  ? Omega-3 Fatty Acids (FISH OIL) 1200 MG CAPS Take 1,200 mg by mouth 3 (three) times daily.  ? predniSONE (DELTASONE) 20 MG tablet Take 40 mg by mouth daily with breakfast. (Patient not taking: Reported on 07/03/2021)  ? pyridostigmine (MESTINON) 60 MG tablet Take 1 tablet (60 mg total) by mouth 3 (three) times daily.  ? rosuvastatin (CRESTOR) 5 MG tablet SMARTSIG:0.5 Tablet(s) By Mouth 3 Times a Week  ? silver sulfADIAZINE (SILVADENE) 1 % cream Apply 1  application topically daily.  ? valACYclovir (VALTREX) 1000 MG tablet Take 1 tablet (1,000 mg total) by mouth 2 (two) times daily.  ? zolpidem (AMBIEN) 10 MG tablet TAKE 1 TABLET AT BEDTIME AS NEEDED FOR SLEEP  ? ?No current facility-administered medications for this visit. (Other)  ? ? ? ? ?REVIEW OF SYSTEMS: ?ROS   ?Negative for: Constitutional, Gastrointestinal, Neurological, Skin, Genitourinary, Musculoskeletal, HENT, Endocrine, Cardiovascular, Eyes, Respiratory, Psychiatric, Allergic/Imm, Heme/Lymph ?Last edited by Silvestre Moment on 08/20/2021  8:55 AM.  ?  ? ? ? ?ALLERGIES ?Allergies  ?Allergen Reactions  ? Statins Other (See Comments)  ?  MYALGIAS  ? Aminoglycosides   ? Botulinum Toxins   ? Calcium Channel Blockers   ? Macrolides And Ketolides   ? Quinine Derivatives    ? Vioxx [Rofecoxib]   ?  UNSPECIFIED REACTION   ? Ofloxacin Itching  ?  Topical ofloxacin eyedrops to the left eye with periocular itching.  ? Toradol [Ketorolac Tromethamine] Rash  ?  UNSPECIFIED REACTION   ? ? ?PAST MEDICAL HISTORY ?Past Medical History:  ?Diagnosis Date  ? Anomalous coronary artery origin   ? The patient underwent cardiac catheterization in 2004.  Coronaries revealed no stenosis.  However he had aberrant takeoff of both the right and left coronaries from the anterior aortic cusp.  There was no evidence of renal artery stenosis  ? Arthritis   ? Back pain   ? Broken fibula   ? right  ? Depression   ? Diabetes mellitus without complication ([place])   ? Dyslipidemia   ? Dyslipidemia   ? Ejection fraction   ? EF 60-65%, echo, March 18, 2011  ? H/O radioactive iodine thyroid ablation   ? History of plasmapheresis   ? Hypertension   ? Hyperthyroidism   ? November, 201 to  ? 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 Followed at [facility]. and Dr. Floyde Parkins  ? [name] ([place])   ? Murmur   ? Systolic murmur, December, 2012,No valvular abnormalities, echo, December, 2012  ? Obesity   ? Ocular migraine 11/30/2019  ? Pre-syncope   ? February 23, 2011  ? QT interval   ? Question prolongation QT interval, October, 2012, review of EKG dated February 04, 2011, QT interval was not prolonged  ? ?Past Surgical History:  ?Procedure Laterality Date  ? KNEE SURGERY Right   ? PORT-A-CATH REMOVAL N/A 11/12/2017  ? Procedure: REMOVAL PORT-A-CATH;  Surgeon: Clovis Riley, MD;  Location: [facility];  Service: General;  Laterality: N/A;  ? PORTACATH PLACEMENT    ? TONSILLECTOMY    ? ? ?FAMILY HISTORY ?Family History  ?Problem Relation Age of Onset  ? Hypertension Mother   ? Fibromyalgia Mother   ? Cirrhosis Father   ? Autoimmune disease Sister   ? Hypertension Brother   ? Cirrhosis Maternal Grandmother   ?     non alcoholic  ? Heart disease Other   ? Stroke Other   ? Cancer Other   ? Healthy Son   ? Healthy Son    ? ? ?SOCIAL HISTORY ?Social History  ? ?Tobacco Use  ? Smoking status: Never  ? Smokeless tobacco: Never  ?Vaping Use  ? Vaping Use: Never used  ?Substance Use Topics  ? Alcohol use: Not Currently  ?  Alcohol/week: 0.0 standard drinks  ? Drug use: No  ? ?  ? ?  ? ?OPHTHALMIC EXAM: ? ?Base Eye Exam   ? ? Visual Acuity (ETDRS)   ? ?  Right Left  ? Dist cc 20/20 20/100  ? ? Correction: Glasses  ? ?  ?  ? ? Tonometry (Tonopen, 9:02 AM)   ? ?   Right Left  ? Pressure 5 6  ? ?  ?  ? ? Pupils   ? ?   Pupils APD  ? Right PERRL None  ? Left PERRL None  ? ?  ?  ? ? Visual Fields   ? ?   Left Right  ?  Full Full  ? ?  ?  ? ? Extraocular Movement   ? ?   Right Left  ?  Full Full  ? ?  ?  ? ? Neuro/Psych   ? ? Oriented x3: Yes  ? Mood/Affect: Normal  ? ?  ?  ? ? Dilation   ? ? Left eye: 2.5% Phenylephrine, 1.0% Mydriacyl @ 9:02 AM  ? ?  ?  ? ?  ? ?Slit Lamp and Fundus Exam   ? ? External Exam   ? ?   Right Left  ? External Normal Normal  ? ?  ?  ? ? Slit Lamp Exam   ? ?   Right Left  ? Lids/Lashes Normal Normal  ? Conjunctiva/Sclera White and quiet White and quiet  ? Cornea Clear Clear  ? Anterior Chamber Deep and quiet Deep and quiet  ? Iris Round and reactive Round and reactive  ? Lens 2+ Nuclear sclerosis 2+ Nuclear sclerosis  ? Anterior Vitreous Normal Normal  ? ?  ?  ? ? Fundus Exam   ? ?   Right Left  ? Posterior Vitreous  Clear, oil fill  ? Disc  Normal  ? C/D Ratio  0.25  ? Macula  Normal  ? Vessels  Normal  ? Periphery  Good buckling effect, large tire inferior, encircled,  ? ?  ?  ? ?  ? ? ?IMAGING AND PROCEDURES  ?Imaging and Procedures for 08/20/21 ? ?OCT, Retina - OU - Both Eyes   ? ?   ?Right Eye ?Quality was good. Scan locations included subfoveal. Central Foveal Thickness: 274. Progression has been stable. Findings include normal foveal contour.  ? ?Left Eye ?Quality was good. Scan locations included subfoveal. Central Foveal Thickness: 290. Progression has improved.  ? ?Notes ?OS, macula attached. ? ?   ? ?Color Fundus Photography Optos - OU - Both Eyes   ? ?   ?Right Eye ?Progression has no prior data. Disc findings include normal observations. Macula : normal observations. Vessels : normal observations. Perip

## 2021-08-20 NOTE — Assessment & Plan Note (Addendum)
OS, looks great.  Post vitrectomy, repair retinal detachment, clearance of vitreous hemorrhage, instillation of silicone oil positioning reviewed ?

## 2021-08-20 NOTE — Patient Instructions (Signed)
Patient to immediately discontinue ofloxacin to the left eye. ? ?Prednisolone acetate 1 drop to the operative eye 4 times daily ? ?Patient instructed not to refill the medications and use them for maximum of 3 weeks. ? ?Patient instructed do not rub the eye.  Patient has the option to use the patch at night.  ?

## 2021-08-21 DIAGNOSIS — G8929 Other chronic pain: Secondary | ICD-10-CM | POA: Diagnosis not present

## 2021-08-26 ENCOUNTER — Encounter: Payer: Self-pay | Admitting: Neurology

## 2021-08-27 ENCOUNTER — Telehealth: Payer: Self-pay | Admitting: Neurology

## 2021-08-27 NOTE — Telephone Encounter (Signed)
I have spoken to patient and provided him with the update. Information will also be given to [facility]. ?

## 2021-08-27 NOTE — Telephone Encounter (Signed)
Reviewed, patient was getting rituximab every 5 months (500 mg p.o. 500 mg in 2 weeks) for [name] ? ?He does reported wearing of at the end of previous infusion ? ?Also reviewed ophthalmology Dr. Dahlia Bailiff note, he was treated for left vitreous bleeding, retinal detachment ?Reviewed potential ophthalmology side effect for rituximabn micromedx, listed retinal macular edema, peripheral ulcerative keratitis, ? ?Should be okay to keep previous rituximab infusion scheduled ?

## 2021-08-27 NOTE — Telephone Encounter (Signed)
Okay to use rituximab, no impact on current ocular condition

## 2021-08-28 ENCOUNTER — Telehealth (INDEPENDENT_AMBULATORY_CARE_PROVIDER_SITE_OTHER): Payer: Self-pay

## 2021-08-28 NOTE — Telephone Encounter (Signed)
Patient called stating he is having intolerable dry eye symptoms. States he always has dry eye symptoms but it has been worse since surgery, and is especially worse in the surgery eye. Surgery was 08/13/21, 2 weeks out now.   I informed Dr. Zadie Rhine. Dr Zadie Rhine informed me to tell the patient he can use PF AT's.  Patient was informed and stated he wanted to clarify if he could use the dry eye drops he usually uses which he had stopped since surgery, I informed him it is okay to continue the artificial tears to help with the dry eyes per Dr. Zadie Rhine.

## 2021-09-04 ENCOUNTER — Encounter: Payer: Self-pay | Admitting: Neurology

## 2021-09-17 ENCOUNTER — Encounter (INDEPENDENT_AMBULATORY_CARE_PROVIDER_SITE_OTHER): Payer: Self-pay

## 2021-09-17 ENCOUNTER — Ambulatory Visit (INDEPENDENT_AMBULATORY_CARE_PROVIDER_SITE_OTHER): Payer: Medicare Other | Admitting: Ophthalmology

## 2021-09-17 ENCOUNTER — Encounter (INDEPENDENT_AMBULATORY_CARE_PROVIDER_SITE_OTHER): Payer: Self-pay | Admitting: Ophthalmology

## 2021-09-17 DIAGNOSIS — H33022 Retinal detachment with multiple breaks, left eye: Secondary | ICD-10-CM

## 2021-09-17 DIAGNOSIS — H4312 Vitreous hemorrhage, left eye: Secondary | ICD-10-CM

## 2021-09-17 NOTE — Progress Notes (Signed)
09/17/2021     CHIEF COMPLAINT Patient presents for  Chief Complaint  Patient presents with   Post-op Follow-up      HISTORY OF PRESENT ILLNESS: [name] is a 61 y.o. male who presents to the clinic today for:   HPI     Post-op Follow-up           Laterality: left eye   Discomfort: pain and itching.  Negative for foreign body sensation, tearing and discharge   Vision: is stable         Comments   4 weeks for DILATE, OS, POST OP, COLOR FP. Pt stated, "Depth perception is horrible. I did notice two little pin point dots, they're small but it is enough to grab my attention. In my operative eye, there is a line in between and the left side is black. On Monday, I started seeing flashes of light in my left eye."  Pt sees floaters and FOL. Pt stated vision is still hard to tell.       Last edited by Silvestre Moment on 09/17/2021  8:19 AM.      Referring physician: Wannetta Sender, FNP [facility] [address] [place],  [place] [address]  HISTORICAL INFORMATION:   Selected notes from the MEDICAL RECORD NUMBER    Lab Results  Component Value Date   HGBA1C 6.6 (H) 07/03/2021     CURRENT MEDICATIONS: No current outpatient medications on file. (Ophthalmic Drugs)   No current facility-administered medications for this visit. (Ophthalmic Drugs)   Current Outpatient Medications (Other)  Medication Sig   ALPRAZolam (XANAX) 0.5 MG tablet TAKE (1) TABLET THREE TIMES DAILY AS NEEDED.   celecoxib (CELEBREX) 100 MG capsule Take 1 capsule (100 mg total) by mouth 2 (two) times daily as needed.   Coenzyme Q10 (CO Q-10) 200 MG CAPS Take 200 mg by mouth daily.   DULoxetine (CYMBALTA) 30 MG capsule Take 1 capsule (30 mg total) by mouth daily.   DULoxetine (CYMBALTA) 60 MG capsule Take 1 capsule (60 mg total) by mouth daily.   ezetimibe (ZETIA) 10 MG tablet Take 10 mg by mouth daily.   FARXIGA 10 MG TABS tablet Take 10 mg by mouth daily.    FIRDAPSE 10 MG TABS Take 80 mg by mouth daily.   HYDROcodone-acetaminophen (NORCO/VICODIN) 5-325 MG tablet TAKE (1) TABLET EVERY SIX HOURS AS NEEDED. MUST LAST 28 DAYS   LEVEMIR FLEXTOUCH 100 UNIT/ML FlexPen 10 Units daily.   levothyroxine (SYNTHROID) 137 MCG tablet Take 137 mcg by mouth daily.   lisinopril (PRINIVIL,ZESTRIL) 5 MG tablet TAKE 1 TABLET (5 MG TOTAL) BY MOUTH DAILY.   Multiple Vitamin (MULTIVITAMIN WITH MINERALS) TABS tablet Take 1 tablet by mouth daily. One-A-Day   Omega-3 Fatty Acids (FISH OIL) 1200 MG CAPS Take 1,200 mg by mouth 3 (three) times daily.   predniSONE (DELTASONE) 20 MG tablet Take 40 mg by mouth daily with breakfast. (Patient not taking: Reported on 07/03/2021)   pyridostigmine (MESTINON) 60 MG tablet Take 1 tablet (60 mg total) by mouth 3 (three) times daily.   rosuvastatin (CRESTOR) 5 MG tablet SMARTSIG:0.5 Tablet(s) By Mouth 3 Times a Week   silver sulfADIAZINE (SILVADENE) 1 % cream Apply 1 application topically daily.   valACYclovir (VALTREX) 1000 MG tablet Take 1 tablet (1,000 mg total) by mouth 2 (two) times daily.   zolpidem (AMBIEN) 10 MG tablet TAKE 1 TABLET AT BEDTIME AS NEEDED FOR SLEEP  No current facility-administered medications for this visit. (Other)      REVIEW OF SYSTEMS: ROS   Negative for: Constitutional, Gastrointestinal, Neurological, Skin, Genitourinary, Musculoskeletal, HENT, Endocrine, Cardiovascular, Eyes, Respiratory, Psychiatric, Allergic/Imm, Heme/Lymph Last edited by Silvestre Moment on 09/17/2021  8:19 AM.       ALLERGIES Allergies  Allergen Reactions   Statins Other (See Comments)    MYALGIAS   Aminoglycosides    Botulinum Toxins    Calcium Channel Blockers    Macrolides And Ketolides    Quinine Derivatives    Vioxx [Rofecoxib]     UNSPECIFIED REACTION    Ofloxacin Itching    Topical ofloxacin eyedrops to the left eye with periocular itching.   Toradol [Ketorolac Tromethamine] Rash    UNSPECIFIED REACTION     PAST  MEDICAL HISTORY Past Medical History:  Diagnosis Date   Anomalous coronary artery origin    The patient underwent cardiac catheterization in 2004.  Coronaries revealed no stenosis.  However he had aberrant takeoff of both the right and left coronaries from the anterior aortic cusp.  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 and Dr. Floyde Parkins   [name] [facility])    Murmur    Systolic murmur, December, 2012,No valvular abnormalities, echo, December, 2012   Obesity    Ocular migraine 11/30/2019   Pre-syncope    February 23, 2011   QT interval    Question prolongation QT interval, October, 2012, review of EKG dated February 04, 2011, QT interval was not prolonged   Past Surgical History:  Procedure Laterality Date   KNEE SURGERY Right    PORT-A-CATH REMOVAL N/A 11/12/2017   Procedure: REMOVAL PORT-A-CATH;  Surgeon: Clovis Riley, MD;  Location: [facility];  Service: General;  Laterality: N/A;   PORTACATH PLACEMENT     TONSILLECTOMY      FAMILY HISTORY Family History  Problem Relation Age of Onset   Hypertension Mother    Fibromyalgia Mother    Cirrhosis Father    Autoimmune disease Sister    Hypertension Brother    Cirrhosis Maternal Grandmother        non alcoholic   Heart disease Other    Stroke Other    Cancer Other    Healthy Son    Healthy Son     SOCIAL HISTORY Social History   Tobacco Use   Smoking status: Never   Smokeless tobacco: Never  Vaping Use   Vaping Use: Never used  Substance Use Topics   Alcohol use: Not Currently    Alcohol/week: 0.0  standard drinks   Drug use: No         OPHTHALMIC EXAM:  Base Eye Exam     Visual Acuity (ETDRS)       Right Left   Dist cc 20/50 -1 20/150 -1   Dist ph cc 20/25 -1 20/40 +2    Correction: Glasses         Tonometry (Tonopen, 8:26 AM)  Right Left   Pressure 14 8         Pupils       Pupils APD   Right PERRL None   Left PERRL None         Visual Fields       Left Right    Full Full         Extraocular Movement       Right Left    Full Full         Neuro/Psych     Oriented x3: Yes   Mood/Affect: Normal         Dilation     Left eye: 2.5% Phenylephrine, 1.0% Mydriacyl @ 8:26 AM           Slit Lamp and Fundus Exam     External Exam       Right Left   External Normal Normal         Slit Lamp Exam       Right Left   Lids/Lashes Normal Normal   Conjunctiva/Sclera White and quiet White and quiet   Cornea Clear Clear   Anterior Chamber Deep and quiet Deep and quiet   Iris Round and reactive Round and reactive   Lens 2+ Nuclear sclerosis 2+ Nuclear sclerosis   Anterior Vitreous Normal Normal         Fundus Exam       Right Left   Posterior Vitreous  Clear, oil fill   Disc  Normal   C/D Ratio  0.25   Macula  Normal   Vessels  Normal   Periphery  Good buckling effect, large tire inferior, encircled, retina flat inferiorly.            IMAGING AND PROCEDURES  Imaging and Procedures for 09/17/21  Color Fundus Photography Optos - OU - Both Eyes       Right Eye Progression has no prior data. Disc findings include normal observations. Macula : normal observations. Vessels : normal observations. Periphery : normal observations.   Left Eye Progression has no prior data. Disc findings include normal observations. Macula : normal observations. Vessels : normal observations.   Notes OS  clear silicone oil.  Excellent scleral buckle indentation inferior 180.  Good laser retinopexy around breaks at 3:00 as around  as well as around original break at 830 meridian, excellent mirror reflex off macula  OD, area of lattice degeneration inferiorly, not visualized in the lashes             ASSESSMENT/PLAN:  New partial retinal detachment with multiple defects, left Inferior retinal detachment OS doing well status post repair via vitrectomy scleral buckle silicone oil injection.  May 2023.  At 1 month looks great with excellent potential visual acuity.,  Via pinhole of 20/40      ICD-10-CM   1. Vitreous hemorrhage of left eye ([place])  H43.12 Color Fundus Photography Optos - OU - Both Eyes    2. New partial retinal detachment with multiple defects, left  H33.022       1.  OS looks great, will continue to monitor and observe.  Patient understands limitations of not sleeping on his back.  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  2.  Keep patient out of work for the next 3 months.  3.  Next visit dilate left eye, anticipate preoperative evaluation for vitrectomy oil removal early August 2023  Ophthalmic Meds Ordered  this visit:  No orders of the defined types were placed in this encounter.      Return in about 7 weeks (around 11/05/2021) for dilate, OS, OCT, COLOR FP,, and likely preop for vitrectomy oil removal OS.  Patient Instructions  Patient may use artificial tears as needed  Patient to continue on vitamin B complex, once daily   Explained the diagnoses, plan, and follow up with the patient and they expressed understanding.  Patient expressed understanding of the importance of proper follow up care.   Clent Demark Daysen Gundrum M.D. Diseases & Surgery of the Retina and Vitreous [facility] [place] 09/17/21     Abbreviations: M myopia (nearsighted); A astigmatism; H hyperopia (farsighted); P presbyopia; Mrx spectacle prescription;  CTL contact lenses; OD right eye; OS left eye; OU both eyes  XT exotropia; ET esotropia; PEK  punctate epithelial keratitis; PEE punctate epithelial erosions; DES dry eye syndrome; MGD meibomian gland dysfunction; ATs artificial tears; PFAT's preservative free artificial tears; [place] nuclear sclerotic cataract; PSC posterior subcapsular cataract; ERM epi-retinal membrane; PVD posterior vitreous detachment; RD retinal detachment; DM diabetes mellitus; DR diabetic retinopathy; NPDR non-proliferative diabetic retinopathy; PDR proliferative diabetic retinopathy; CSME clinically significant macular edema; DME diabetic macular edema; dbh dot blot hemorrhages; CWS cotton wool spot; POAG primary open angle glaucoma; C/D cup-to-disc ratio; HVF humphrey visual field; GVF goldmann visual field; OCT optical coherence tomography; IOP intraocular pressure; BRVO Branch retinal vein occlusion; CRVO central retinal vein occlusion; CRAO central retinal artery occlusion; BRAO branch retinal artery occlusion; RT retinal tear; SB scleral buckle; PPV pars plana vitrectomy; VH Vitreous hemorrhage; PRP panretinal laser photocoagulation; IVK intravitreal kenalog; VMT vitreomacular traction; MH Macular hole;  NVD neovascularization of the disc; NVE neovascularization elsewhere; AREDS age related eye disease study; ARMD age related macular degeneration; POAG primary open angle glaucoma; EBMD epithelial/anterior basement membrane dystrophy; ACIOL anterior chamber intraocular lens; IOL intraocular lens; PCIOL posterior chamber intraocular lens; Phaco/IOL phacoemulsification with intraocular lens placement; [place] photorefractive keratectomy; LASIK laser assisted in situ keratomileusis; HTN hypertension; DM diabetes mellitus; COPD chronic obstructive pulmonary disease

## 2021-09-17 NOTE — Assessment & Plan Note (Signed)
Inferior retinal detachment OS doing well status post repair via vitrectomy scleral buckle silicone oil injection.  May 2023.  At 1 month looks great with excellent potential visual acuity.,  Via pinhole of 20/40

## 2021-09-17 NOTE — Patient Instructions (Signed)
Patient may use artificial tears as needed  Patient to continue on vitamin B complex, once daily

## 2021-09-23 DIAGNOSIS — G64 Other disorders of peripheral nervous system: Secondary | ICD-10-CM | POA: Diagnosis not present

## 2021-09-23 DIAGNOSIS — G7080 Lambert-Eaton syndrome, unspecified: Secondary | ICD-10-CM | POA: Diagnosis not present

## 2021-09-24 ENCOUNTER — Encounter (INDEPENDENT_AMBULATORY_CARE_PROVIDER_SITE_OTHER): Payer: Self-pay | Admitting: Ophthalmology

## 2021-10-06 DIAGNOSIS — G64 Other disorders of peripheral nervous system: Secondary | ICD-10-CM | POA: Insufficient documentation

## 2021-10-07 DIAGNOSIS — G7080 Lambert-Eaton syndrome, unspecified: Secondary | ICD-10-CM | POA: Diagnosis not present

## 2021-10-07 DIAGNOSIS — G64 Other disorders of peripheral nervous system: Secondary | ICD-10-CM | POA: Diagnosis not present

## 2021-11-05 ENCOUNTER — Ambulatory Visit (INDEPENDENT_AMBULATORY_CARE_PROVIDER_SITE_OTHER): Payer: BC Managed Care – PPO | Admitting: Ophthalmology

## 2021-11-05 ENCOUNTER — Encounter (INDEPENDENT_AMBULATORY_CARE_PROVIDER_SITE_OTHER): Payer: Self-pay | Admitting: Ophthalmology

## 2021-11-05 DIAGNOSIS — H33022 Retinal detachment with multiple breaks, left eye: Secondary | ICD-10-CM

## 2021-11-05 DIAGNOSIS — H35352 Cystoid macular degeneration, left eye: Secondary | ICD-10-CM | POA: Insufficient documentation

## 2021-11-05 DIAGNOSIS — H4312 Vitreous hemorrhage, left eye: Secondary | ICD-10-CM

## 2021-11-05 NOTE — Assessment & Plan Note (Signed)
Nearly 3 months postrepair inferior retinal detachment, macula off via vitrectomy, internal drainage subretinal fluid, scleral buckle, cryopexy and instillation silicone oil.  Now time to schedule silicone oil removal via vitrectomy.  I explained to the patient that the retained oral in the setting of natural lens being in place means that even after surgical removal there will be floaters remaining in the eye even though numerous attempts to minimize that his floaters will be undertaken.  I explained the patient that surgical intervention is intended to remove the silicone oil but that there is always a 6 to 8% chance that a redetachment can occur simply due to the nature retinal detachments and their repair and the healing process.  At the time of surgery additional laser treatment be applied to suspicious areas that could trigger such an event.  Patient also understands that cataract progression will happen in the left eye after or over is removed and there was in the subsequent months to years

## 2021-11-05 NOTE — Assessment & Plan Note (Signed)
At time of oil removal placed small amount of diluted steroid to calm the CME

## 2021-11-05 NOTE — Progress Notes (Signed)
11/05/2021     CHIEF COMPLAINT Patient presents for  Chief Complaint  Patient presents with   Retina Follow Up      HISTORY OF PRESENT ILLNESS: [name] is a 61 y.o. male who presents to the clinic today for:   HPI     Retina Follow Up           Diagnosis: Other   Laterality: left eye   Severity: moderate   Course: stable         Comments   7 weeks for DILATE, OS, OCT, COLOR FP,, and likely preop for vit. Oil removal OS. Pt stated vision has gotten better since last visit. However pt reports, "its still like looking through a glass of oil. I can see my hand through my peripheral but not my blind spot with my left eye. Pt has allergy to ofloxacin        Last edited by Silvestre Moment on 11/05/2021 11:10 AM.      Referring physician: Adaline Sill, NP [id number] [place],  [place] [address]  HISTORICAL INFORMATION:   Selected notes from the MEDICAL RECORD NUMBER    Lab Results  Component Value Date   HGBA1C 6.6 (H) 07/03/2021     CURRENT MEDICATIONS: No current outpatient medications on file. (Ophthalmic Drugs)   No current facility-administered medications for this visit. (Ophthalmic Drugs)   Current Outpatient Medications (Other)  Medication Sig   ALPRAZolam (XANAX) 0.5 MG tablet TAKE (1) TABLET THREE TIMES DAILY AS NEEDED.   celecoxib (CELEBREX) 100 MG capsule Take 1 capsule (100 mg total) by mouth 2 (two) times daily as needed.   Coenzyme Q10 (CO Q-10) 200 MG CAPS Take 200 mg by mouth daily.   DULoxetine (CYMBALTA) 30 MG capsule Take 1 capsule (30 mg total) by mouth daily.   DULoxetine (CYMBALTA) 60 MG capsule Take 1 capsule (60 mg total) by mouth daily.   ezetimibe (ZETIA) 10 MG tablet Take 10 mg by mouth daily.   FARXIGA 10 MG TABS tablet Take 10 mg by mouth daily.   FIRDAPSE 10 MG TABS Take 80 mg by mouth daily.   HYDROcodone-acetaminophen (NORCO/VICODIN) 5-325 MG tablet TAKE (1) TABLET EVERY SIX HOURS AS NEEDED. MUST LAST 28 DAYS    LEVEMIR FLEXTOUCH 100 UNIT/ML FlexPen 10 Units daily.   levothyroxine (SYNTHROID) 137 MCG tablet Take 137 mcg by mouth daily.   lisinopril (PRINIVIL,ZESTRIL) 5 MG tablet TAKE 1 TABLET (5 MG TOTAL) BY MOUTH DAILY.   Multiple Vitamin (MULTIVITAMIN WITH MINERALS) TABS tablet Take 1 tablet by mouth daily. One-A-Day   Omega-3 Fatty Acids (FISH OIL) 1200 MG CAPS Take 1,200 mg by mouth 3 (three) times daily.   predniSONE (DELTASONE) 20 MG tablet Take 40 mg by mouth daily with breakfast. (Patient not taking: Reported on 07/03/2021)   pyridostigmine (MESTINON) 60 MG tablet Take 1 tablet (60 mg total) by mouth 3 (three) times daily.   rosuvastatin (CRESTOR) 5 MG tablet SMARTSIG:0.5 Tablet(s) By Mouth 3 Times a Week   silver sulfADIAZINE (SILVADENE) 1 % cream Apply 1 application topically daily.   valACYclovir (VALTREX) 1000 MG tablet Take 1 tablet (1,000 mg total) by mouth 2 (two) times daily.   zolpidem (AMBIEN) 10 MG tablet TAKE 1 TABLET AT BEDTIME AS NEEDED FOR SLEEP   No current facility-administered medications for this visit. (Other)      REVIEW OF SYSTEMS: ROS   Negative for: Constitutional, Gastrointestinal, Neurological, Skin, Genitourinary, Musculoskeletal, HENT, Endocrine,  Cardiovascular, Eyes, Respiratory, Psychiatric, Allergic/Imm, Heme/Lymph Last edited by Silvestre Moment on 11/05/2021 11:10 AM.       ALLERGIES Allergies  Allergen Reactions   Statins Other (See Comments)    MYALGIAS   Aminoglycosides    Botulinum Toxins    Calcium Channel Blockers    Macrolides And Ketolides    Quinine Derivatives    Vioxx [Rofecoxib]     UNSPECIFIED REACTION    Ofloxacin Itching    Topical ofloxacin eyedrops to the left eye with periocular itching.   Toradol [Ketorolac Tromethamine] Rash    UNSPECIFIED REACTION     PAST MEDICAL HISTORY Past Medical History:  Diagnosis Date   Anomalous coronary artery origin    The patient underwent cardiac catheterization in 2004.  Coronaries revealed no  stenosis.  However he had aberrant takeoff of both the right and left coronaries from the anterior aortic cusp.  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 and Dr. Floyde Parkins   [name] [facility])    Murmur    Systolic murmur, December, 2012,No valvular abnormalities, echo, December, 2012   Obesity    Ocular migraine 11/30/2019   Pre-syncope    February 23, 2011   QT interval    Question prolongation QT interval, October, 2012, review of EKG dated February 04, 2011, QT interval was not prolonged   Past Surgical History:  Procedure Laterality Date   KNEE SURGERY Right    PORT-A-CATH REMOVAL N/A 11/12/2017   Procedure: REMOVAL PORT-A-CATH;  Surgeon: Clovis Riley, MD;  Location: [facility] OR;  Service: General;  Laterality: N/A;   PORTACATH PLACEMENT     TONSILLECTOMY      FAMILY HISTORY Family History  Problem Relation Age of Onset   Hypertension Mother    Fibromyalgia Mother    Cirrhosis Father    Autoimmune disease Sister    Hypertension Brother    Cirrhosis Maternal Grandmother        non alcoholic   Heart disease Other    Stroke Other    Cancer Other    Healthy Son    Healthy Son     SOCIAL HISTORY Social History   Tobacco Use   Smoking status: Never   Smokeless tobacco: Never  Vaping Use   Vaping Use: Never used  Substance Use Topics   Alcohol use: Not Currently    Alcohol/week: 0.0 standard drinks of alcohol   Drug use: No         OPHTHALMIC EXAM:  Base Eye Exam     Visual Acuity (ETDRS)       Right Left   Dist cc 20/25 20/200 -1   Dist ph cc   20/80 -1    Correction: Glasses         Tonometry (Tonopen, 10:57 AM)       Right Left   Pressure 14 10         Pupils       Pupils APD  Right PERRL None   Left PERRL None         Visual Fields       Left Right    Full Full         Extraocular Movement       Right Left    Full Full         Neuro/Psych     Oriented x3: Yes   Mood/Affect: Normal         Dilation     Left eye: 2.5% Phenylephrine, 1.0% Mydriacyl @ 10:57 AM           Slit Lamp and Fundus Exam     External Exam       Right Left   External Normal Normal         Slit Lamp Exam       Right Left   Lids/Lashes Normal Normal   Conjunctiva/Sclera White and quiet White and quiet   Cornea Clear Clear   Anterior Chamber Deep and quiet Deep and quiet   Iris Round and reactive Round and reactive   Lens 2+ Nuclear sclerosis 2+ Nuclear sclerosis   Anterior Vitreous Normal Normal         Fundus Exam       Right Left   Posterior Vitreous  Clear, oil fill   Disc  Normal   C/D Ratio  0.25   Macula  Cystoid macular edema   Vessels  Normal   Periphery  Good buckling effect, large tire inferior, encircled, retina flat inferiorly.            IMAGING AND PROCEDURES  Imaging and Procedures for 11/05/21  OCT, Retina - OU - Both Eyes       Right Eye Quality was good. Scan locations included subfoveal. Central Foveal Thickness: 278. Progression has been stable. Findings include normal foveal contour.   Left Eye Quality was good. Scan locations included subfoveal. Central Foveal Thickness: 400. Progression has worsened. Findings include cystoid macular edema.   Notes OS, macula attached.     Color Fundus Photography Optos - OU - Both Eyes       Right Eye Progression has no prior data. Disc findings include normal observations. Macula : normal observations. Vessels : normal observations. Periphery : normal observations.   Left Eye Progression has no prior data.  Disc findings include normal observations. Macula : normal observations. Vessels : normal observations.   Notes OS  clear silicone oil.  Excellent scleral buckle indentation inferior 180.  Good laser retinopexy around breaks at 3:00 as around as well as around original break at 830 meridian, excellent mirror reflex off macula  OD, area of lattice degeneration inferiorly, not visualized in the lashes             ASSESSMENT/PLAN:  New partial retinal detachment with multiple defects, left Nearly 3 months postrepair inferior retinal detachment, macula off via vitrectomy, internal drainage subretinal fluid, scleral buckle, cryopexy and instillation silicone oil.  Now time to schedule silicone oil removal via vitrectomy.  I explained to the patient that the retained oral in the setting of natural lens being in place means that even after surgical removal there will be floaters remaining in the eye even though numerous attempts to minimize that his floaters will be undertaken.  I explained the patient that surgical intervention is intended to remove the silicone oil but that there is always a 6 to 8% chance that a redetachment can occur simply due to the  nature retinal detachments and their repair and the healing process.  At the time of surgery additional laser treatment be applied to suspicious areas that could trigger such an event.  Patient also understands that cataract progression will happen in the left eye after or over is removed and there was in the subsequent months to years  Cystoid macular edema of left eye At time of oil removal placed small amount of diluted steroid to calm the CME     ICD-10-CM   1. Vitreous hemorrhage of left eye (HCC)  H43.12 OCT, Retina - OU - Both Eyes    Color Fundus Photography Optos - OU - Both Eyes    2. New partial retinal detachment with multiple defects, left  H33.022     3. Cystoid macular edema of left eye  H35.352       1.  OS will  need vitrectomy silicone oil removal left eye  2.  Schedule nonemergent basis  3.  Ophthalmic Meds Ordered this visit:  No orders of the defined types were placed in this encounter.      Return , [facility], for Schedule vitrectomy or laser retinopexy, silicone oil removal, OS.  There are no Patient Instructions on file for this visit.   Explained the diagnoses, plan, and follow up with the patient and they expressed understanding.  Patient expressed understanding of the importance of proper follow up care.   Clent Demark Arshan Jabs M.D. Diseases & Surgery of the Retina and Vitreous [facility] [place] 11/05/21     Abbreviations: M myopia (nearsighted); A astigmatism; H hyperopia (farsighted); P presbyopia; Mrx spectacle prescription;  CTL contact lenses; OD right eye; OS left eye; OU both eyes  XT exotropia; ET esotropia; PEK punctate epithelial keratitis; PEE punctate epithelial erosions; DES dry eye syndrome; MGD meibomian gland dysfunction; ATs artificial tears; PFAT's preservative free artificial tears; [place] nuclear sclerotic cataract; PSC posterior subcapsular cataract; ERM epi-retinal membrane; PVD posterior vitreous detachment; RD retinal detachment; DM diabetes mellitus; DR diabetic retinopathy; NPDR non-proliferative diabetic retinopathy; PDR proliferative diabetic retinopathy; CSME clinically significant macular edema; DME diabetic macular edema; dbh dot blot hemorrhages; CWS cotton wool spot; POAG primary open angle glaucoma; C/D cup-to-disc ratio; HVF humphrey visual field; GVF goldmann visual field; OCT optical coherence tomography; IOP intraocular pressure; BRVO Branch retinal vein occlusion; CRVO central retinal vein occlusion; CRAO central retinal artery occlusion; BRAO branch retinal artery occlusion; RT retinal tear; SB scleral buckle; PPV pars plana vitrectomy; VH Vitreous hemorrhage; PRP panretinal laser photocoagulation; IVK intravitreal kenalog; VMT  vitreomacular traction; MH Macular hole;  NVD neovascularization of the disc; NVE neovascularization elsewhere; AREDS age related eye disease study; ARMD age related macular degeneration; POAG primary open angle glaucoma; EBMD epithelial/anterior basement membrane dystrophy; ACIOL anterior chamber intraocular lens; IOL intraocular lens; PCIOL posterior chamber intraocular lens; Phaco/IOL phacoemulsification with intraocular lens placement; [place] photorefractive keratectomy; LASIK laser assisted in situ keratomileusis; HTN hypertension; DM diabetes mellitus; COPD chronic obstructive pulmonary disease

## 2021-11-12 ENCOUNTER — Ambulatory Visit (INDEPENDENT_AMBULATORY_CARE_PROVIDER_SITE_OTHER): Payer: BC Managed Care – PPO | Admitting: Ophthalmology

## 2021-11-12 DIAGNOSIS — H33022 Retinal detachment with multiple breaks, left eye: Secondary | ICD-10-CM

## 2021-11-12 MED ORDER — TOBRAMYCIN 0.3 % OP SOLN: 1.0000 [drp] | Freq: Four times a day (QID) | OPHTHALMIC | 0 refills | Status: AC

## 2021-11-12 MED ORDER — PREDNISOLONE ACETATE 1 % OP SUSP: 1.0000 [drp] | Freq: Four times a day (QID) | OPHTHALMIC | 0 refills | Status: AC

## 2021-11-12 MED ORDER — TOBRAMYCIN 0.3 % OP SOLN: 1.0000 [drp] | OPHTHALMIC | 0 refills | Status: AC

## 2021-11-19 ENCOUNTER — Encounter (AMBULATORY_SURGERY_CENTER): Payer: Medicare Other | Admitting: Ophthalmology

## 2021-11-19 DIAGNOSIS — Z8669 Personal history of other diseases of the nervous system and sense organs: Secondary | ICD-10-CM | POA: Diagnosis not present

## 2021-11-19 DIAGNOSIS — Z48810 Encounter for surgical aftercare following surgery on the sense organs: Secondary | ICD-10-CM | POA: Diagnosis not present

## 2021-11-19 DIAGNOSIS — H33022 Retinal detachment with multiple breaks, left eye: Secondary | ICD-10-CM

## 2021-11-20 ENCOUNTER — Encounter (INDEPENDENT_AMBULATORY_CARE_PROVIDER_SITE_OTHER): Payer: Self-pay | Admitting: Ophthalmology

## 2021-11-20 ENCOUNTER — Ambulatory Visit (INDEPENDENT_AMBULATORY_CARE_PROVIDER_SITE_OTHER): Payer: BC Managed Care – PPO | Admitting: Ophthalmology

## 2021-11-20 DIAGNOSIS — H4312 Vitreous hemorrhage, left eye: Secondary | ICD-10-CM

## 2021-11-20 DIAGNOSIS — H33022 Retinal detachment with multiple breaks, left eye: Secondary | ICD-10-CM

## 2021-11-20 DIAGNOSIS — H2512 Age-related nuclear cataract, left eye: Secondary | ICD-10-CM

## 2021-11-20 NOTE — Assessment & Plan Note (Signed)
Postop day #1, removal silicone oil and additional laser retinopexy applied via vitrectomy

## 2021-11-20 NOTE — Assessment & Plan Note (Signed)
Progression of central nuclear sclerosis into a "oil droplet" type of cataract typically seen after vitrectomy, no progression overnight yet still evident

## 2021-11-20 NOTE — Assessment & Plan Note (Signed)
Postop day #1, vitrectomy silicone oil removal with focal laser into photocoagulation, mild post vitreous hemorrhage, hampers acuity, peripherally remains attached nicely.

## 2021-11-20 NOTE — Patient Instructions (Signed)
Tobrex 4 times daily to the operative eye  Prednisolone acetate 1 drop to the operative eye 4 times daily  Patient instructed not to refill the medications and use them for maximum of 3 weeks.  Patient instructed do not rub the eye.  Patient has the option to use the patch at night.   No lifting and bending for 1 week. No water IN the eye for 10 days. Do not rub the eye. Wear shield at night for 1-3 days.  Continue your topical medications for a total of 3 weeks.  Do not refill your postoperative medications unless instructed.  Refrain from exercise or intentional activity which increases our heart rate above resting levels.  Normal walking to complete normal activities of your day are appropriate.  Driving:  Legally, you only need one good eye, of 20/40 or better to drive.  However, the practice does not recommend driving during first weeks after surgery, IF you are uncomfortable with your visual functioning or capabilities.   If you have known sleep apnea, wear your CPAP as you normally should.

## 2021-11-20 NOTE — Progress Notes (Signed)
11/20/2021     CHIEF COMPLAINT Patient presents for  Chief Complaint  Patient presents with   Post-op Follow-up      HISTORY OF PRESENT ILLNESS: [name] is a 61 y.o. male who presents to the clinic today for:   HPI     Post-op Follow-up           Laterality: left eye   Vision: is blurred at distance and is blurred at near         Comments   1 day post op os sx 11/19/21 Pt states he cannot see anything at the moment Pt states he can see a little light and shadows Pt states he has like a black curtain at the top of his eye and he is seeing lines moving around in his eye       Last edited by Morene Rankins, CMA on 11/20/2021  8:50 AM.      Referring physician: Adaline Sill, NP [address] [place],  [place] [address]  HISTORICAL INFORMATION:   Selected notes from the MEDICAL RECORD NUMBER    Lab Results  Component Value Date   HGBA1C 6.6 (H) 07/03/2021     CURRENT MEDICATIONS: Current Outpatient Medications (Ophthalmic Drugs)  Medication Sig   prednisoLONE acetate (PRED FORTE) 1 % ophthalmic suspension Place 1 drop into the left eye 4 (four) times daily for 21 days.   tobramycin (TOBREX) 0.3 % ophthalmic solution Place 1 drop into the left eye every 4 (four) hours for 21 days.   tobramycin (TOBREX) 0.3 % ophthalmic solution Place 1 drop into the left eye in the morning, at noon, in the evening, and at bedtime for 21 days.   No current facility-administered medications for this visit. (Ophthalmic Drugs)   Current Outpatient Medications (Other)  Medication Sig   ALPRAZolam (XANAX) 0.5 MG tablet TAKE (1) TABLET THREE TIMES DAILY AS NEEDED.   celecoxib (CELEBREX) 100 MG capsule Take 1 capsule (100 mg total) by mouth 2 (two) times daily as needed.   Coenzyme Q10 (CO Q-10) 200 MG CAPS Take 200 mg by mouth daily.   DULoxetine (CYMBALTA) 30 MG capsule Take 1 capsule (30 mg total) by mouth daily.   DULoxetine (CYMBALTA) 60 MG capsule Take 1 capsule  (60 mg total) by mouth daily.   ezetimibe (ZETIA) 10 MG tablet Take 10 mg by mouth daily.   FARXIGA 10 MG TABS tablet Take 10 mg by mouth daily.   FIRDAPSE 10 MG TABS Take 80 mg by mouth daily.   HYDROcodone-acetaminophen (NORCO/VICODIN) 5-325 MG tablet TAKE (1) TABLET EVERY SIX HOURS AS NEEDED. MUST LAST 28 DAYS   LEVEMIR FLEXTOUCH 100 UNIT/ML FlexPen 10 Units daily.   levothyroxine (SYNTHROID) 137 MCG tablet Take 137 mcg by mouth daily.   lisinopril (PRINIVIL,ZESTRIL) 5 MG tablet TAKE 1 TABLET (5 MG TOTAL) BY MOUTH DAILY.   Multiple Vitamin (MULTIVITAMIN WITH MINERALS) TABS tablet Take 1 tablet by mouth daily. One-A-Day   Omega-3 Fatty Acids (FISH OIL) 1200 MG CAPS Take 1,200 mg by mouth 3 (three) times daily.   predniSONE (DELTASONE) 20 MG tablet Take 40 mg by mouth daily with breakfast. (Patient not taking: Reported on 07/03/2021)   pyridostigmine (MESTINON) 60 MG tablet Take 1 tablet (60 mg total) by mouth 3 (three) times daily.   rosuvastatin (CRESTOR) 5 MG tablet SMARTSIG:0.5 Tablet(s) By Mouth 3 Times a Week   silver sulfADIAZINE (SILVADENE) 1 % cream Apply 1 application topically daily.  valACYclovir (VALTREX) 1000 MG tablet Take 1 tablet (1,000 mg total) by mouth 2 (two) times daily.   zolpidem (AMBIEN) 10 MG tablet TAKE 1 TABLET AT BEDTIME AS NEEDED FOR SLEEP   No current facility-administered medications for this visit. (Other)      REVIEW OF SYSTEMS: ROS   Negative for: Constitutional, Gastrointestinal, Neurological, Skin, Genitourinary, Musculoskeletal, HENT, Endocrine, Cardiovascular, Eyes, Respiratory, Psychiatric, Allergic/Imm, Heme/Lymph Last edited by Morene Rankins, CMA on 11/20/2021  8:50 AM.       ALLERGIES Allergies  Allergen Reactions   Statins Other (See Comments)    MYALGIAS   Aminoglycosides    Botulinum Toxins    Calcium Channel Blockers    Macrolides And Ketolides    Quinine Derivatives    Vioxx [Rofecoxib]     UNSPECIFIED REACTION    Ofloxacin  Itching    Topical ofloxacin eyedrops to the left eye with periocular itching.   Toradol [Ketorolac Tromethamine] Rash    UNSPECIFIED REACTION     PAST MEDICAL HISTORY Past Medical History:  Diagnosis Date   Anomalous coronary artery origin    The patient underwent cardiac catheterization in 2004.  Coronaries revealed no stenosis.  However he had aberrant takeoff of both the right and left coronaries from the anterior aortic cusp.  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 and Dr. Floyde Parkins   [name] [facility])    Murmur    Systolic murmur, December, 2012,No valvular abnormalities, echo, December, 2012   Obesity    Ocular migraine 11/30/2019   Pre-syncope    February 23, 2011   QT interval    Question prolongation QT interval, October, 2012, review of EKG dated February 04, 2011, QT interval was not prolonged   Past Surgical History:  Procedure Laterality Date   KNEE SURGERY Right    PORT-A-CATH REMOVAL N/A 11/12/2017   Procedure: REMOVAL PORT-A-CATH;  Surgeon: Clovis Riley, MD;  Location: [place];  Service: General;  Laterality: N/A;   PORTACATH PLACEMENT     TONSILLECTOMY      FAMILY HISTORY Family History  Problem Relation Age of Onset   Hypertension Mother    Fibromyalgia Mother    Cirrhosis Father    Autoimmune disease Sister    Hypertension Brother    Cirrhosis Maternal Grandmother        non alcoholic   Heart disease Other    Stroke Other    Cancer Other    Healthy Son    Healthy Son     SOCIAL HISTORY Social History   Tobacco Use   Smoking  status: Never   Smokeless tobacco: Never  Vaping Use   Vaping Use: Never used  Substance Use Topics   Alcohol use: Not Currently    Alcohol/week: 0.0 standard drinks of alcohol   Drug use: No         OPHTHALMIC EXAM:  Base Eye Exam     Visual Acuity (ETDRS)  Right Left   Dist [place] 20/25 +3 HM         Tonometry (Tonopen, 8:55 AM)       Right Left   Pressure 16 9         Pupils       APD   Right None   Left None         Visual Fields       Left Right   Restrictions Total superior temporal, inferior temporal, superior nasal, inferior nasal deficiencies          Extraocular Movement       Right Left    Full, Ortho Full, Ortho         Neuro/Psych     Oriented x3: Yes   Mood/Affect: Normal         Dilation     Left eye: 2.5% Phenylephrine, 1.0% Mydriacyl @ 8:51 AM           Slit Lamp and Fundus Exam     External Exam       Right Left   External Normal Normal         Slit Lamp Exam       Right Left   Lids/Lashes Normal Normal   Conjunctiva/Sclera White and quiet White and quiet   Cornea Clear Clear   Anterior Chamber Deep and quiet Deep and quiet   Iris Round and reactive Round and reactive   Lens 2+ Nuclear sclerosis 2+ Nuclear sclerosis   Anterior Vitreous Normal Normal         Fundus Exam       Right Left   Posterior Vitreous  Moderate dense vitreous hemorrhage, scleral buckle and chorioretinal scarring seen peripherally 360.  Impact also from [facility] changes            IMAGING AND PROCEDURES  Imaging and Procedures for 11/20/21           ASSESSMENT/PLAN:  New partial retinal detachment with multiple defects, left Postop day #1, removal silicone oil and additional laser retinopexy applied via vitrectomy  Nuclear sclerotic cataract of left eye Progression of central nuclear sclerosis into a "oil droplet" type of cataract typically seen after vitrectomy, no progression overnight yet still  evident  Vitreous hemorrhage of left eye ([place]) Postop day #1, vitrectomy silicone oil removal with focal laser into photocoagulation, mild post vitreous hemorrhage, hampers acuity, peripherally remains attached nicely.     ICD-10-CM   1. New partial retinal detachment with multiple defects, left  H33.022     2. Nuclear sclerotic cataract of left eye  H25.12     3. Vitreous hemorrhage of left eye (HCC)  H43.12       1.  OS doing well retina attached yet mild dispersed vitreous hemorrhage likely from sclerotomy puncture sites reviewed with the patient.  2.  The fact that the left eye still has its natural lens in place slows the clearance of this  3.  At home postop positioning should include sleeping propped up at night.  Ophthalmic Meds Ordered this visit:  No orders of the defined types were placed in this encounter.      Return in about 5 days (around 11/25/2021) for dilate, OS, POST OP, COLOR FP, OCT,, possible B-scan OS.  Patient Instructions  Tobrex 4 times daily to the operative eye  Prednisolone acetate 1 drop to the operative eye 4 times daily  Patient instructed not to refill the medications and use them for maximum  of 3 weeks.  Patient instructed do not rub the eye.  Patient has the option to use the patch at night.   No lifting and bending for 1 week. No water IN the eye for 10 days. Do not rub the eye. Wear shield at night for 1-3 days.  Continue your topical medications for a total of 3 weeks.  Do not refill your postoperative medications unless instructed.  Refrain from exercise or intentional activity which increases our heart rate above resting levels.  Normal walking to complete normal activities of your day are appropriate.  Driving:  Legally, you only need one good eye, of 20/40 or better to drive.  However, the practice does not recommend driving during first weeks after surgery, IF you are uncomfortable with your visual functioning or capabilities.   If  you have known sleep apnea, wear your CPAP as you normally should.   Explained the diagnoses, plan, and follow up with the patient and they expressed understanding.  Patient expressed understanding of the importance of proper follow up care.   Clent Demark Keerthi Hazell M.D. Diseases & Surgery of the Retina and Vitreous Retina & Diabetic [place] 11/20/21     Abbreviations: M myopia (nearsighted); A astigmatism; H hyperopia (farsighted); P presbyopia; Mrx spectacle prescription;  CTL contact lenses; OD right eye; OS left eye; OU both eyes  XT exotropia; ET esotropia; PEK punctate epithelial keratitis; PEE punctate epithelial erosions; DES dry eye syndrome; MGD meibomian gland dysfunction; ATs artificial tears; PFAT's preservative free artificial tears; [place] nuclear sclerotic cataract; PSC posterior subcapsular cataract; ERM epi-retinal membrane; PVD posterior vitreous detachment; RD retinal detachment; DM diabetes mellitus; DR diabetic retinopathy; NPDR non-proliferative diabetic retinopathy; PDR proliferative diabetic retinopathy; CSME clinically significant macular edema; DME diabetic macular edema; dbh dot blot hemorrhages; CWS cotton wool spot; POAG primary open angle glaucoma; C/D cup-to-disc ratio; HVF humphrey visual field; GVF goldmann visual field; OCT optical coherence tomography; IOP intraocular pressure; BRVO Branch retinal vein occlusion; CRVO central retinal vein occlusion; CRAO central retinal artery occlusion; BRAO branch retinal artery occlusion; RT retinal tear; SB scleral buckle; PPV pars plana vitrectomy; VH Vitreous hemorrhage; PRP panretinal laser photocoagulation; IVK intravitreal kenalog; VMT vitreomacular traction; MH Macular hole;  NVD neovascularization of the disc; NVE neovascularization elsewhere; AREDS age related eye disease study; ARMD age related macular degeneration; POAG primary open angle glaucoma; EBMD epithelial/anterior basement membrane dystrophy; ACIOL anterior chamber  intraocular lens; IOL intraocular lens; PCIOL posterior chamber intraocular lens; Phaco/IOL phacoemulsification with intraocular lens placement; [place] photorefractive keratectomy; LASIK laser assisted in situ keratomileusis; HTN hypertension; DM diabetes mellitus; COPD chronic obstructive pulmonary disease

## 2021-11-21 DIAGNOSIS — E785 Hyperlipidemia, unspecified: Secondary | ICD-10-CM | POA: Diagnosis not present

## 2021-11-21 DIAGNOSIS — G8929 Other chronic pain: Secondary | ICD-10-CM | POA: Diagnosis not present

## 2021-11-25 ENCOUNTER — Ambulatory Visit (INDEPENDENT_AMBULATORY_CARE_PROVIDER_SITE_OTHER): Payer: BC Managed Care – PPO | Admitting: Ophthalmology

## 2021-11-25 ENCOUNTER — Encounter (INDEPENDENT_AMBULATORY_CARE_PROVIDER_SITE_OTHER): Payer: Self-pay | Admitting: Ophthalmology

## 2021-11-25 DIAGNOSIS — H2512 Age-related nuclear cataract, left eye: Secondary | ICD-10-CM

## 2021-11-25 DIAGNOSIS — H33022 Retinal detachment with multiple breaks, left eye: Secondary | ICD-10-CM

## 2021-11-25 DIAGNOSIS — H4312 Vitreous hemorrhage, left eye: Secondary | ICD-10-CM

## 2021-11-25 NOTE — Assessment & Plan Note (Signed)
B-scan ultrasonography left eye today confirms retina is attached

## 2021-11-25 NOTE — Progress Notes (Signed)
11/25/2021     CHIEF COMPLAINT Patient presents for  Chief Complaint  Patient presents with   Eye Pain      HISTORY OF PRESENT ILLNESS: [name] is a 61 y.o. male who presents to the clinic today for:   HPI     Eye Pain           Laterality: In left eye   Pain scale: 9/10   Frequency: intermittently         Comments   1 week post op os color fp oct possible b scan os 11/19/21 Pt states he still cannot see anything but light  Pt denies any new floaters or FOL  Pt states his eye is giving him a lot of pain and his eye is really tender  Pt states he is starting to get car sick and he cannot keep his eyes open in the car Pt states when the light is on he can see hand motion       Last edited by Morene Rankins, CMA on 11/25/2021  8:33 AM.      Referring physician: Adaline Sill, NP [address] [place],  [place] [address]  HISTORICAL INFORMATION:   Selected notes from the MEDICAL RECORD NUMBER    Lab Results  Component Value Date   HGBA1C 6.6 (H) 07/03/2021     CURRENT MEDICATIONS: Current Outpatient Medications (Ophthalmic Drugs)  Medication Sig   prednisoLONE acetate (PRED FORTE) 1 % ophthalmic suspension Place 1 drop into the left eye 4 (four) times daily for 21 days.   tobramycin (TOBREX) 0.3 % ophthalmic solution Place 1 drop into the left eye every 4 (four) hours for 21 days.   tobramycin (TOBREX) 0.3 % ophthalmic solution Place 1 drop into the left eye in the morning, at noon, in the evening, and at bedtime for 21 days.   No current facility-administered medications for this visit. (Ophthalmic Drugs)   Current Outpatient Medications (Other)  Medication Sig   ALPRAZolam (XANAX) 0.5 MG tablet TAKE (1) TABLET THREE TIMES DAILY AS NEEDED.   celecoxib (CELEBREX) 100 MG capsule Take 1 capsule (100 mg total) by mouth 2 (two) times daily as needed.   Coenzyme Q10 (CO Q-10) 200 MG CAPS Take 200 mg by mouth daily.   DULoxetine (CYMBALTA) 30 MG  capsule Take 1 capsule (30 mg total) by mouth daily.   DULoxetine (CYMBALTA) 60 MG capsule Take 1 capsule (60 mg total) by mouth daily.   ezetimibe (ZETIA) 10 MG tablet Take 10 mg by mouth daily.   FARXIGA 10 MG TABS tablet Take 10 mg by mouth daily.   FIRDAPSE 10 MG TABS Take 80 mg by mouth daily.   HYDROcodone-acetaminophen (NORCO/VICODIN) 5-325 MG tablet TAKE (1) TABLET EVERY SIX HOURS AS NEEDED. MUST LAST 28 DAYS   LEVEMIR FLEXTOUCH 100 UNIT/ML FlexPen 10 Units daily.   levothyroxine (SYNTHROID) 137 MCG tablet Take 137 mcg by mouth daily.   lisinopril (PRINIVIL,ZESTRIL) 5 MG tablet TAKE 1 TABLET (5 MG TOTAL) BY MOUTH DAILY.   Multiple Vitamin (MULTIVITAMIN WITH MINERALS) TABS tablet Take 1 tablet by mouth daily. One-A-Day   Omega-3 Fatty Acids (FISH OIL) 1200 MG CAPS Take 1,200 mg by mouth 3 (three) times daily.   predniSONE (DELTASONE) 20 MG tablet Take 40 mg by mouth daily with breakfast. (Patient not taking: Reported on 07/03/2021)   pyridostigmine (MESTINON) 60 MG tablet Take 1 tablet (60 mg total) by mouth 3 (three) times daily.  rosuvastatin (CRESTOR) 5 MG tablet SMARTSIG:0.5 Tablet(s) By Mouth 3 Times a Week   silver sulfADIAZINE (SILVADENE) 1 % cream Apply 1 application topically daily.   valACYclovir (VALTREX) 1000 MG tablet Take 1 tablet (1,000 mg total) by mouth 2 (two) times daily.   zolpidem (AMBIEN) 10 MG tablet TAKE 1 TABLET AT BEDTIME AS NEEDED FOR SLEEP   No current facility-administered medications for this visit. (Other)      REVIEW OF SYSTEMS: ROS   Negative for: Constitutional, Gastrointestinal, Neurological, Skin, Genitourinary, Musculoskeletal, HENT, Endocrine, Cardiovascular, Eyes, Respiratory, Psychiatric, Allergic/Imm, Heme/Lymph Last edited by Morene Rankins, CMA on 11/25/2021  8:33 AM.       ALLERGIES Allergies  Allergen Reactions   Statins Other (See Comments)    MYALGIAS   Aminoglycosides    Botulinum Toxins    Calcium Channel Blockers     Macrolides And Ketolides    Quinine Derivatives    Vioxx [Rofecoxib]     UNSPECIFIED REACTION    Ofloxacin Itching    Topical ofloxacin eyedrops to the left eye with periocular itching.   Toradol [Ketorolac Tromethamine] Rash    UNSPECIFIED REACTION     PAST MEDICAL HISTORY Past Medical History:  Diagnosis Date   Anomalous coronary artery origin    The patient underwent cardiac catheterization in 2004.  Coronaries revealed no stenosis.  However he had aberrant takeoff of both the right and left coronaries from the anterior aortic cusp.  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 and Dr. Floyde Parkins   [name] [facility])    Murmur    Systolic murmur, December, 2012,No valvular abnormalities, echo, December, 2012   Obesity    Ocular migraine 11/30/2019   Pre-syncope    February 23, 2011   QT interval    Question prolongation QT interval, October, 2012, review of EKG dated February 04, 2011, QT interval was not prolonged   Past Surgical History:  Procedure Laterality Date   KNEE SURGERY Right    PORT-A-CATH REMOVAL N/A 11/12/2017   Procedure: REMOVAL PORT-A-CATH;  Surgeon: Clovis Riley, MD;  Location: [facility];  Service: General;  Laterality: N/A;   PORTACATH PLACEMENT     TONSILLECTOMY      FAMILY HISTORY Family History  Problem Relation Age of Onset   Hypertension Mother    Fibromyalgia Mother    Cirrhosis Father    Autoimmune disease Sister    Hypertension Brother    Cirrhosis Maternal Grandmother        non alcoholic   Heart disease Other    Stroke Other     Cancer Other    Healthy Son    Healthy Son     SOCIAL HISTORY Social History   Tobacco Use   Smoking status: Never   Smokeless tobacco: Never  Vaping Use   Vaping Use: Never used  Substance Use Topics   Alcohol use: Not Currently    Alcohol/week: 0.0 standard drinks of alcohol   Drug  use: No         OPHTHALMIC EXAM:  Base Eye Exam     Visual Acuity (Snellen - Linear)       Right Left   Dist [place] 20/25 +3 LP/HM         Tonometry (Tonopen, 8:37 AM)       Right Left   Pressure 8 12         Neuro/Psych     Oriented x3: Yes   Mood/Affect: Normal         Dilation     Left eye: 2.5% Phenylephrine, 1.0% Mydriacyl @ 8:35 AM           Slit Lamp and Fundus Exam     External Exam       Right Left   External Normal Normal         Slit Lamp Exam       Right Left   Lids/Lashes Normal Normal   Conjunctiva/Sclera White and quiet White and quiet   Cornea Clear Clear   Anterior Chamber Deep and quiet Deep and quiet   Iris Round and reactive Round and reactive   Lens 2+ Nuclear sclerosis 2+ Nuclear sclerosis, early oil droplet nsc cataract   Anterior Vitreous Normal 1+ emulsified oil, 0-1+ hemorrhage         Fundus Exam       Right Left   Posterior Vitreous  Moderate dense vitreous hemorrhage, scleral buckle and chorioretinal scarring seen peripherally 360.  Impact also from [facility] changes   Disc  No view nerve   Macula  No view macula   Periphery  Attached peripherally, buckle visible 60.            IMAGING AND PROCEDURES  Imaging and Procedures for 11/25/21  B-Scan Ultrasound - OS - Left Eye       Post vitrectomy silicone oil removal, with mild to moderate postop vitreous hemorrhage and a phakic eye.  Retina attached 360 with ultrasound imaging today.  Large floaters are emulsified silicone oil otherwise dispersed with with 1+ postop vitreous hemorrhage             ASSESSMENT/PLAN:  Nuclear sclerotic cataract of left  eye [place] surgically left eye from post vitrectomized type of oil droplet cataract developing.  The cataract itself, in fact the native lens, is a barrier to further absorption of the mild to moderate vitreous hemorrhage  May need cataract surgery in the left eye in order to hasten the clearance of the vitreous hemorrhage.  Vitreous hemorrhage of left eye (HCC) Postop dispersed vitreous hemorrhage left eye, clears in the morning and slowly worsens throughout the day with dispersion and and the ocular and patient movement.  [place] to monitor and encourage patient so as to avoid another surgery.  New partial retinal detachment with multiple defects, left B-scan ultrasonography left eye today confirms retina is attached     ICD-10-CM   1. New partial retinal detachment with multiple defects, left  H33.022 B-Scan Ultrasound - OS - Left Eye    CANCELED: OCT, Retina - OU - Both Eyes    CANCELED: Color Fundus Photography Optos - OU - Both Eyes    2. Nuclear sclerotic cataract of left eye  H25.12     3. Vitreous hemorrhage of left eye (HCC)  H43.12       1.  OS, dispersed vitreous hemorrhage clears in the morning but does worsen.  Likely postop vitreous hemorrhage from sclerotomy insertion.  2.  With Eaton-Lambert syndrome worsen systemically with neurological condition with less movement.  Thus will return patient to full activity in 6 days  3.  The explanation with the patient to possible for cataract surgery left eye OS to allow for hastening of vitreous hemorrhage clearance.  No retinal detachment seen today on B-scan ultrasonography we will continue to patiently wait for clearance of hemorrhage.  Ophthalmic Meds Ordered this visit:  No orders of the defined types were placed in this encounter.      Return in about 2 weeks (around 12/09/2021) for dilate, OS, POST OP, COLOR FP,, possible B-scan.  Patient Instructions  Patient to continue on topical medications particularly  ofloxacin and prednisolone acetate left eye now only 1 drop twice daily to the left eye.   Patient allowed to resume full activity in 6 days.  No compression or pressure on the on the eye    Explained the diagnoses, plan, and follow up with the patient and they expressed understanding.  Patient expressed understanding of the importance of proper follow up care.   Clent Demark Nikolaus Pienta M.D. Diseases & Surgery of the Retina and Vitreous Retina & Diabetic [place] 11/25/21     Abbreviations: M myopia (nearsighted); A astigmatism; H hyperopia (farsighted); P presbyopia; Mrx spectacle prescription;  CTL contact lenses; OD right eye; OS left eye; OU both eyes  XT exotropia; ET esotropia; PEK punctate epithelial keratitis; PEE punctate epithelial erosions; DES dry eye syndrome; MGD meibomian gland dysfunction; ATs artificial tears; PFAT's preservative free artificial tears; [place] nuclear sclerotic cataract; PSC posterior subcapsular cataract; ERM epi-retinal membrane; PVD posterior vitreous detachment; RD retinal detachment; DM diabetes mellitus; DR diabetic retinopathy; NPDR non-proliferative diabetic retinopathy; PDR proliferative diabetic retinopathy; CSME clinically significant macular edema; DME diabetic macular edema; dbh dot blot hemorrhages; CWS cotton wool spot; POAG primary open angle glaucoma; C/D cup-to-disc ratio; HVF humphrey visual field; GVF goldmann visual field; OCT optical coherence tomography; IOP intraocular pressure; BRVO Branch retinal vein occlusion; CRVO central retinal vein occlusion; CRAO central retinal artery occlusion; BRAO branch retinal artery occlusion; RT retinal tear; SB scleral buckle; PPV pars plana vitrectomy; VH Vitreous hemorrhage; PRP panretinal laser photocoagulation; IVK intravitreal kenalog; VMT vitreomacular traction; MH Macular hole;  NVD neovascularization of the disc; NVE neovascularization elsewhere; AREDS age related eye disease study; ARMD age related macular  degeneration; POAG primary open angle glaucoma; EBMD epithelial/anterior basement membrane dystrophy; ACIOL anterior chamber intraocular lens; IOL intraocular lens; PCIOL posterior chamber intraocular lens; Phaco/IOL phacoemulsification with intraocular lens placement; [place] photorefractive keratectomy; LASIK laser assisted in situ keratomileusis; HTN hypertension; DM diabetes mellitus; COPD chronic obstructive pulmonary disease

## 2021-11-25 NOTE — Patient Instructions (Addendum)
Patient to continue on topical medications particularly ofloxacin and prednisolone acetate left eye now only 1 drop twice daily to the left eye.   Patient allowed to resume full activity in 6 days.  No compression or pressure on the on the eye

## 2021-11-25 NOTE — Assessment & Plan Note (Addendum)
Ration of [place] surgically left eye from post vitrectomized type of oil droplet cataract developing.  The cataract itself, in fact the native lens, is a barrier to further absorption of the mild to moderate vitreous hemorrhage  May need cataract surgery in the left eye in order to hasten the clearance of the vitreous hemorrhage.

## 2021-11-25 NOTE — Assessment & Plan Note (Addendum)
Postop dispersed vitreous hemorrhage left eye, clears in the morning and slowly worsens throughout the day with dispersion and and the ocular and patient movement.  Continue to monitor and encourage patient so as to avoid another surgery.

## 2021-11-27 ENCOUNTER — Encounter (INDEPENDENT_AMBULATORY_CARE_PROVIDER_SITE_OTHER): Payer: BC Managed Care – PPO | Admitting: Ophthalmology

## 2021-12-10 ENCOUNTER — Ambulatory Visit (INDEPENDENT_AMBULATORY_CARE_PROVIDER_SITE_OTHER): Payer: Medicare Other | Admitting: Ophthalmology

## 2021-12-10 ENCOUNTER — Encounter (INDEPENDENT_AMBULATORY_CARE_PROVIDER_SITE_OTHER): Payer: Self-pay | Admitting: Ophthalmology

## 2021-12-10 DIAGNOSIS — H2512 Age-related nuclear cataract, left eye: Secondary | ICD-10-CM | POA: Diagnosis not present

## 2021-12-10 DIAGNOSIS — H4312 Vitreous hemorrhage, left eye: Secondary | ICD-10-CM

## 2021-12-10 DIAGNOSIS — H33022 Retinal detachment with multiple breaks, left eye: Secondary | ICD-10-CM

## 2021-12-10 NOTE — Progress Notes (Signed)
12/10/2021     CHIEF COMPLAINT Patient presents for  Chief Complaint  Patient presents with   Post-op Follow-up      HISTORY OF PRESENT ILLNESS: [name] is a 61 y.o. male who presents to the clinic today for:   HPI   OS now some 3 weeks post vitrectomy silicone oil removal with persistence of blurred vision, upon awakening can see briefly some forms and shapes like a ceiling fan but this quickly resolves into barely hand motion vision throughout the rest of the day  2 weeks dilate os post op color fp possible b-scan Pt states his vision has not changed since the last visit it is still very dark Pt admits to floaters.  Last edited by Hurman Horn, MD on 12/10/2021  8:55 AM.      Referring physician: Debbra Riding, MD [address] [place],  [place] [address]  HISTORICAL INFORMATION:   Selected notes from the MEDICAL RECORD NUMBER    Lab Results  Component Value Date   HGBA1C 6.6 (H) 07/03/2021     CURRENT MEDICATIONS: No current outpatient medications on file. (Ophthalmic Drugs)   No current facility-administered medications for this visit. (Ophthalmic Drugs)   Current Outpatient Medications (Other)  Medication Sig   ALPRAZolam (XANAX) 0.5 MG tablet TAKE (1) TABLET THREE TIMES DAILY AS NEEDED.   celecoxib (CELEBREX) 100 MG capsule Take 1 capsule (100 mg total) by mouth 2 (two) times daily as needed.   Coenzyme Q10 (CO Q-10) 200 MG CAPS Take 200 mg by mouth daily.   DULoxetine (CYMBALTA) 30 MG capsule Take 1 capsule (30 mg total) by mouth daily.   DULoxetine (CYMBALTA) 60 MG capsule Take 1 capsule (60 mg total) by mouth daily.   ezetimibe (ZETIA) 10 MG tablet Take 10 mg by mouth daily.   FARXIGA 10 MG TABS tablet Take 10 mg by mouth daily.   FIRDAPSE 10 MG TABS Take 80 mg by mouth daily.   HYDROcodone-acetaminophen (NORCO/VICODIN) 5-325 MG tablet TAKE (1) TABLET EVERY SIX HOURS AS NEEDED. MUST LAST 28 DAYS   LEVEMIR FLEXTOUCH 100 UNIT/ML FlexPen  10 Units daily.   levothyroxine (SYNTHROID) 137 MCG tablet Take 137 mcg by mouth daily.   lisinopril (PRINIVIL,ZESTRIL) 5 MG tablet TAKE 1 TABLET (5 MG TOTAL) BY MOUTH DAILY.   Multiple Vitamin (MULTIVITAMIN WITH MINERALS) TABS tablet Take 1 tablet by mouth daily. One-A-Day   Omega-3 Fatty Acids (FISH OIL) 1200 MG CAPS Take 1,200 mg by mouth 3 (three) times daily.   predniSONE (DELTASONE) 20 MG tablet Take 40 mg by mouth daily with breakfast. (Patient not taking: Reported on 07/03/2021)   pyridostigmine (MESTINON) 60 MG tablet Take 1 tablet (60 mg total) by mouth 3 (three) times daily.   rosuvastatin (CRESTOR) 5 MG tablet SMARTSIG:0.5 Tablet(s) By Mouth 3 Times a Week   silver sulfADIAZINE (SILVADENE) 1 % cream Apply 1 application topically daily.   valACYclovir (VALTREX) 1000 MG tablet Take 1 tablet (1,000 mg total) by mouth 2 (two) times daily.   zolpidem (AMBIEN) 10 MG tablet TAKE 1 TABLET AT BEDTIME AS NEEDED FOR SLEEP   No current facility-administered medications for this visit. (Other)      REVIEW OF SYSTEMS: ROS   Negative for: Constitutional, Gastrointestinal, Neurological, Skin, Genitourinary, Musculoskeletal, HENT, Endocrine, Cardiovascular, Eyes, Respiratory, Psychiatric, Allergic/Imm, Heme/Lymph Last edited by Morene Rankins, CMA on 12/10/2021  8:22 AM.       ALLERGIES Allergies  Allergen Reactions  Statins Other (See Comments)    MYALGIAS   Aminoglycosides    Botulinum Toxins    Calcium Channel Blockers    Macrolides And Ketolides    Quinine Derivatives    Vioxx [Rofecoxib]     UNSPECIFIED REACTION    Ofloxacin Itching    Topical ofloxacin eyedrops to the left eye with periocular itching.   Toradol [Ketorolac Tromethamine] Rash    UNSPECIFIED REACTION     PAST MEDICAL HISTORY Past Medical History:  Diagnosis Date   Anomalous coronary artery origin    The patient underwent cardiac catheterization in 2004.  Coronaries revealed no stenosis.  However he had  aberrant takeoff of both the right and left coronaries from the anterior aortic cusp.  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 and Dr. Floyde Parkins   [name] [facility])    Murmur    Systolic murmur, December, 2012,No valvular abnormalities, echo, December, 2012   Obesity    Ocular migraine 11/30/2019   Pre-syncope    February 23, 2011   QT interval    Question prolongation QT interval, October, 2012, review of EKG dated February 04, 2011, QT interval was not prolonged   Past Surgical History:  Procedure Laterality Date   KNEE SURGERY Right    PORT-A-CATH REMOVAL N/A 11/12/2017   Procedure: REMOVAL PORT-A-CATH;  Surgeon: Clovis Riley, MD;  Location: [facility] OR;  Service: General;  Laterality: N/A;   PORTACATH PLACEMENT     TONSILLECTOMY      FAMILY HISTORY Family History  Problem Relation Age of Onset   Hypertension Mother    Fibromyalgia Mother    Cirrhosis Father    Autoimmune disease Sister    Hypertension Brother    Cirrhosis Maternal Grandmother        non alcoholic   Heart disease Other    Stroke Other    Cancer Other    Healthy Son    Healthy Son     SOCIAL HISTORY Social History   Tobacco Use   Smoking status: Never   Smokeless tobacco: Never  Vaping Use   Vaping Use: Never used  Substance Use Topics   Alcohol use: Not Currently    Alcohol/week: 0.0 standard drinks of alcohol   Drug use: No         OPHTHALMIC EXAM:  Base Eye Exam     Visual Acuity (ETDRS)       Right Left   Dist [place] 20/25 LP/HM         Tonometry (Tonopen, 8:27 AM)        Right Left   Pressure 8 11         Pupils       Pupils   Right PERRL   Left PERRL         Neuro/Psych     Oriented x3: Yes   Mood/Affect: Normal         Dilation     Left eye: 2.5% Phenylephrine,  1.0% Mydriacyl @ 8:23 AM           Slit Lamp and Fundus Exam     External Exam       Right Left   External Normal Normal         Slit Lamp Exam       Right Left   Lids/Lashes Normal Normal   Conjunctiva/Sclera White and quiet White and quiet   Cornea Clear Clear   Anterior Chamber Deep and quiet Deep and quiet   Iris Round and reactive Round and reactive   Lens 2+ Nuclear sclerosis 2+ Nuclear sclerosis, early oil droplet nsc cataract   Anterior Vitreous Normal 1+ emulsified oil, 0-1+ hemorrhage         Fundus Exam       Right Left   Posterior Vitreous  Moderate dense vitreous hemorrhage, scleral buckle and chorioretinal scarring seen peripherally 360.  Impact also from [facility] changes   Disc  No view nerve   Macula  No view macula   Periphery  Attached peripherally, buckle visible 360.  However poor details            IMAGING AND PROCEDURES  Imaging and Procedures for 12/10/21  Color Fundus Photography Optos - OU - Both Eyes       Right Eye Progression has been stable. Disc findings include normal observations. Macula : normal observations. Vessels : normal observations. Periphery : normal observations.   Left Eye Progression has worsened. Vessels : normal observations.   Notes OS moderately dense vitreous hemorrhage optic nerve faintly seen peripheral buckle indentation visible.  No details of the retina however.  OD, area of lattice degeneration inferiorly, not visualized in the lashes     B-Scan Ultrasound - OS - Left Eye       Post vitrectomy silicone oil removal, with moderate postop vitreous hemorrhage and this phakic eye.  Retina attached 360 with ultrasound imaging today.  Large floaters are emulsified silicone oil  otherwise dispersed with with 1+ postop vitreous hemorrhage             ASSESSMENT/PLAN:  Vitreous hemorrhage of left eye (HCC) 3 weeks post vitrectomy with silicone oil removal, complicated by mild to moderate vitreous hemorrhage which however is nonclearing most likely because of the phakic condition of this eye  B-scan ultrasonography confirms today of the retina is completely attached good high scleral buckle.  Dispersed hemorrhage as well as large emulsified silicone oil particles present likely dispersed from pars plicata recesses present around the ciliary body with emulsified oil retained from prior repair.  Hopefully cataract extraction with intraocular ends placement left eye will allow for another type of egress of this hemorrhage.  If the hemorrhage is not clear, within a couple of weeks repeat vitrectomy can be performed with the added benefit of removing all the retained silicone oil emulsified and dispersed.  Nuclear sclerotic cataract of left eye Progression of [place] changes due to a vitrectomized condition of the left eye.  I explained the patient that cataract surgery is critical at this juncture to allow for enhanced visualization but also potentially upon repeat vitrectomy removal of the anterior hyaloid.  These these findings and discussions were reviewed with the booklet and visualizations with clear understanding of the patient and wife.  Cataract extraction will allow subsequent vitrectomy, if required, to complete the vitrectomy portion of the procedure if the hemorrhage is not completely clear     ICD-10-CM   1. New partial retinal detachment with  multiple defects, left  H33.022 Color Fundus Photography Optos - OU - Both Eyes    B-Scan Ultrasound - OS - Left Eye    2. Vitreous hemorrhage of left eye (HCC)  H43.12     3. Nuclear sclerotic cataract of left eye  H25.12       1.  OS, dispersed moderate vitreous hemorrhage nonclearing now for 3 weeks.  Likely  slow due to the persistence of the natural phakic condition but progression of cataract as well.  This will require cataract surgery intraocular ends implantation in the left eye in order to prepare for possible second vitrectomy to clear the residual hemorrhage if it does not clear post cataract surgery  2.  Return to see [facility], specifically Dr. Wyatt Portela who has visited with the patient and evaluate the patient prior to his retinal detachment repair.  Needs cataract surgery left eye with nonpremium IOL OS.  3.  Ophthalmic Meds Ordered this visit:  No orders of the defined types were placed in this encounter.      Return in about 3 weeks (around 12/31/2021) for dilate, OS, COLOR FP, B-Scan U/S, POST OP,,,, post cataract surgery.  There are no Patient Instructions on file for this visit.   Explained the diagnoses, plan, and follow up with the patient and they expressed understanding.  Patient expressed understanding of the importance of proper follow up care.   Clent Demark Numa Schroeter M.D. Diseases & Surgery of the Retina and Vitreous Retina [facility] Di[facility] 12/10/21     Abbreviations: M myopia (nearsighted); A astigmatism; H hyperopia (farsighted); P presbyopia; Mrx spectacle prescription;  CTL contact lenses; OD right eye; OS left eye; OU both eyes  XT exotropia; ET esotropia; PEK punctate epithelial keratitis; PEE punctate epithelial erosions; DES dry eye syndrome; MGD meibomian gland dysfunction; ATs artificial tears; PFAT's preservative free artificial tears; [place] nuclear sclerotic cataract; PSC posterior subcapsular cataract; ERM epi-retinal membrane; PVD posterior vitreous detachment; RD retinal detachment; DM diabetes mellitus; DR diabetic retinopathy; NPDR non-proliferative diabetic retinopathy; PDR proliferative diabetic retinopathy; CSME clinically significant macular edema; DME diabetic macular edema; dbh dot blot hemorrhages; CWS cotton wool spot; POAG primary open angle  glaucoma; C/D cup-to-disc ratio; HVF humphrey visual field; GVF goldmann visual field; OCT optical coherence tomography; IOP intraocular pressure; BRVO Branch retinal vein occlusion; CRVO central retinal vein occlusion; CRAO central retinal artery occlusion; BRAO branch retinal artery occlusion; RT retinal tear; SB scleral buckle; PPV pars plana vitrectomy; VH Vitreous hemorrhage; PRP panretinal laser photocoagulation; IVK intravitreal kenalog; VMT vitreomacular traction; MH Macular hole;  NVD neovascularization of the disc; NVE neovascularization elsewhere; AREDS age related eye disease study; ARMD age related macular degeneration; POAG primary open angle glaucoma; EBMD epithelial/anterior basement membrane dystrophy; ACIOL anterior chamber intraocular lens; IOL intraocular lens; PCIOL posterior chamber intraocular lens; Phaco/IOL phacoemulsification with intraocular lens placement; [place] photorefractive keratectomy; LASIK laser assisted in situ keratomileusis; HTN hypertension; DM diabetes mellitus; COPD chronic obstructive pulmonary disease

## 2021-12-10 NOTE — Assessment & Plan Note (Signed)
Progression of [place] changes due to a vitrectomized condition of the left eye.  I explained the patient that cataract surgery is critical at this juncture to allow for enhanced visualization but also potentially upon repeat vitrectomy removal of the anterior hyaloid.  These these findings and discussions were reviewed with the booklet and visualizations with clear understanding of the patient and wife.  Cataract extraction will allow subsequent vitrectomy, if required, to complete the vitrectomy portion of the procedure if the hemorrhage is not completely clear

## 2021-12-10 NOTE — Assessment & Plan Note (Signed)
3 weeks post vitrectomy with silicone oil removal, complicated by mild to moderate vitreous hemorrhage which however is nonclearing most likely because of the phakic condition of this eye  B-scan ultrasonography confirms today of the retina is completely attached good high scleral buckle.  Dispersed hemorrhage as well as large emulsified silicone oil particles present likely dispersed from pars plicata recesses present around the ciliary body with emulsified oil retained from prior repair.  Hopefully cataract extraction with intraocular ends placement left eye will allow for another type of egress of this hemorrhage.  If the hemorrhage is not clear, within a couple of weeks repeat vitrectomy can be performed with the added benefit of removing all the retained silicone oil emulsified and dispersed.

## 2021-12-11 DIAGNOSIS — H04123 Dry eye syndrome of bilateral lacrimal glands: Secondary | ICD-10-CM | POA: Diagnosis not present

## 2021-12-11 DIAGNOSIS — H2512 Age-related nuclear cataract, left eye: Secondary | ICD-10-CM | POA: Diagnosis not present

## 2021-12-11 DIAGNOSIS — H40012 Open angle with borderline findings, low risk, left eye: Secondary | ICD-10-CM | POA: Diagnosis not present

## 2021-12-11 DIAGNOSIS — Z9889 Other specified postprocedural states: Secondary | ICD-10-CM | POA: Diagnosis not present

## 2021-12-11 DIAGNOSIS — H33012 Retinal detachment with single break, left eye: Secondary | ICD-10-CM | POA: Diagnosis not present

## 2021-12-11 DIAGNOSIS — E119 Type 2 diabetes mellitus without complications: Secondary | ICD-10-CM | POA: Diagnosis not present

## 2021-12-19 DIAGNOSIS — H25812 Combined forms of age-related cataract, left eye: Secondary | ICD-10-CM | POA: Diagnosis not present

## 2021-12-28 NOTE — Progress Notes (Signed)
11/12/2021     CHIEF COMPLAINT Patient presents for No chief complaint on file.     HISTORY OF PRESENT ILLNESS: [name] is a 61 y.o. male who presents to the clinic today for:   HPI   PRE OP OS SX 11/19/2021  Last edited by Silvestre Moment on 11/12/2021 11:22 AM.      Referring physician: Adaline Sill, NP [address],  [place] [address]  HISTORICAL INFORMATION:   Selected notes from the MEDICAL RECORD NUMBER    Lab Results  Component Value Date   HGBA1C 6.6 (H) 07/03/2021     CURRENT MEDICATIONS: No current outpatient medications on file. (Ophthalmic Drugs)   No current facility-administered medications for this visit. (Ophthalmic Drugs)   Current Outpatient Medications (Other)  Medication Sig   ALPRAZolam (XANAX) 0.5 MG tablet TAKE (1) TABLET THREE TIMES DAILY AS NEEDED.   celecoxib (CELEBREX) 100 MG capsule Take 1 capsule (100 mg total) by mouth 2 (two) times daily as needed.   Coenzyme Q10 (CO Q-10) 200 MG CAPS Take 200 mg by mouth daily.   DULoxetine (CYMBALTA) 30 MG capsule Take 1 capsule (30 mg total) by mouth daily.   DULoxetine (CYMBALTA) 60 MG capsule Take 1 capsule (60 mg total) by mouth daily.   ezetimibe (ZETIA) 10 MG tablet Take 10 mg by mouth daily.   FARXIGA 10 MG TABS tablet Take 10 mg by mouth daily.   FIRDAPSE 10 MG TABS Take 80 mg by mouth daily.   HYDROcodone-acetaminophen (NORCO/VICODIN) 5-325 MG tablet TAKE (1) TABLET EVERY SIX HOURS AS NEEDED. MUST LAST 28 DAYS   LEVEMIR FLEXTOUCH 100 UNIT/ML FlexPen 10 Units daily.   levothyroxine (SYNTHROID) 137 MCG tablet Take 137 mcg by mouth daily.   lisinopril (PRINIVIL,ZESTRIL) 5 MG tablet TAKE 1 TABLET (5 MG TOTAL) BY MOUTH DAILY.   Multiple Vitamin (MULTIVITAMIN WITH MINERALS) TABS tablet Take 1 tablet by mouth daily. One-A-Day   Omega-3 Fatty Acids (FISH OIL) 1200 MG CAPS Take 1,200 mg by mouth 3 (three) times daily.   predniSONE (DELTASONE) 20 MG tablet Take 40 mg by mouth daily with  breakfast. (Patient not taking: Reported on 07/03/2021)   pyridostigmine (MESTINON) 60 MG tablet Take 1 tablet (60 mg total) by mouth 3 (three) times daily.   rosuvastatin (CRESTOR) 5 MG tablet SMARTSIG:0.5 Tablet(s) By Mouth 3 Times a Week   silver sulfADIAZINE (SILVADENE) 1 % cream Apply 1 application topically daily.   valACYclovir (VALTREX) 1000 MG tablet Take 1 tablet (1,000 mg total) by mouth 2 (two) times daily.   zolpidem (AMBIEN) 10 MG tablet TAKE 1 TABLET AT BEDTIME AS NEEDED FOR SLEEP   No current facility-administered medications for this visit. (Other)      REVIEW OF SYSTEMS: ROS   Negative for: Constitutional, Gastrointestinal, Neurological, Skin, Genitourinary, Musculoskeletal, HENT, Endocrine, Cardiovascular, Eyes, Respiratory, Psychiatric, Allergic/Imm, Heme/Lymph Last edited by Silvestre Moment on 11/12/2021 11:22 AM.       ALLERGIES Allergies  Allergen Reactions   Statins Other (See Comments)    MYALGIAS   Aminoglycosides    Botulinum Toxins    Calcium Channel Blockers    Macrolides And Ketolides    Quinine Derivatives    Vioxx [Rofecoxib]     UNSPECIFIED REACTION    Ofloxacin Itching    Topical ofloxacin eyedrops to the left eye with periocular itching.   Toradol [Ketorolac Tromethamine] Rash    UNSPECIFIED REACTION     PAST MEDICAL HISTORY Past  Medical History:  Diagnosis Date   Anomalous coronary artery origin    The patient underwent cardiac catheterization in 2004.  Coronaries revealed no stenosis.  However he had aberrant takeoff of both the right and left coronaries from the anterior aortic cusp.  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 and Dr. Floyde Parkins   [name] [facility])    Murmur    Systolic murmur, December, 2012,No valvular abnormalities, echo, December, 2012   Obesity    Ocular migraine 11/30/2019   Pre-syncope    February 23, 2011   QT interval    Question prolongation QT interval, October, 2012, review of EKG dated February 04, 2011, QT interval was not prolonged   Past Surgical History:  Procedure Laterality Date   KNEE SURGERY Right    PORT-A-CATH REMOVAL N/A 11/12/2017   Procedure: REMOVAL PORT-A-CATH;  Surgeon: Clovis Riley, MD;  Location: [facility] OR;  Service: General;  Laterality: N/A;   PORTACATH PLACEMENT     TONSILLECTOMY      FAMILY HISTORY Family History  Problem Relation Age of Onset   Hypertension Mother    Fibromyalgia Mother    Cirrhosis Father    Autoimmune disease Sister    Hypertension Brother    Cirrhosis Maternal Grandmother        non alcoholic   Heart disease Other    Stroke Other    Cancer Other    Healthy Son    Healthy Son     SOCIAL HISTORY Social History   Tobacco Use   Smoking status: Never   Smokeless tobacco: Never  Vaping Use   Vaping Use: Never used  Substance Use Topics   Alcohol use: Not Currently    Alcohol/week: 0.0 standard drinks of alcohol   Drug use: No         OPHTHALMIC EXAM:  Base Eye Exam     Visual Acuity (ETDRS)       Right Left   Dist cc 20/20 20/150 -2   Dist ph cc  20/100 -1    Correction: Glasses         Tonometry (Tonopen, 11:27 AM)       Right Left   Pressure 10 9         Pupils       Pupils APD   Right PERRL None   Left PERRL None         Visual Fields       Left Right    Full Full         Extraocular Movement       Right Left    Full, Ortho Full, Ortho         Neuro/Psych     Oriented x3: Yes   Mood/Affect: Normal         Dilation     Both eyes: 1.0% Mydriacyl @ 11:27 AM            Slit Lamp and  Fundus Exam     External Exam       Right Left   External Normal Normal         Slit Lamp Exam       Right Left   Lids/Lashes Normal Normal   Conjunctiva/Sclera White and quiet White and quiet   Cornea Clear Clear   Anterior Chamber Deep and quiet Deep and quiet   Iris Round and reactive Round and reactive   Lens 2+ Nuclear sclerosis 2+ Nuclear sclerosis   Anterior Vitreous Normal Normal         Fundus Exam       Right Left   Posterior Vitreous  Clear, oil fill   Disc  Normal   C/D Ratio  0.25   Macula  Cystoid macular edema   Vessels  Normal   Periphery  Good buckling effect, large tire inferior, encircled, retina flat inferiorly.            IMAGING AND PROCEDURES  Imaging and Procedures for 12/28/21           ASSESSMENT/PLAN:  New partial retinal detachment with multiple defects, left Retained oil, preop done for vit removal of oil      ICD-10-CM   1. New partial retinal detachment with multiple defects, left  H33.022       1.  2.  3.  Ophthalmic Meds Ordered this visit:  Meds ordered this encounter  Medications   tobramycin (TOBREX) 0.3 % ophthalmic solution    Sig: Place 1 drop into the left eye every 4 (four) hours for 21 days.    Dispense:  5 mL    Refill:  0   prednisoLONE acetate (PRED FORTE) 1 % ophthalmic suspension    Sig: Place 1 drop into the left eye 4 (four) times daily for 21 days.    Dispense:  4.2 mL    Refill:  0   tobramycin (TOBREX) 0.3 % ophthalmic solution    Sig: Place 1 drop into the left eye in the morning, at noon, in the evening, and at bedtime for 21 days.    Dispense:  4.2 mL    Refill:  0       No follow-ups on file.  There are no Patient Instructions on file for this visit.   Explained the diagnoses, plan, and follow up with the patient and they expressed understanding.  Patient expressed understanding of the importance of proper follow up care.   Clent Demark Nycere Presley M.D. Diseases  & Surgery of the Retina and Vitreous Retina & Diabetic [place] 12/28/21     Abbreviations: M myopia (nearsighted); A astigmatism; H hyperopia (farsighted); P presbyopia; Mrx spectacle prescription;  CTL contact lenses; OD right eye; OS left eye; OU both eyes  XT exotropia; ET esotropia; PEK punctate epithelial keratitis; PEE punctate epithelial erosions; DES dry eye syndrome; MGD meibomian gland dysfunction; ATs artificial tears; PFAT's preservative free artificial tears; [place] nuclear sclerotic cataract; PSC posterior subcapsular cataract; ERM epi-retinal membrane; PVD posterior vitreous detachment; RD retinal detachment; DM diabetes mellitus; DR diabetic retinopathy; NPDR non-proliferative diabetic retinopathy; PDR proliferative diabetic retinopathy; CSME clinically significant macular edema; DME diabetic macular edema; dbh dot blot hemorrhages; CWS cotton wool spot; POAG primary open angle glaucoma; C/D cup-to-disc ratio; HVF humphrey visual field; GVF goldmann visual field; OCT optical coherence tomography; IOP intraocular pressure; BRVO Branch retinal vein occlusion; CRVO central retinal vein occlusion; CRAO central retinal artery occlusion; BRAO branch retinal artery occlusion; RT retinal tear; SB  scleral buckle; PPV pars plana vitrectomy; VH Vitreous hemorrhage; PRP panretinal laser photocoagulation; IVK intravitreal kenalog; VMT vitreomacular traction; MH Macular hole;  NVD neovascularization of the disc; NVE neovascularization elsewhere; AREDS age related eye disease study; ARMD age related macular degeneration; POAG primary open angle glaucoma; EBMD epithelial/anterior basement membrane dystrophy; ACIOL anterior chamber intraocular lens; IOL intraocular lens; PCIOL posterior chamber intraocular lens; Phaco/IOL phacoemulsification with intraocular lens placement; [place] photorefractive keratectomy; LASIK laser assisted in situ keratomileusis; HTN hypertension; DM diabetes mellitus; COPD chronic  obstructive pulmonary disease

## 2021-12-28 NOTE — Assessment & Plan Note (Signed)
Retained oil, preop done for vit removal of oil

## 2021-12-31 ENCOUNTER — Encounter (INDEPENDENT_AMBULATORY_CARE_PROVIDER_SITE_OTHER): Payer: Self-pay | Admitting: Ophthalmology

## 2021-12-31 ENCOUNTER — Ambulatory Visit (INDEPENDENT_AMBULATORY_CARE_PROVIDER_SITE_OTHER): Payer: Medicare Other | Admitting: Ophthalmology

## 2021-12-31 DIAGNOSIS — H5231 Anisometropia: Secondary | ICD-10-CM | POA: Diagnosis not present

## 2021-12-31 DIAGNOSIS — H33022 Retinal detachment with multiple breaks, left eye: Secondary | ICD-10-CM

## 2021-12-31 DIAGNOSIS — Z961 Presence of intraocular lens: Secondary | ICD-10-CM | POA: Diagnosis not present

## 2021-12-31 DIAGNOSIS — H4312 Vitreous hemorrhage, left eye: Secondary | ICD-10-CM

## 2021-12-31 DIAGNOSIS — H5232 Aniseikonia: Secondary | ICD-10-CM

## 2021-12-31 DIAGNOSIS — H35352 Cystoid macular degeneration, left eye: Secondary | ICD-10-CM

## 2021-12-31 NOTE — Assessment & Plan Note (Addendum)
Looks great post surgery Dr. Wyatt Portela, median now clearing as I expected as another modality for the vitreous hemorrhage to dispersed through the anterior chamber has occurred.  Clear media is now present.  Continue current medications as prescribed by Dr. Wyatt Portela.

## 2021-12-31 NOTE — Assessment & Plan Note (Signed)
Much improved, post cataract surgery as another mode and egress of the vitreous hemorrhage has developed post cataract surgery.  Looks great today retina attached

## 2021-12-31 NOTE — Assessment & Plan Note (Signed)
This cataract surgery left eye and phakic right eye with spectacle correction.  Likely this is present although I do not have his current refraction in front of me.  He does report difficulty with his glasses with the left eye unchanged thus far prescription wise.  I explained he is likely continue to do better with contact lens right eye and no glasses left eye except for perhaps reading

## 2021-12-31 NOTE — Progress Notes (Signed)
12/31/2021     CHIEF COMPLAINT Patient presents for  Chief Complaint  Patient presents with   Post-op Follow-up      HISTORY OF PRESENT ILLNESS: [name] is a 61 y.o. male who presents to the clinic today for:   HPI     Post-op Follow-up           Laterality: left eye   Discomfort: Negative for pain, itching, foreign body sensation, tearing, discharge, floaters and none   Vision: is improved and is stable         Comments   3 WEEKS FOR DILATE OS, COLOR FP, B-SCAN U/S, POST OP,,POST CATARACT SX. Pt stated vision has improved. Pt had cataract sx on September 8th for OS. Pt stated, "When I took off my glasses, its not as blurry. After my cataract sx, I could not see anything at all. I saw Dr. Katy Fitch and he was a little disappointed. He told me that he wanted to go back to do a little flush but my vision has slightly improved since then. I know I'm supposed to floaters after my surgery but I still see these little spots that I think are oil because it jumps around a little sometimes. Im very sensitive to light." Pt has allergy to Ofloxacin        Last edited by Silvestre Moment on 12/31/2021 10:35 AM.      Referring physician: Debbra Riding, MD [address] [place],  [place] [address]  HISTORICAL INFORMATION:   Selected notes from the MEDICAL RECORD NUMBER    Lab Results  Component Value Date   HGBA1C 6.6 (H) 07/03/2021     CURRENT MEDICATIONS: No current outpatient medications on file. (Ophthalmic Drugs)   No current facility-administered medications for this visit. (Ophthalmic Drugs)   Current Outpatient Medications (Other)  Medication Sig   ALPRAZolam (XANAX) 0.5 MG tablet TAKE (1) TABLET THREE TIMES DAILY AS NEEDED.   celecoxib (CELEBREX) 100 MG capsule Take 1 capsule (100 mg total) by mouth 2 (two) times daily as needed.   Coenzyme Q10 (CO Q-10) 200 MG CAPS Take 200 mg by mouth daily.   DULoxetine (CYMBALTA) 30 MG capsule Take 1 capsule (30 mg  total) by mouth daily.   DULoxetine (CYMBALTA) 60 MG capsule Take 1 capsule (60 mg total) by mouth daily.   ezetimibe (ZETIA) 10 MG tablet Take 10 mg by mouth daily.   FARXIGA 10 MG TABS tablet Take 10 mg by mouth daily.   FIRDAPSE 10 MG TABS Take 80 mg by mouth daily.   HYDROcodone-acetaminophen (NORCO/VICODIN) 5-325 MG tablet TAKE (1) TABLET EVERY SIX HOURS AS NEEDED. MUST LAST 28 DAYS   LEVEMIR FLEXTOUCH 100 UNIT/ML FlexPen 10 Units daily.   levothyroxine (SYNTHROID) 137 MCG tablet Take 137 mcg by mouth daily.   lisinopril (PRINIVIL,ZESTRIL) 5 MG tablet TAKE 1 TABLET (5 MG TOTAL) BY MOUTH DAILY.   Multiple Vitamin (MULTIVITAMIN WITH MINERALS) TABS tablet Take 1 tablet by mouth daily. One-A-Day   Omega-3 Fatty Acids (FISH OIL) 1200 MG CAPS Take 1,200 mg by mouth 3 (three) times daily.   predniSONE (DELTASONE) 20 MG tablet Take 40 mg by mouth daily with breakfast. (Patient not taking: Reported on 07/03/2021)   pyridostigmine (MESTINON) 60 MG tablet Take 1 tablet (60 mg total) by mouth 3 (three) times daily.   rosuvastatin (CRESTOR) 5 MG tablet SMARTSIG:0.5 Tablet(s) By Mouth 3 Times a Week   silver sulfADIAZINE (SILVADENE) 1 % cream Apply  1 application topically daily.   valACYclovir (VALTREX) 1000 MG tablet Take 1 tablet (1,000 mg total) by mouth 2 (two) times daily.   zolpidem (AMBIEN) 10 MG tablet TAKE 1 TABLET AT BEDTIME AS NEEDED FOR SLEEP   No current facility-administered medications for this visit. (Other)      REVIEW OF SYSTEMS: ROS   Negative for: Constitutional, Gastrointestinal, Neurological, Skin, Genitourinary, Musculoskeletal, HENT, Endocrine, Cardiovascular, Eyes, Respiratory, Psychiatric, Allergic/Imm, Heme/Lymph Last edited by Silvestre Moment on 12/31/2021 10:30 AM.       ALLERGIES Allergies  Allergen Reactions   Statins Other (See Comments)    MYALGIAS   Aminoglycosides    Botulinum Toxins    Calcium Channel Blockers    Macrolides And Ketolides    Quinine Derivatives     Vioxx [Rofecoxib]     UNSPECIFIED REACTION    Ofloxacin Itching    Topical ofloxacin eyedrops to the left eye with periocular itching.   Toradol [Ketorolac Tromethamine] Rash    UNSPECIFIED REACTION     PAST MEDICAL HISTORY Past Medical History:  Diagnosis Date   Anomalous coronary artery origin    The patient underwent cardiac catheterization in 2004.  Coronaries revealed no stenosis.  However he had aberrant takeoff of both the right and left coronaries from the anterior aortic cusp.  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 and Dr. Floyde Parkins   [name] [facility])    Murmur    Systolic murmur, December, 2012,No valvular abnormalities, echo, December, 2012   Nuclear sclerotic cataract of left eye 08/06/2021   Obesity    Ocular migraine 11/30/2019   Pre-syncope    February 23, 2011   QT interval    Question prolongation QT interval, October, 2012, review of EKG dated February 04, 2011, QT interval was not prolonged   Past Surgical History:  Procedure Laterality Date   KNEE SURGERY Right    PORT-A-CATH REMOVAL N/A 11/12/2017   Procedure: REMOVAL PORT-A-CATH;  Surgeon: Clovis Riley, MD;  Location: [place];  Service: General;  Laterality: N/A;   PORTACATH PLACEMENT     TONSILLECTOMY      FAMILY HISTORY Family History  Problem Relation Age of Onset   Hypertension Mother    Fibromyalgia Mother    Cirrhosis Father    Autoimmune disease Sister    Hypertension Brother    Cirrhosis Maternal Grandmother        non alcoholic   Heart disease Other    Stroke Other     Cancer Other    Healthy Son    Healthy Son     SOCIAL HISTORY Social History   Tobacco Use   Smoking status: Never   Smokeless tobacco: Never  Vaping Use   Vaping Use: Never used  Substance Use Topics   Alcohol use: Not Currently    Alcohol/week: 0.0 standard drinks of alcohol   Drug use: No         OPHTHALMIC EXAM:  Base Eye Exam  Visual Acuity (ETDRS)       Right Left   Dist cc 20/20 -1 20/100 -1   Dist ph cc  20/40 -2    Correction: Glasses         Tonometry (Tonopen, 10:39 AM)       Right Left   Pressure 10 9         Pupils       Pupils APD   Right PERRL None   Left PERRL None         Visual Fields       Left Right    Full Full         Extraocular Movement       Right Left    Full, Ortho Full, Ortho         Neuro/Psych     Oriented x3: Yes   Mood/Affect: Normal         Dilation     Left eye: 2.5% Phenylephrine, 1.0% Mydriacyl @ 10:38 AM           Slit Lamp and Fundus Exam     External Exam       Right Left   External Normal Normal         Slit Lamp Exam       Right Left   Lids/Lashes Normal Normal   Conjunctiva/Sclera White and quiet White and quiet   Cornea Clear Clear   Anterior Chamber Deep and quiet Deep and quiet   Iris Round and reactive Round and reactive   Lens 2+ Nuclear sclerosis Centered posterior chamber intraocular lens   Anterior Vitreous Normal trace oil         Fundus Exam       Right Left   Posterior Vitreous  heme clear   Disc  Normal   C/D Ratio  0.3   Macula  attached   Vessels  Normal   Periphery  Attached peripherally, buckle visible 360.  However poor details            IMAGING AND PROCEDURES  Imaging and Procedures for 12/31/21  Color Fundus Photography Optos - OU - Both Eyes       Right Eye Progression has been stable. Disc findings include normal observations. Macula : normal observations. Vessels : normal observations. Periphery : normal  observations.   Left Eye Progression has improved. Vessels : normal observations.   Notes OS, attached. Minor CME overall improved   OD, area of lattice degeneration inferiorly, not visualized in the lashes     B-Scan Ultrasound - OS - Left Eye       Not done today and the computer system does not allow this to be added without an interpretation.  No charge because this was not done and not requiring     OCT, Retina - OU - Both Eyes       Right Eye Quality was good. Scan locations included subfoveal. Central Foveal Thickness: 273. Progression has been stable. Findings include normal foveal contour.   Left Eye Quality was good. Scan locations included subfoveal. Central Foveal Thickness: 371. Progression has worsened. Findings include cystoid macular edema.   Notes OS, macula attached.  And much less CME.  Much improved thickening.  Will observe             ASSESSMENT/PLAN:  Vitreous hemorrhage of left eye ([place]) Much improved, post cataract surgery as another mode and egress of the vitreous hemorrhage has developed post cataract surgery.  Looks great today retina attached  Pseudophakia of left eye Looks great post surgery Dr. Wyatt Portela, median now clearing as I expected as another modality for the vitreous hemorrhage to dispersed through the anterior chamber has occurred.  Clear media is now present.  Continue current medications as prescribed by Dr. Wyatt Portela.  New partial retinal detachment with multiple defects, left Condition resolved retina attached  Anisometropia and aniseikonia This cataract surgery left eye and phakic right eye with spectacle correction.  Likely this is present although I do not have his current refraction in front of me.  He does report difficulty with his glasses with the left eye unchanged thus far prescription wise.  I explained he is likely continue to do better with contact lens right eye and no glasses left eye except for  perhaps reading     ICD-10-CM   1. New partial retinal detachment with multiple defects, left  H33.022 Color Fundus Photography Optos - OU - Both Eyes    B-Scan Ultrasound - OS - Left Eye    2. Vitreous hemorrhage of left eye ([facility])  H43.12     3. Pseudophakia of left eye  Z96.1     4. Anisometropia and aniseikonia  H52.31    H52.32     5. Cystoid macular edema of left eye  H35.352 OCT, Retina - OU - Both Eyes      1.  OS, retina attached.  Explained the patient that he has "anisometropia" which means that there is a variance and difference between the refractive error in each eye.  This compares to each other.  This makes glasses unwieldy because of blurred vision induced in the left eye after cataract surgery with normal vision in the right eye.  He is already discovered that contact lens wear is more tolerable in the right eye.  At times patients do have undergo cataract surgery in the fellow eye to make and equal.  I will leave that discussion and other remedies potential for his refractive needs with Dr. Wyatt Portela and the patient.  2.  Perifoveal CME likely continue to improve  3.  Patient to continue on topical medications as per Dr. Katy Fitch  Ophthalmic Meds Ordered this visit:  No orders of the defined types were placed in this encounter.      Return in about 3 months (around 04/01/2022) for DILATE OU, COLOR FP, OCT.  There are no Patient Instructions on file for this visit.   Explained the diagnoses, plan, and follow up with the patient and they expressed understanding.  Patient expressed understanding of the importance of proper follow up care.   Clent Demark Heather Mckendree M.D. Diseases & Surgery of the Retina and Vitreous Retina & Diabetic [place] 12/31/21     Abbreviations: M myopia (nearsighted); A astigmatism; H hyperopia (farsighted); P presbyopia; Mrx spectacle prescription;  CTL contact lenses; OD right eye; OS left eye; OU both eyes  XT exotropia; ET esotropia;  PEK punctate epithelial keratitis; PEE punctate epithelial erosions; DES dry eye syndrome; MGD meibomian gland dysfunction; ATs artificial tears; PFAT's preservative free artificial tears; [place] nuclear sclerotic cataract; PSC posterior subcapsular cataract; ERM epi-retinal membrane; PVD posterior vitreous detachment; RD retinal detachment; DM diabetes mellitus; DR diabetic retinopathy; NPDR non-proliferative diabetic retinopathy; PDR proliferative diabetic retinopathy; CSME clinically significant macular edema; DME diabetic macular edema; dbh dot blot hemorrhages; CWS cotton wool spot; POAG primary open angle glaucoma; C/D cup-to-disc ratio; HVF humphrey visual field; GVF goldmann visual field; OCT optical coherence  tomography; IOP intraocular pressure; BRVO Branch retinal vein occlusion; CRVO central retinal vein occlusion; CRAO central retinal artery occlusion; BRAO branch retinal artery occlusion; RT retinal tear; SB scleral buckle; PPV pars plana vitrectomy; VH Vitreous hemorrhage; PRP panretinal laser photocoagulation; IVK intravitreal kenalog; VMT vitreomacular traction; MH Macular hole;  NVD neovascularization of the disc; NVE neovascularization elsewhere; AREDS age related eye disease study; ARMD age related macular degeneration; POAG primary open angle glaucoma; EBMD epithelial/anterior basement membrane dystrophy; ACIOL anterior chamber intraocular lens; IOL intraocular lens; PCIOL posterior chamber intraocular lens; Phaco/IOL phacoemulsification with intraocular lens placement; [place] photorefractive keratectomy; LASIK laser assisted in situ keratomileusis; HTN hypertension; DM diabetes mellitus; COPD chronic obstructive pulmonary disease

## 2021-12-31 NOTE — Assessment & Plan Note (Signed)
Condition resolved retina attached

## 2022-01-08 ENCOUNTER — Encounter: Payer: Self-pay | Admitting: Neurology

## 2022-01-08 ENCOUNTER — Ambulatory Visit (INDEPENDENT_AMBULATORY_CARE_PROVIDER_SITE_OTHER): Payer: Medicare Other | Admitting: Neurology

## 2022-01-08 VITALS — BP 168/90 | HR 61 | Ht 70.0 in | Wt 216.5 lb

## 2022-01-08 DIAGNOSIS — H04129 Dry eye syndrome of unspecified lacrimal gland: Secondary | ICD-10-CM

## 2022-01-08 DIAGNOSIS — R5383 Other fatigue: Secondary | ICD-10-CM

## 2022-01-08 DIAGNOSIS — G7080 Lambert-Eaton syndrome, unspecified: Secondary | ICD-10-CM | POA: Diagnosis not present

## 2022-01-08 NOTE — Progress Notes (Signed)
ASSESSMENT AND PLAN  [name] is a 61 y.o. 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 Queen Slough at [facility],  Mestinon, he complains of GI side effect with high-dose, also has mild bradycardia, advised him upper limit to 60 mg 3 tablets a day, may try 30 mg 4 times a day,  Repeat laboratory  Depression body achy pain  Improved with higher dose of Cymbalta 90 mg daily  At risk for obstructive sleep apnea  Narrow oropharyngeal space, loud snoring, vivid dreams, acting out of dreams, daytime fatigue, sleepiness retinal detachment      DIAGNOSTIC DATA (LABS, IMAGING, TESTING) - I reviewed patient records, labs, notes, testing and imaging myself where available.   MEDICAL HISTORY:  [name], is a 61 year old male, follow-up for [name], was diagnosed in 81.  Previously patient of Dr. Jannifer Franklin,  I reviewed and summarized multiple previous notes including [facility] neuromuscular specialist Dr. Deberah Pelton note.  He presented with lower extremity weakness, later progressed to involve his upper extremity, also had ptosis, diplopia, dry eye and dry mouth.  He underwent multiple evaluation for malignancy, all have been negative.  His diagnosis is based on positive P/Q type voltage-gated calcium channel antibodies.  He had extensive treatment history, lack of response to prednisone, Cytoxan, Imuran, CellCept.  He did respond to IVIG, he had been receiving IVIG on a weekly basis from 1993-2006.  He was started on 3 4 DAP in 2001, result in sustained  improvement.  Mestinon was started in 2002, he does complains of GI side effect with higher dose, also have a history of bradycardia.  From 2005, he has been treated with rituximab, initially was receiving 1000 mg IV infusion 2 weeks apart every 6 months.  From December 2022, he complains of returning generalized weakness fatigue at the end of the 4 months.  The interval has been shortened to every 5 months, he tolerated well.  Most recent office visit with Dr. Queen Slough was in November 2022, he did report with each infusion he felt a boost of energy, less dyspnea on exertion, he can walk better, but it does not help his dry eye and dry mouth much.  Since 3,4-DAP is no longer on the market, it was replaced by Firdapse, 10 mg 2 tablets 4 times a day,  He was also seen by [facility] rheumatologist Dr. Flonnie Overman, for positive ANA, double-stranded DNA,  Rituximab infusion through intrafusion on October 14, 2020 500 units, July 26 500 units, December 13 500/December/27 500 units, next infusion will be on May 30/June 13,  Patient reported that when he is taking prednisone along with intra fusion, he does better, but understand the potential long-term side effect with prednisone, he has diabetes, use insulin shots  He complains of frequent painful blister broke out at the perioral area, was treated with Valtrex as needed  In addition, he complains of extreme stress at home, Cymbalta 60 mg daily has helped his body achy pain and depression, and work part-time job as an Stage manager for [employer], feel fulfilled by helping people and animals.  He still have significant dry mouth, intermittent oblique double vision,  UPDATE Sept 28 2023: Lambert-Eaton symptoms is overall under reasonable control, received rituximab infusion in July 2023, was seen by Dr. Artemio Aly March 04, 2021, continued on Ruzurgi 40 mg twice a day, Mestinon 60 mg twice a day, he reported the benefit following infusion,  He  has terrible dry eye, was seen by rheumatology at [facility] in the past, consider diagnosis of Sjogren's disease, in the setting of positive ANA,  He was seen by ophthalmologist Dr. Deloria Lair, after diagnosis of left eye vitreous hemorrhage, new partial retinal detachment with multiple defect, now left vision is improved,  His wife noticed since 2023 he has vivid dreams during sleep, excessive movement, snoring, excessive daytime sleepiness, fatigue,   Higher dose of Cymbalta 90 mg daily helped his body achy pain after   PHYSICAL EXAM:   Vitals:   01/08/22 1340  BP: (!) 168/90  Pulse: 61     PHYSICAL EXAMNIATION:  Gen: NAD, conversant, well nourised, well groomed                     Cardiovascular: Regular rate rhythm, no peripheral edema, warm, nontender. Eyes: Conjunctivae clear without exudates or hemorrhage Neck: Supple, no carotid bruits. Pulmonary: Clear to auscultation bilaterally   NEUROLOGICAL EXAM:  MENTAL STATUS: Speech/cognition: Awake, alert, oriented to history taking and casual conversation   CRANIAL NERVES: CN II: Visual fields are full to confrontation. Pupils are round equal and briskly reactive to light. CN III, IV, VI: extraocular movement are normal.  Mild left side fatigable ptosis, cover and uncover testing showed right superior exophoria/left exophoria, CN V: Facial sensation is intact to light touch CN VII: Face is symmetric with normal eye closure  CN VIII: Hearing is normal to causal conversation. CN IX, X: Phonation is normal. CN XI: Head turning and shoulder shrug are intact CN XII: Tongue movement is normal, has visible dry oral mucus  MOTOR: There is no pronator drift of out-stretched arms. Muscle bulk and tone are normal. Muscle strength is normal.  REFLEXES: Reflexes are absent and symmetric at the biceps, triceps, knees, and ankles. Plantar responses are flexor.  SENSORY: Intact to light touch, pinprick and vibratory sensation are  intact in fingers and toes.  COORDINATION: There is no trunk or limb dysmetria noted.  GAIT/STANCE: He can get up from seated position arm crossed  REVIEW OF SYSTEMS:  Full 14 system review of systems performed and notable only for as above All other review of systems were negative.   ALLERGIES: Allergies  Allergen Reactions   Statins Other (See Comments)    MYALGIAS   Aminoglycosides    Botulinum Toxins    Calcium Channel Blockers    Macrolides And Ketolides    Quinine Derivatives    Vioxx [Rofecoxib]     UNSPECIFIED REACTION    Ofloxacin Itching    Topical ofloxacin eyedrops to the left eye with periocular itching.   Toradol [Ketorolac Tromethamine] Rash    UNSPECIFIED REACTION     HOME MEDICATIONS: Current Outpatient Medications  Medication Sig Dispense Refill   ALPRAZolam (XANAX) 0.5 MG tablet TAKE (1) TABLET THREE TIMES DAILY AS NEEDED. 90 tablet 3   celecoxib (CELEBREX) 100 MG capsule Take 1 capsule (100 mg total) by mouth 2 (two) times daily as needed. 30 capsule 0   Coenzyme Q10 (CO Q-10) 200 MG CAPS Take 200 mg by mouth daily.  DULoxetine (CYMBALTA) 30 MG capsule Take 1 capsule (30 mg total) by mouth daily. 90 capsule 3   DULoxetine (CYMBALTA) 60 MG capsule Take 1 capsule (60 mg total) by mouth daily. 90 capsule 3   ezetimibe (ZETIA) 10 MG tablet Take 10 mg by mouth daily.     FARXIGA 10 MG TABS tablet Take 10 mg by mouth daily.     FIRDAPSE 10 MG TABS Take 80 mg by mouth daily.     HYDROcodone-acetaminophen (NORCO/VICODIN) 5-325 MG tablet TAKE (1) TABLET EVERY SIX HOURS AS NEEDED. MUST LAST 28 DAYS 60 tablet 0   LEVEMIR FLEXTOUCH 100 UNIT/ML FlexPen 10 Units daily.     levothyroxine (SYNTHROID) 137 MCG tablet Take 137 mcg by mouth daily.     lisinopril (PRINIVIL,ZESTRIL) 5 MG tablet TAKE 1 TABLET (5 MG TOTAL) BY MOUTH DAILY. 30 tablet 0   Multiple Vitamin (MULTIVITAMIN WITH MINERALS) TABS tablet Take 1 tablet by mouth daily. One-A-Day     Omega-3 Fatty Acids  (FISH OIL) 1200 MG CAPS Take 1,200 mg by mouth 3 (three) times daily.     predniSONE (DELTASONE) 20 MG tablet Take 40 mg by mouth daily with breakfast. (Patient not taking: Reported on 07/03/2021)     pyridostigmine (MESTINON) 60 MG tablet Take 1 tablet (60 mg total) by mouth 3 (three) times daily. 270 tablet 3   rosuvastatin (CRESTOR) 5 MG tablet SMARTSIG:0.5 Tablet(s) By Mouth 3 Times a Week     silver sulfADIAZINE (SILVADENE) 1 % cream Apply 1 application topically daily. 50 g 0   valACYclovir (VALTREX) 1000 MG tablet Take 1 tablet (1,000 mg total) by mouth 2 (two) times daily. 60 tablet 0   zolpidem (AMBIEN) 10 MG tablet TAKE 1 TABLET AT BEDTIME AS NEEDED FOR SLEEP 30 tablet 2   No current facility-administered medications for this visit.    PAST MEDICAL HISTORY: Past Medical History:  Diagnosis Date   Anomalous coronary artery origin    The patient underwent cardiac catheterization in 2004.  Coronaries revealed no stenosis.  However he had aberrant takeoff of both the right and left coronaries from the anterior aortic cusp.  There was no evidence of renal artery stenosis   Arthritis    Back pain    Broken fibula    right   Depression    Diabetes mellitus without complication ([place])    Dyslipidemia    Dyslipidemia    Ejection fraction    EF 60-65%, echo, March 18, 2011   H/O radioactive iodine thyroid ablation    History of plasmapheresis    Hypertension    Hyperthyroidism    November, 201 to   Lambert-Eaton myasthenic syndrome ([place])    Followed at [facility]. and Dr. Floyde Parkins   [name] [facility])    Murmur    Systolic murmur, December, 2012,No valvular abnormalities, echo, December, 2012   Nuclear sclerotic cataract of left eye 08/06/2021   Obesity    Ocular migraine 11/30/2019   Pre-syncope    February 23, 2011   QT interval    Question prolongation QT interval, October, 2012, review of EKG dated February 04, 2011, QT interval was not prolonged    PAST SURGICAL  HISTORY: Past Surgical History:  Procedure Laterality Date   KNEE SURGERY Right    PORT-A-CATH REMOVAL N/A 11/12/2017   Procedure: REMOVAL PORT-A-CATH;  Surgeon: Clovis Riley, MD;  Location: [place];  Service: General;  Laterality: N/A;   [place]  FAMILY HISTORY: Family History  Problem Relation Age of Onset   Hypertension Mother    Fibromyalgia Mother    Cirrhosis Father    Autoimmune disease Sister    Hypertension Brother    Cirrhosis Maternal Grandmother        non alcoholic   Heart disease Other    Stroke Other    Cancer Other    Healthy Son    Healthy Son     SOCIAL HISTORY: Social History   Socioeconomic History   Marital status: Married    Spouse name: Caren Griffins   Number of children: 2   Years of education: 12   Highest education level: Not on file  Occupational History   Occupation: Print production planner: [employer]  Tobacco Use   Smoking status: Never   Smokeless tobacco: Never  Vaping Use   Vaping Use: Never used  Substance and Sexual Activity   Alcohol use: Not Currently    Alcohol/week: 0.0 standard drinks of alcohol   Drug use: No   Sexual activity: Yes  Other Topics Concern   Not on file  Social History Narrative   Patient lives at home with his wife Caren Griffins).   Disabled.   Education high school.   Right handed.   Caffeine None .   Social Determinants of Health   Financial Resource Strain: Not on file  Food Insecurity: Not on file  Transportation Needs: Not on file  Physical Activity: Not on file  Stress: Not on file  Social Connections: Not on file  Intimate Partner Violence: Not on file     Thankfully no complaints to                                                                                                                                                             Marcial Pacas, M.D. Ph.D.  [facility] [address] [place], [place]  [address] Ph: [phone] Fax: [phone]  CC:  Wannetta Sender, [place] [facility] [address] [place],  [place] [address]  Adaline Sill, NP

## 2022-01-09 LAB — COMPREHENSIVE METABOLIC PANEL
ALT: 37 IU/L (ref 0–44)
AST: 32 IU/L (ref 0–40)
Albumin/Globulin Ratio: 2.5 — ABNORMAL HIGH (ref 1.2–2.2)
Albumin: 4.8 g/dL (ref 3.8–4.9)
Alkaline Phosphatase: 73 IU/L (ref 44–121)
BUN/Creatinine Ratio: 15 (ref 9–20)
BUN: 14 mg/dL (ref 6–24)
Bilirubin Total: 0.3 mg/dL (ref 0.0–1.2)
CO2: 24 mmol/L (ref 20–29)
Calcium: 9.6 mg/dL (ref 8.7–10.2)
Chloride: 100 mmol/L (ref 96–106)
Creatinine, Ser: 0.95 mg/dL (ref 0.76–1.27)
Globulin, Total: 1.9 g/dL (ref 1.5–4.5)
Glucose: 67 mg/dL — ABNORMAL LOW (ref 70–99)
Potassium: 4.1 mmol/L (ref 3.5–5.2)
Sodium: 141 mmol/L (ref 134–144)
Total Protein: 6.7 g/dL (ref 6.0–8.5)
eGFR: 93 mL/min/{1.73_m2} (ref >59)

## 2022-01-09 LAB — CBC WITH DIFFERENTIAL/PLATELET
Basophils Absolute: 0.1 10*3/uL (ref 0.0–0.2)
Basos: 1 %
EOS (ABSOLUTE): 0.1 10*3/uL (ref 0.0–0.4)
Eos: 1 %
Hematocrit: 44.1 % (ref 37.5–51.0)
Hemoglobin: 15.4 g/dL (ref 13.0–17.7)
Immature Grans (Abs): 0 10*3/uL (ref 0.0–0.1)
Immature Granulocytes: 0 %
Lymphocytes Absolute: 1.5 10*3/uL (ref 0.7–3.1)
Lymphs: 20 %
MCH: 31.4 pg (ref 26.6–33.0)
MCHC: 34.9 g/dL (ref 31.5–35.7)
MCV: 90 fL (ref 79–97)
Monocytes Absolute: 0.9 10*3/uL (ref 0.1–0.9)
Monocytes: 13 %
Neutrophils Absolute: 4.8 10*3/uL (ref 1.4–7.0)
Neutrophils: 65 %
Platelets: 179 10*3/uL (ref 150–450)
RBC: 4.9 x10E6/uL (ref 4.14–5.80)
RDW: 12.9 % (ref 11.6–15.4)
WBC: 7.3 10*3/uL (ref 3.4–10.8)

## 2022-01-09 LAB — TSH: TSH: 0.158 u[IU]/mL — ABNORMAL LOW (ref 0.450–4.500)

## 2022-01-09 LAB — IGG, IGA, IGM
IgA/Immunoglobulin A, Serum: 277 mg/dL (ref 90–386)
IgG (Immunoglobin G), Serum: 741 mg/dL (ref 603–1613)
IgM (Immunoglobulin M), Srm: 54 mg/dL (ref 20–172)

## 2022-01-09 LAB — CK: Total CK: 169 U/L (ref 41–331)

## 2022-01-26 DIAGNOSIS — Z961 Presence of intraocular lens: Secondary | ICD-10-CM | POA: Diagnosis not present

## 2022-01-26 DIAGNOSIS — H35352 Cystoid macular degeneration, left eye: Secondary | ICD-10-CM | POA: Diagnosis not present

## 2022-01-26 DIAGNOSIS — H25811 Combined forms of age-related cataract, right eye: Secondary | ICD-10-CM | POA: Diagnosis not present

## 2022-01-26 DIAGNOSIS — E119 Type 2 diabetes mellitus without complications: Secondary | ICD-10-CM | POA: Diagnosis not present

## 2022-02-23 DIAGNOSIS — E785 Hyperlipidemia, unspecified: Secondary | ICD-10-CM | POA: Diagnosis not present

## 2022-02-23 DIAGNOSIS — I10 Essential (primary) hypertension: Secondary | ICD-10-CM | POA: Diagnosis not present

## 2022-02-23 DIAGNOSIS — G8929 Other chronic pain: Secondary | ICD-10-CM | POA: Diagnosis not present

## 2022-02-26 DIAGNOSIS — G7080 Lambert-Eaton syndrome, unspecified: Secondary | ICD-10-CM | POA: Diagnosis not present

## 2022-03-02 ENCOUNTER — Encounter: Payer: Self-pay | Admitting: Neurology

## 2022-03-02 DIAGNOSIS — E1165 Type 2 diabetes mellitus with hyperglycemia: Secondary | ICD-10-CM | POA: Diagnosis not present

## 2022-03-02 DIAGNOSIS — R591 Generalized enlarged lymph nodes: Secondary | ICD-10-CM | POA: Diagnosis not present

## 2022-03-02 DIAGNOSIS — E890 Postprocedural hypothyroidism: Secondary | ICD-10-CM | POA: Diagnosis not present

## 2022-03-02 DIAGNOSIS — E7800 Pure hypercholesterolemia, unspecified: Secondary | ICD-10-CM | POA: Diagnosis not present

## 2022-03-02 DIAGNOSIS — R682 Dry mouth, unspecified: Secondary | ICD-10-CM | POA: Diagnosis not present

## 2022-03-12 DIAGNOSIS — H35352 Cystoid macular degeneration, left eye: Secondary | ICD-10-CM | POA: Diagnosis not present

## 2022-03-12 DIAGNOSIS — H35412 Lattice degeneration of retina, left eye: Secondary | ICD-10-CM | POA: Diagnosis not present

## 2022-03-12 DIAGNOSIS — H33022 Retinal detachment with multiple breaks, left eye: Secondary | ICD-10-CM | POA: Diagnosis not present

## 2022-03-12 DIAGNOSIS — H33102 Unspecified retinoschisis, left eye: Secondary | ICD-10-CM | POA: Diagnosis not present

## 2022-03-13 HISTORY — PX: OTHER SURGICAL HISTORY: SHX169

## 2022-04-01 ENCOUNTER — Encounter (INDEPENDENT_AMBULATORY_CARE_PROVIDER_SITE_OTHER): Payer: BC Managed Care – PPO | Admitting: Ophthalmology

## 2022-04-01 DIAGNOSIS — Z961 Presence of intraocular lens: Secondary | ICD-10-CM | POA: Diagnosis not present

## 2022-04-01 DIAGNOSIS — H25811 Combined forms of age-related cataract, right eye: Secondary | ICD-10-CM | POA: Diagnosis not present

## 2022-04-02 DIAGNOSIS — H25811 Combined forms of age-related cataract, right eye: Secondary | ICD-10-CM | POA: Diagnosis not present

## 2022-04-02 DIAGNOSIS — Z961 Presence of intraocular lens: Secondary | ICD-10-CM | POA: Diagnosis not present

## 2022-04-07 ENCOUNTER — Other Ambulatory Visit (HOSPITAL_COMMUNITY): Payer: Self-pay | Admitting: Endocrinology

## 2022-04-07 DIAGNOSIS — E890 Postprocedural hypothyroidism: Secondary | ICD-10-CM | POA: Diagnosis not present

## 2022-04-07 DIAGNOSIS — R591 Generalized enlarged lymph nodes: Secondary | ICD-10-CM | POA: Diagnosis not present

## 2022-04-07 DIAGNOSIS — G7000 Myasthenia gravis without (acute) exacerbation: Secondary | ICD-10-CM | POA: Diagnosis not present

## 2022-04-07 DIAGNOSIS — E291 Testicular hypofunction: Secondary | ICD-10-CM | POA: Diagnosis not present

## 2022-04-07 DIAGNOSIS — E1165 Type 2 diabetes mellitus with hyperglycemia: Secondary | ICD-10-CM | POA: Diagnosis not present

## 2022-04-07 DIAGNOSIS — I10 Essential (primary) hypertension: Secondary | ICD-10-CM | POA: Diagnosis not present

## 2022-04-07 DIAGNOSIS — E7800 Pure hypercholesterolemia, unspecified: Secondary | ICD-10-CM | POA: Diagnosis not present

## 2022-04-07 DIAGNOSIS — Z789 Other specified health status: Secondary | ICD-10-CM | POA: Diagnosis not present

## 2022-04-07 DIAGNOSIS — R682 Dry mouth, unspecified: Secondary | ICD-10-CM | POA: Diagnosis not present

## 2022-04-08 DIAGNOSIS — Z961 Presence of intraocular lens: Secondary | ICD-10-CM | POA: Diagnosis not present

## 2022-04-08 DIAGNOSIS — H16041 Marginal corneal ulcer, right eye: Secondary | ICD-10-CM | POA: Diagnosis not present

## 2022-04-14 ENCOUNTER — Ambulatory Visit (HOSPITAL_COMMUNITY)
Admission: RE | Admit: 2022-04-14 | Discharge: 2022-04-14 | Disposition: A | Discharge status: Home / Self Care | Payer: BC Managed Care – PPO | Source: Ambulatory Visit | Attending: Endocrinology | Admitting: Endocrinology

## 2022-04-14 DIAGNOSIS — E1165 Type 2 diabetes mellitus with hyperglycemia: Secondary | ICD-10-CM | POA: Insufficient documentation

## 2022-04-23 ENCOUNTER — Encounter: Payer: Self-pay | Admitting: Cardiovascular Disease

## 2022-04-23 ENCOUNTER — Ambulatory Visit: Payer: BC Managed Care – PPO | Attending: Cardiovascular Disease | Admitting: Cardiovascular Disease

## 2022-04-23 DIAGNOSIS — H35352 Cystoid macular degeneration, left eye: Secondary | ICD-10-CM | POA: Diagnosis not present

## 2022-04-23 DIAGNOSIS — E119 Type 2 diabetes mellitus without complications: Secondary | ICD-10-CM

## 2022-04-23 DIAGNOSIS — R931 Abnormal findings on diagnostic imaging of heart and coronary circulation: Secondary | ICD-10-CM

## 2022-04-23 DIAGNOSIS — I10 Essential (primary) hypertension: Secondary | ICD-10-CM

## 2022-04-23 DIAGNOSIS — G7080 Lambert-Eaton syndrome, unspecified: Secondary | ICD-10-CM

## 2022-04-23 DIAGNOSIS — H35412 Lattice degeneration of retina, left eye: Secondary | ICD-10-CM | POA: Diagnosis not present

## 2022-04-23 DIAGNOSIS — E785 Hyperlipidemia, unspecified: Secondary | ICD-10-CM

## 2022-04-23 DIAGNOSIS — E890 Postprocedural hypothyroidism: Secondary | ICD-10-CM

## 2022-04-23 DIAGNOSIS — E782 Mixed hyperlipidemia: Secondary | ICD-10-CM | POA: Diagnosis not present

## 2022-04-23 DIAGNOSIS — Z794 Long term (current) use of insulin: Secondary | ICD-10-CM

## 2022-04-23 DIAGNOSIS — H33022 Retinal detachment with multiple breaks, left eye: Secondary | ICD-10-CM | POA: Diagnosis not present

## 2022-04-23 DIAGNOSIS — Z7689 Persons encountering health services in other specified circumstances: Secondary | ICD-10-CM

## 2022-04-23 DIAGNOSIS — Z01812 Encounter for preprocedural laboratory examination: Secondary | ICD-10-CM

## 2022-04-23 DIAGNOSIS — Z79899 Other long term (current) drug therapy: Secondary | ICD-10-CM

## 2022-04-23 DIAGNOSIS — I2510 Atherosclerotic heart disease of native coronary artery without angina pectoris: Secondary | ICD-10-CM

## 2022-04-23 MED ORDER — METOPROLOL TARTRATE 25 MG PO TABS: ORAL_TABLET | ORAL | 0 refills | Status: DC

## 2022-04-23 NOTE — Patient Instructions (Addendum)
Medication Instructions:  Your physician recommends that you continue on your current medications as directed. Please refer to the Current Medication list given to you today.  *If you need a refill on your cardiac medications before your next appointment, please call your pharmacy*   Lab Work: Your physician recommends that you return at your earliest convenience to have the following labs drawn: Lipids, CMET and LP(a)  Your physician recommends that you return 1 week prior to your CTA to have the following lab drawn: BMET  If you have labs (blood work) drawn today and your tests are completely normal, you will receive your results only by: [facility] (if you have MyChart) OR A paper copy in the mail If you have any lab test that is abnormal or we need to change your treatment, we will call you to review the results.   Testing/Procedures: Non-Cardiac CT Angiography (CTA), is a special type of CT scan that uses a computer to produce multi-dimensional views of major blood vessels throughout the body. In CT angiography, a contrast material is injected through an IV to help visualize the blood vessels    Follow-Up: At [facility], you and your health needs are our priority.  As part of our continuing mission to provide you with exceptional heart care, we have created designated Provider Care Teams.  These Care Teams include your primary Cardiologist (physician) and Advanced Practice Providers (APPs -  Physician Assistants and Nurse Practitioners) who all work together to provide you with the care you need, when you need it.  We recommend signing up for the patient portal called "MyChart".  Sign up information is provided on this After Visit Summary.  MyChart is used to connect with patients for Virtual Visits (Telemedicine).  Patients are able to view lab/test results, encounter notes, upcoming appointments, etc.  Non-urgent messages can be sent to your provider as well.   To learn  more about what you can do with MyChart, go to [email or web address].    Your next appointment:   2-3 month(s)  Provider:   Shelva Majestic, MD     Other Instructions   Your cardiac CT will be scheduled at one of the below locations:   [facility] [address] [place], [place] [address] [phone]  If scheduled at [facility], please arrive at the [facility] (Entrance C2) of [facility] 30 minutes prior to test start time. You can use the FREE valet parking offered at entrance C (encouraged to control the heart rate for the test)  Proceed to the [facility] Radiology Department (first floor) to check-in and test prep.  All radiology patients and guests should use entrance C2 at [facility], accessed from [facility], even though the hospital's physical address listed is [address].    Please follow these instructions carefully (unless otherwise directed):  Hold all erectile dysfunction medications at least 3 days (72 hrs) prior to test. (Ie viagra, cialis, sildenafil, tadalafil, etc) We will administer nitroglycerin during this exam.   On the Night Before the Test: Be sure to Drink plenty of water. Do not consume any caffeinated/decaffeinated beverages or chocolate 12 hours prior to your test. Do not take any antihistamines 12 hours prior to your test.  On the Day of the Test: Drink plenty of water until 1 hour prior to the test. Do not eat any food 1 hour prior to test. You may take your regular medications prior  to the test.  Take metoprolol (Lopressor) '25mg'$  two hours prior to test. HOLD Furosemide/Hydrochlorothiazide morning of the test.       After the Test: Drink plenty of water. After receiving IV contrast, you may experience a mild flushed feeling. This is normal. On occasion, you may experience a mild rash up to 24 hours after the test. This is not dangerous. If this occurs, you  can take Benadryl 25 mg and increase your fluid intake. If you experience trouble breathing, this can be serious. If it is severe call 911 IMMEDIATELY. If it is mild, please call our office. If you take any of these medications: Glipizide/Metformin, Avandament, Glucavance, please do not take 48 hours after completing test unless otherwise instructed.  We will call to schedule your test 2-4 weeks out understanding that some insurance companies will need an authorization prior to the service being performed.   For non-scheduling related questions, please contact the cardiac imaging nurse navigator should you have any questions/concerns: Marchia Bond, Cardiac Imaging Nurse Navigator Gordy Clement, Cardiac Imaging Nurse Navigator  Heart and Vascular Services Direct Office Dial: [phone]   For scheduling needs, including cancellations and rescheduling, please call [place], [phone].

## 2022-04-23 NOTE — Progress Notes (Signed)
Cardiology Office Note    Date:  05/03/2022   ID:  [name], DOB 1961/02/09, MRN [id number]  PCP:  Adaline Sill, NP  Cardiologist:  Shelva Majestic, MD   New cardiology evaluaiton referred by Dr. Suzette Battiest at [facility] with multiple cardiovascular comorbidities and elevated calcium score.   History of Present Illness:  [name] is a 62 y.o. male has a history of type 2 diabetes mellitus, remote hyperthyroidism status radioactive iodine with subsequent postprocedural hypothyroidism, hypertension, mixed hyperlipidemia, Eaton-Lambert syndrome/myasthenia gravis, and has had issues with statin intolerance.  He tells me was first diagnosed with hypertension approximately 20 years ago at which time lipids were also significantly elevated.  He has been under treatment for diabetes mellitus for over 6 years and sees Dr. Michiel Sites at [facility].  Most recently, he has been on a regimen of Zetia 10 mg and has been taking rosuvastatin one half of a 5 mg tablet or 2.5 mg 3 times a week.  Lipid studies on March 02, 2022 showed total cholesterol 242, HDL cholesterol 50, triglycerides 207, LDL cholesterol 154.  Hemoglobin was 15.4.  Creatinine 1.0.  Potassium in September 2023 was 2.1.  TSH is normal at 1.07.  He is diabetic on insulin and currently is on levothyroxine 137 mcg for hypothyroidism.  He has been on lisinopril most recently increased to 10 mg daily for blood pressure control.  He takes Cymbalta for anxiety depression.  He has a remote history of vitreous hemorrhage of his left eye as well as partial retinal detachment.  Remotely, there was concern for questionable prolonged QT interval and 11 years ago in 2013 he was evaluated by Dr. Dola Argyle.  An echo Doppler study in December 2012 showed normal LV function without significant valvular abnormalities although at the time he did have a questionable cardiac murmur.  On April 14, 2012 he underwent a calcium score  ordered by Dr. Michiel Sites which was elevated at 777 Agatston units, representing 96 percentile with LAD calcification at 160, left circumflex at 338, and RCA at 279.  He denies any recent chest pain.  He exercises only intermittently.  He denies any presyncope or syncope.  He now presents for cardiology evaluation.    Past Medical History:  Diagnosis Date   Anomalous coronary artery origin    The patient underwent cardiac catheterization in 2004.  Coronaries revealed no stenosis.  However he had aberrant takeoff of both the right and left coronaries from the anterior aortic cusp.  There was no evidence of renal artery stenosis   Arthritis    Back pain    Broken fibula    right   Depression    Diabetes mellitus without complication ([place])    Dyslipidemia    Dyslipidemia    Ejection fraction    EF 60-65%, echo, March 18, 2011   H/O radioactive iodine thyroid ablation    History of plasmapheresis    Hypertension    Hyperthyroidism    November, 201 to   Lambert-Eaton myasthenic syndrome ([place])    Followed at [facility]. and Dr. Floyde Parkins   [name] [facility])    Murmur    Systolic murmur, December, 2012,No valvular abnormalities, echo, December, 2012   Nuclear sclerotic cataract of left eye 08/06/2021   Obesity    Ocular migraine 11/30/2019   Pre-syncope    February 23, 2011   QT interval    Question prolongation QT interval, October, 2012, review of  EKG dated February 04, 2011, QT interval was not prolonged    Past Surgical History:  Procedure Laterality Date   KNEE SURGERY Right    PORT-A-CATH REMOVAL N/A 11/12/2017   Procedure: REMOVAL PORT-A-CATH;  Surgeon: Clovis Riley, MD;  Location: [place];  Service: General;  Laterality: N/A;   PORTACATH PLACEMENT     TONSILLECTOMY      Current Medications: Outpatient Medications Prior to Visit  Medication Sig Dispense Refill   ALPRAZolam (XANAX) 0.5 MG tablet TAKE (1) TABLET THREE TIMES DAILY AS NEEDED. 90 tablet 3   BAQSIMI  TWO PACK 3 MG/DOSE POWD Place 3 mg into the nose See admin instructions.     Coenzyme Q10 (CO Q-10) 200 MG CAPS Take 200 mg by mouth daily.     DULoxetine (CYMBALTA) 30 MG capsule Take 1 capsule (30 mg total) by mouth daily. 90 capsule 3   DULoxetine (CYMBALTA) 60 MG capsule Take 1 capsule (60 mg total) by mouth daily. 90 capsule 3   ezetimibe (ZETIA) 10 MG tablet Take 10 mg by mouth daily.     FIRDAPSE 10 MG TABS Take 80 mg by mouth daily.     HYDROcodone-acetaminophen (NORCO) 10-325 MG tablet Take 1 tablet by mouth every 4 (four) hours as needed for moderate pain or severe pain.     LEVEMIR FLEXTOUCH 100 UNIT/ML FlexPen 10 Units daily.     levothyroxine (SYNTHROID) 137 MCG tablet Take 137 mcg by mouth daily.     lisinopril (PRINIVIL,ZESTRIL) 5 MG tablet TAKE 1 TABLET (5 MG TOTAL) BY MOUTH DAILY. (Patient taking differently: Take 10 mg by mouth daily.) 30 tablet 0   Multiple Vitamin (MULTIVITAMIN WITH MINERALS) TABS tablet Take 1 tablet by mouth daily. One-A-Day     Multiple Vitamin (MULTIVITAMIN) tablet Take 1 tablet by mouth daily.     NOVOLOG FLEXPEN 100 UNIT/ML FlexPen Inject into the skin. Sliding scale     ofloxacin (OCUFLOX) 0.3 % ophthalmic solution Place 1 drop into the left eye 4 (four) times daily.     Omega-3 Fatty Acids (FISH OIL) 1200 MG CAPS Take 1,200 mg by mouth 3 (three) times daily.     prednisoLONE acetate (PRED FORTE) 1 % ophthalmic suspension Place 1 drop into the left eye 4 (four) times daily.     predniSONE (DELTASONE) 20 MG tablet Take 40 mg by mouth daily with breakfast.     pyridostigmine (MESTINON) 60 MG tablet Take 1 tablet (60 mg total) by mouth 3 (three) times daily. 270 tablet 3   riTUXimab (RITUXAN) 100 MG/10ML injection Inject into the vein.     rosuvastatin (CRESTOR) 5 MG tablet Take 5 mg by mouth daily.     silver sulfADIAZINE (SILVADENE) 1 % cream Apply 1 application topically daily. 50 g 0   tobramycin (TOBREX) 0.3 % ophthalmic ointment Place 1 Application  into the left eye at bedtime.     valACYclovir (VALTREX) 1000 MG tablet Take 1 tablet (1,000 mg total) by mouth 2 (two) times daily. 60 tablet 0   zolpidem (AMBIEN) 10 MG tablet TAKE 1 TABLET AT BEDTIME AS NEEDED FOR SLEEP 30 tablet 2   No facility-administered medications prior to visit.     Allergies:   Statins, Aminoglycosides, Botulinum toxins, Calcium channel blockers, Macrolides and ketolides, Quinine derivatives, Vioxx [rofecoxib], Ofloxacin, and Toradol [ketorolac tromethamine]   Social History   Socioeconomic History   Marital status: Married    Spouse name: Caren Griffins   Number of children: 2   Years  of education: 12   Highest education level: Not on file  Occupational History   Occupation: police dispatcher    Employer: [employer]  Tobacco Use   Smoking status: Never   Smokeless tobacco: Never  Vaping Use   Vaping Use: Never used  Substance and Sexual Activity   Alcohol use: Not Currently    Alcohol/week: 0.0 standard drinks of alcohol   Drug use: No   Sexual activity: Yes  Other Topics Concern   Not on file  Social History Narrative   Patient lives at home with his wife Caren Griffins).   Disabled.   Education high school.   Right handed.   Caffeine None .   Social Determinants of Health   Financial Resource Strain: Not on file  Food Insecurity: Not on file  Transportation Needs: Not on file  Physical Activity: Not on file  Stress: Not on file  Social Connections: Not on file    Social history is notable that he was born in [place].  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  He does not routinely exercise.  There is no tobacco history.  He is married for 83 years and has 2 children ages 83 and 16 prolonged grandchild.  Family History:  The patient's family history includes Autoimmune disease in his sister; Cancer in an other family member; Cirrhosis in his father and maternal grandmother; Fibromyalgia in his mother; Healthy  in his son and son; Heart disease in an other family member; Hypertension in his brother and mother; Stroke in an other family member.  Mother is living at age 22.  Father died with cirrhosis at age 3.  He has a brother age 24 with hypertension.  He has a sister age 31 who is blind in 1 eye.  ROS General: Negative; No fevers, chills, or night sweats;  HEENT: History of 4 eye surgeries for detached retina with Dr. Zadie Rhine. Pulmonary: Negative; No cough, wheezing, shortness of breath, hemoptysis Cardiovascular: Negative; No chest pain, presyncope, syncope, palpitations GI: Negative; No nausea, vomiting, diarrhea, or abdominal pain GU: Negative; No dysuria, hematuria, or difficulty voiding Musculoskeletal: Show dreams syndrome Hematologic/Oncology: Negative; no easy bruising, bleeding Endocrine: History of hyperthyroidism status post radioactive iodine with postprocedure hypothyroidism is him; insulin-dependent diabetes mellitus. Neuro: Rita Ohara syndrome/myasthenia gravis Skin: Negative; No rashes or skin lesions Psychiatric: Negative; No behavioral problems, depression Sleep: Negative; No snoring, daytime sleepiness, hypersomnolence, bruxism, restless legs, hypnogognic hallucinations, no cataplexy  An Epworth Sleepiness Scale score was calculated in the office today and this endorsed at 4 arguing against excessive daytime sleepiness.  Other comprehensive 14 point system review is negative.   PHYSICAL EXAM:   VS:  BP 122/78 (BP Location: Left Arm, Patient Position: Sitting, Cuff Size: Large)   Ht '5\' 9"'$  (1.753 m)   Wt 215 lb 3.2 oz (97.6 kg)   SpO2 97%   BMI 31.78 kg/m     Repeat blood pressure by me was 124/74  Wt Readings from Last 3 Encounters:  04/23/22 215 lb 3.2 oz (97.6 kg)  01/08/22 216 lb 8 oz (98.2 kg)  07/03/21 222 lb (100.7 kg)    General: Alert, oriented, no distress.  Skin: normal turgor, no rashes, warm and dry HEENT: Normocephalic, atraumatic. Pupils equal  round and reactive to light; sclera anicteric; extraocular muscles intact;  Nose without nasal septal hypertrophy Mouth/Parynx benign; Mallinpatti scale 3 Neck: No JVD, no carotid bruits; normal carotid upstroke Lungs: clear to ausculatation and percussion; no wheezing  or rales Chest wall: without tenderness to palpitation Heart: PMI not displaced, RRR, s1 s2 normal, 1/6 systolic murmur, no diastolic murmur, no rubs, gallops, thrills, or heaves Abdomen: soft, nontender; no hepatosplenomehaly, BS+; abdominal aorta nontender and not dilated by palpation. Back: no CVA tenderness Pulses 2+ Musculoskeletal: full range of motion, normal strength, no joint deformities Extremities: no clubbing cyanosis or edema, Homan's sign negative  Neurologic: grossly nonfocal; Cranial nerves grossly wnl Psychologic: Normal mood and affect   Studies/Labs Reviewed:   ECG (independently read by me):  Sinus bradycardia at 58, no ST changes, no ectopy, QTc 378 msec  Recent Labs:    Latest Ref Rng & Units 04/27/2022    8:39 AM 01/08/2022    2:29 PM 07/03/2021    2:59 PM  BMP  Glucose 70 - 99 mg/dL 96  67  96   BUN 6 - 24 mg/dL '15  14  12   '$ Creatinine 0.76 - 1.27 mg/dL 0.94  0.95  0.91   BUN/Creat Ratio 9 - '20 16  15  13   '$ Sodium 134 - 144 mmol/L 141  141  143   Potassium 3.5 - 5.2 mmol/L 4.5  4.1  4.7   Chloride 96 - 106 mmol/L 103  100  100   CO2 20 - 29 mmol/L '25  24  27   '$ Calcium 8.7 - 10.2 mg/dL 9.4  9.6  9.7         Latest Ref Rng & Units 04/27/2022    8:39 AM 01/08/2022    2:29 PM 07/03/2021    2:59 PM  Hepatic Function  Total Protein 6.0 - 8.5 g/dL 6.6  6.7  6.7   Albumin 3.8 - 4.9 g/dL 4.6  4.8  4.7   AST 0 - 40 IU/L 22  32  26   ALT 0 - 44 IU/L 21  37  31   Alk Phosphatase 44 - 121 IU/L 69  73  90   Total Bilirubin 0.0 - 1.2 mg/dL 0.4  0.3  0.2        Latest Ref Rng & Units 01/08/2022    2:29 PM 07/03/2021    2:59 PM 10/08/2020   10:21 AM  CBC  WBC 3.4 - 10.8 x10E3/uL 7.3  6.8  9.5    Hemoglobin 13.0 - 17.7 g/dL 15.4  15.6  15.4   Hematocrit 37.5 - 51.0 % 44.1  45.6  44.1   Platelets 150 - 450 x10E3/uL 179  152  144    Lab Results  Component Value Date   MCV 90 01/08/2022   MCV 90 07/03/2021   MCV 92 10/08/2020   Lab Results  Component Value Date   TSH 0.158 (L) 01/08/2022   Lab Results  Component Value Date   HGBA1C 6.6 (H) 07/03/2021     BNP No results found for: "BNP"  ProBNP No results found for: "PROBNP"   Lipid Panel     Component Value Date/Time   CHOL 152 04/27/2022 0839   TRIG 101 04/27/2022 0839   HDL 57 04/27/2022 0839   CHOLHDL 2.7 04/27/2022 0839   LDLCALC 77 04/27/2022 0839   LABVLDL 18 04/27/2022 0839     RADIOLOGY: CT CORONARY FRACTIONAL FLOW RESERVE FLUID ANALYSIS  Result Date: 05/01/2022 EXAM: CT FFR ANALYSIS CLINICAL DATA:  chest pain FINDINGS: FFRct analysis was performed on the original cardiac CT angiogram dataset. Diagrammatic representation of the FFRct analysis is provided in a separate PDF document in PACS. This dictation was  created using the PDF document and an interactive 3D model of the results. 3D model is not available in the EMR/PACS. Normal FFR range is >0.80. 1. Left Main:  No significant stenosis. FFR = 1.00 2. LAD: No significant stenosis. Proximal FFR = 0.94, Mid FFR = 0.89, Distal FFR = 0.83 3. LCX: Significant stenosis. Proximal FFR = 0.87, Distal FFR = 0.68 4. RCA: No significant stenosis. Proximal FFR = 0.93, Mid FFR = 0.88, Distal FFR = 0.84, Distal acute marginal = 0.78 IMPRESSION: 1. CT FFR demonstrated possibly significant stenosis of the distal LCX, however, this area was subject to motion artifact. Flow appears to drop discretely to 0.79 (borderline significant) after the proximal stenosis. 2. Clinical correlation is advised. Cardiac catheterization may be indicated. Electronically Signed   By: Pixie Casino M.D.   On: 05/01/2022 16:49   CT CORONARY MORPH W/CTA COR W/SCORE W/CA W/CM &/OR  WO/CM  Addendum Date: 05/01/2022   ADDENDUM REPORT: 05/01/2022 16:15 ADDENDUM: CT images were re-processed and submitted and were able to be sent for FFR. Electronically Signed   By: Pixie Casino M.D.   On: 05/01/2022 16:15   Addendum Date: 05/01/2022   ADDENDUM REPORT: 05/01/2022 13:38 EXAM: OVER-READ INTERPRETATION  CT CHEST The following report is an over-read performed by radiologist Dr. Maudry Diego [facility] on 05/01/2022. This over-read does not include interpretation of cardiac or coronary anatomy or pathology. The coronary CTA interpretation by the cardiologist is attached. COMPARISON:  None. FINDINGS: Vascular: Normal heart size. No pericardial effusion. Normal caliber thoracic aorta with no atherosclerotic disease. Mediastinum/Nodes: Esophagus is unremarkable. No pathologically enlarged lymph nodes seen in the chest. Lungs/Pleura: Central airways are patent. No consolidation, pleural effusion or pneumothorax. Upper Abdomen: No acute abnormality. Musculoskeletal: No chest wall mass or suspicious bone lesions identified. IMPRESSION: The head of the no acute extracardiac findings. Electronically Signed   By: Yetta Glassman M.D.   On: 05/01/2022 13:38   Result Date: 05/01/2022 HISTORY: 62 yo male with elevated coronary calcium score EXAM: Cardiac/Coronary CTA TECHNIQUE: The patient was scanned on a [employer]. PROTOCOL: A 120 kV prospective scan was triggered in the descending thoracic aorta at 111 HU's. Axial non-contrast 3 mm slices were carried out through the heart. The data set was analyzed on a dedicated work station and scored using the [place]. Gantry rotation speed was 250 msecs and collimation was .6 mm. Beta blockade and 0.8 mg of sl NTG was given. The 3D data set was reconstructed in 5% intervals of the 35-75 % of the R-R cycle. Diastolic phases were analyzed on a dedicated work station using MPR, MIP and VRT modes. The patient received 115m  OMNIPAQUE IOHEXOL 350 MG/ML SOLN of contrast. FINDINGS: Quality: Fair, attenuation artifact, motion artifact, HR 47 Coronary calcium score: The patient's coronary artery calcium score is 895, which places the patient in the 96th percentile. Coronary arteries: Anomalous right coronary with common left and right coronary ostia arising from the left coronary cusp. The right coronary takes a normal course and does not appear "compressed" by the PA. Right dominance. Right Coronary Artery: Dominant. Mild 25-49% ostial non-calcified stenosis. There is otherwise minimal 1-24% mixed proximal stenosis and minimal to mild 25-49% mixed distal stenosis. There is minimal 1-24% mixed stenosis of the ostial R-PDA. Left Main Coronary Artery: Minimal non-calcified stenosis of the distal LM 1-24%. Bifurcates into the LAD and LCx arteries. Left Anterior Descending Coronary Artery: Large anterior artery that wraps around the apex. There is  minimal to mild mixed 25-49% proximal stenosis with low attenuation plaque. D1 branch has mild to moderate mixed stenosis 50-69% of the mid-vessel. The D2 branch bifurcates and has mild 25-49% proximal stenosis with low attenuation plaque. Left Circumflex Artery: Subject to motion artifact. AV groove vessel - several small high OM branches. The proximal LCX into the OM2 branch is calcified with mild to moderate stenosis. OM3 branch appears to have severe 70-99% ostial non-calcified stenosis. The mid to distal LCx proper has mild to moderate non-calcified stenoses. Aorta: Normal size, 33 mm at the mid ascending aorta (level of the PA bifurcation) measured double oblique. Aortic atherosclerosis. No dissection. Aortic Valve: Trileaflet. No calcifications. Other findings: Normal pulmonary vein drainage into the left atrium. Normal left atrial appendage without a thrombus. Normal size of the pulmonary artery. IMPRESSION: 1. At least moderate mixed multivessel CAD, CADRADS = 3/4. CT FFR could not be  performed due to motion artifact. 2. Coronary calcium score of 895. This was 96th percentile for age and sex matched control. 3. Anomalous coronary with common left and right coronary ostia arising from the left coronary cusp. The right coronary artery does not appear "compressed" by the PA. Right dominance. 4. Aortic atherosclerosis. 5. Consider further symptom-guided ischemia evaluation such as cardiac catheterization if clinically warranted. Electronically Signed: By: Pixie Casino M.D. On: 05/01/2022 13:19   CT CARDIAC SCORING (SELF PAY ONLY)  Addendum Date: 04/15/2022   ADDENDUM REPORT: 04/15/2022 11:05 EXAM: OVER-READ INTERPRETATION  CT CHEST The following report is an over-read performed by radiologist Dr. Fonnie [facility] on 04/15/2022. This over-read does not include interpretation of cardiac or coronary anatomy or pathology. The interpretation by the cardiologist is attached. COMPARISON:  CT chest 06/27/2005. FINDINGS: Scout view is unremarkable.  No acute extracardiac findings. IMPRESSION: No acute extracardiac findings. Electronically Signed   By: Lorin Picket M.D.   On: 04/15/2022 11:05   Result Date: 04/15/2022 CLINICAL DATA:  Cardiovascular Disease Risk stratification EXAM: Coronary Calcium Score TECHNIQUE: A gated, non-contrast computed tomography scan of the heart was performed using 19m slice thickness. Axial images were analyzed on a dedicated workstation. Calcium scoring of the coronary arteries was performed using the Agatston method. FINDINGS: Coronary arteries: Normal origins. Coronary Calcium Score: Left main: 0 Left anterior descending artery: 160 Left circumflex artery: 338 Right coronary artery: 279 Total: 777 Percentile: 96th Pericardium: Normal. Aorta: Normal caliber of ascending aorta. No aortic atherosclerosis noted. Non-cardiac: [facility] IMPRESSION: Coronary calcium score of 777. This was 96th percentile for age-,  race-, and sex-matched controls. RECOMMENDATIONS: Coronary artery calcium (CAC) score is a strong predictor of incident coronary heart disease (CHD) and provides predictive information beyond traditional risk factors. CAC scoring is reasonable to use in the decision to withhold, postpone, or initiate statin therapy in intermediate-risk or selected borderline-risk asymptomatic adults (age 60-75years and LDL-C >=70 to <190 mg/dL) who do not have diabetes or established atherosclerotic cardiovascular disease (ASCVD).* In intermediate-risk (10-year ASCVD risk >=7.5% to <20%) adults or selected borderline-risk (10-year ASCVD risk >=5% to <7.5%) adults in whom a CAC score is measured for the purpose of making a treatment decision the following recommendations have been made: If CAC=0, it is reasonable to withhold statin therapy and reassess in 5 to 10 years, as long as higher risk conditions are absent (diabetes mellitus, family history of premature CHD in first degree relatives (males <55 years; females <65 years), cigarette smoking, or LDL >=190 mg/dL). If CAC is  1 to 99, it is reasonable to initiate statin therapy for patients >=40 years of age. If CAC is >=100 or >=75th percentile, it is reasonable to initiate statin therapy at any age. Cardiology referral should be considered for patients with CAC scores >=400 or >=75th percentile. *2018 AHA/ACC/AACVPR/AAPA/ABC/ACPM/ADA/AGS/APhA/ASPC/NLA/PCNA Guideline on the Management of Blood Cholesterol: A Report of the American College of Cardiology/American Heart Association Task Force on Clinical Practice Guidelines. J Am Coll Cardiol. 2019;73(24):3168-3209. Buford Dresser, MD Electronically Signed: By: Buford Dresser M.D. On: 04/15/2022 08:53     Additional studies/ records that were reviewed today include:   I reviewed the records of Dr. Michiel Sites at [facility].   ASSESSMENT:    1. Essential hypertension   2. Elevated coronary artery calcium  score: 777 Agatston units   3. Coronary artery disease involving native coronary artery of native heart without angina pectoris   4. Mixed hyperlipidemia   5. Postablative hypothyroidism   6. Type 2 diabetes mellitus without complication, with long-term current use of insulin (HCC)   7. Lambert-Eaton myasthenic syndrome ([place])   8. Encounter for preprocedural laboratory examination   9. Medication management     PLAN:  Mr. 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  He has a longstanding history of hypertension for at least 20 years as well as hyperlipidemia.  He has been felt to be statin intolerant.  Prior lipid studies were consistent with mixed hyperlipidemia with triglycerides as high as 400 in the past.  Most recent lipid studies in November 2023 showed total cholesterol 242, HDL 50, LDL 154 and triglycerides 207 on a regimen of rosuvastatin 2.5 mg 3 times a week as well as Zetia 10 mg daily.  He also has a history of hypertension and has been on lisinopril at 5 mg which recently had been increased to 10 mg daily.  He has had issues with multiple retinal detachments and has undergone 4 surgeries with Dr. Zadie Rhine.  He is also followed at [facility].  He is on Toradol and Rituxan for [name] with possible Sjogren's syndrome.  He does not routinely exercise but denies any definitive substernal chest tightness.  He was recently referred for coronary calcium score which was significantly elevated at 777 agonist and units placing him in the 35 percentile with multivessel coronary calcification in the LAD, circumflex and RCA.  Presently, I have suggested that he try increasing rosuvastatin from 2.5 mg to see if he can tolerate 5 mg 3 days/week with possible increase to daily.  I am scheduling him to undergo coronary CTA for more definitive evaluation to assess for percent luminal obstruction of his multivessel coronary calcification.  I  am also recommending comprehensive laboratory with a c-Met, repeat lipid studies, and also recommended we test for LP(a).  I discussed with him the data regarding LP(a) is an independent risk factor for significant CAD and potential vulnerable plaque.  I suspect he will need future initiation of PCSK9 in addition with Repatha or possible inclisiran depending upon subsequent laboratory studies.  I discussed the importance of proper diet, weight loss, exercise,.  He is followed by Dr. Michiel Sites for his diabetes mellitus and thyroid disease.  He also is on daily prednisone.  I will see him in several months in follow-up of the above studies and further recommendations will be made at that time.   Medication Adjustments/Labs and Tests Ordered: Current medicines are reviewed at length with the patient today.  Concerns regarding medicines are outlined above.  Medication changes, Labs and Tests ordered today are listed in the Patient Instructions below. Patient Instructions  Medication Instructions:  Your physician recommends that you continue on your current medications as directed. Please refer to the Current Medication list given to you today.  *If you need a refill on your cardiac medications before your next appointment, please call your pharmacy*   Lab Work: Your physician recommends that you return at your earliest convenience to have the following labs drawn: Lipids, CMET and LP(a)  Your physician recommends that you return 1 week prior to your CTA to have the following lab drawn: BMET  If you have labs (blood work) drawn today and your tests are completely normal, you will receive your results only by: [place] (if you have MyChart) OR A paper copy in the mail If you have any lab test that is abnormal or we need to change your treatment, we will call you to review the results.   Testing/Procedures: Non-Cardiac CT Angiography (CTA), is a special type of CT scan that uses a computer to  produce multi-dimensional views of major blood vessels throughout the body. In CT angiography, a contrast material is injected through an IV to help visualize the blood vessels    Follow-Up: At [facility], you and your health needs are our priority.  As part of our continuing mission to provide you with exceptional heart care, we have created designated Provider Care Teams.  These Care Teams include your primary Cardiologist (physician) and Advanced Practice Providers (APPs -  Physician Assistants and Nurse Practitioners) who all work together to provide you with the care you need, when you need it.  We recommend signing up for the patient portal called "MyChart".  Sign up information is provided on this After Visit Summary.  MyChart is used to connect with patients for Virtual Visits (Telemedicine).  Patients are able to view lab/test results, encounter notes, upcoming appointments, etc.  Non-urgent messages can be sent to your provider as well.   To learn more about what you can do with MyChart, go to [email or web address].    Your next appointment:   2-3 month(s)  Provider:   Shelva Majestic, MD     Other Instructions   Your cardiac CT will be scheduled at one of the below locations:   [facility] [address] [place], [place] [address] [phone]  If scheduled at [facility], please arrive at the [facility] and Children's Entrance (Entrance C2) of [facility] 30 minutes prior to test start time. You can use the FREE valet parking offered at entrance C (encouraged to control the heart rate for the test)  Proceed to the [facility] Radiology Department (first floor) to check-in and test prep.  All radiology patients and guests should use entrance C2 at [facility], accessed from [facility], even though the hospital's physical address listed is [address].    Please follow these instructions carefully (unless  otherwise directed):  Hold all erectile dysfunction medications at least 3 days (72 hrs) prior to test. (Ie viagra, cialis, sildenafil, tadalafil, etc) We will administer nitroglycerin during this exam.   On the Night Before the Test: Be sure to Drink plenty of water. Do not consume any caffeinated/decaffeinated beverages or chocolate 12 hours prior to your test. Do not take any antihistamines 12 hours prior to your test.  On the Day of the Test: Drink plenty of  water until 1 hour prior to the test. Do not eat any food 1 hour prior to test. You may take your regular medications prior to the test.  Take metoprolol (Lopressor) '25mg'$  two hours prior to test. HOLD Furosemide/Hydrochlorothiazide morning of the test.       After the Test: Drink plenty of water. After receiving IV contrast, you may experience a mild flushed feeling. This is normal. On occasion, you may experience a mild rash up to 24 hours after the test. This is not dangerous. If this occurs, you can take Benadryl 25 mg and increase your fluid intake. If you experience trouble breathing, this can be serious. If it is severe call 911 IMMEDIATELY. If it is mild, please call our office. If you take any of these medications: Glipizide/Metformin, Avandament, Glucavance, please do not take 48 hours after completing test unless otherwise instructed.  We will call to schedule your test 2-4 weeks out understanding that some insurance companies will need an authorization prior to the service being performed.   For non-scheduling related questions, please contact the cardiac imaging nurse navigator should you have any questions/concerns: Marchia Bond, Cardiac Imaging Nurse Navigator Gordy Clement, Cardiac Imaging Nurse Navigator [facility] Direct Office Dial: [phone]   For scheduling needs, including cancellations and rescheduling, please call [place], [phone].   Signed, Shelva Majestic, MD   05/03/2022 12:45 PM    [facility] [address], [place], [place], [place]  [address] Phone: [phone]

## 2022-04-24 ENCOUNTER — Encounter: Payer: Self-pay | Admitting: Cardiovascular Disease

## 2022-04-27 ENCOUNTER — Other Ambulatory Visit: Payer: Self-pay

## 2022-04-27 DIAGNOSIS — Z01812 Encounter for preprocedural laboratory examination: Secondary | ICD-10-CM

## 2022-04-27 DIAGNOSIS — Z79899 Other long term (current) drug therapy: Secondary | ICD-10-CM | POA: Diagnosis not present

## 2022-04-27 DIAGNOSIS — E785 Hyperlipidemia, unspecified: Secondary | ICD-10-CM | POA: Diagnosis not present

## 2022-04-28 LAB — COMPREHENSIVE METABOLIC PANEL
ALT: 21 IU/L (ref 0–44)
AST: 22 IU/L (ref 0–40)
Albumin/Globulin Ratio: 2.3 — ABNORMAL HIGH (ref 1.2–2.2)
Albumin: 4.6 g/dL (ref 3.8–4.9)
Alkaline Phosphatase: 69 IU/L (ref 44–121)
BUN/Creatinine Ratio: 16 (ref 9–20)
BUN: 15 mg/dL (ref 6–24)
Bilirubin Total: 0.4 mg/dL (ref 0.0–1.2)
CO2: 25 mmol/L (ref 20–29)
Calcium: 9.4 mg/dL (ref 8.7–10.2)
Chloride: 103 mmol/L (ref 96–106)
Creatinine, Ser: 0.94 mg/dL (ref 0.76–1.27)
Globulin, Total: 2 g/dL (ref 1.5–4.5)
Glucose: 96 mg/dL (ref 70–99)
Potassium: 4.5 mmol/L (ref 3.5–5.2)
Sodium: 141 mmol/L (ref 134–144)
Total Protein: 6.6 g/dL (ref 6.0–8.5)
eGFR: 93 mL/min/{1.73_m2} (ref >59)

## 2022-04-28 LAB — LIPID PANEL
Chol/HDL Ratio: 2.7 ratio (ref 0.0–5.0)
Cholesterol, Total: 152 mg/dL (ref 100–199)
HDL: 57 mg/dL (ref >39)
LDL Chol Calc (NIH): 77 mg/dL (ref 0–99)
Triglycerides: 101 mg/dL (ref 0–149)
VLDL Cholesterol Cal: 18 mg/dL (ref 5–40)

## 2022-04-28 LAB — LIPOPROTEIN A (LPA): Lipoprotein (a): 315.3 nmol/L — ABNORMAL HIGH (ref <75.0)

## 2022-04-29 ENCOUNTER — Telehealth (HOSPITAL_COMMUNITY): Payer: Self-pay | Admitting: Emergency Medicine

## 2022-04-29 NOTE — Telephone Encounter (Signed)
Reaching out to patient to offer assistance regarding upcoming cardiac imaging study; pt verbalizes understanding of appt date/time, parking situation and where to check in, pre-test NPO status and medications ordered, and verified current allergies; name and call back number provided for further questions should they arise Marchia Bond RN Navigator Cardiac Imaging [facility] [phone] office [phone] cell  Arrival 1100 [address] entrance Denies iv issues '25mg'$  metoprolol tartrate Aware contrast/ nitro

## 2022-05-01 ENCOUNTER — Ambulatory Visit (HOSPITAL_COMMUNITY)
Admission: RE | Admit: 2022-05-01 | Discharge: 2022-05-01 | Disposition: A | Discharge status: Home / Self Care | Payer: Medicare Other | Source: Ambulatory Visit | Attending: Cardiovascular Disease | Admitting: Cardiovascular Disease

## 2022-05-01 ENCOUNTER — Other Ambulatory Visit: Payer: Self-pay | Admitting: Internal Medicine

## 2022-05-01 DIAGNOSIS — I2510 Atherosclerotic heart disease of native coronary artery without angina pectoris: Secondary | ICD-10-CM | POA: Diagnosis not present

## 2022-05-01 DIAGNOSIS — I700 Atherosclerosis of aorta: Secondary | ICD-10-CM | POA: Insufficient documentation

## 2022-05-01 DIAGNOSIS — R931 Abnormal findings on diagnostic imaging of heart and coronary circulation: Secondary | ICD-10-CM | POA: Diagnosis not present

## 2022-05-01 MED ORDER — IOHEXOL 350 MG/ML SOLN
100.0000 mL | Freq: Once | INTRAVENOUS | Status: AC | PRN
  Administered 2022-05-01: 100 mL via INTRAVENOUS

## 2022-05-01 MED ORDER — NITROGLYCERIN 0.4 MG SL SUBL
SUBLINGUAL_TABLET | SUBLINGUAL | Status: AC
  Filled 2022-05-01: qty 2

## 2022-05-01 MED ORDER — NITROGLYCERIN 0.4 MG SL SUBL
0.8000 mg | SUBLINGUAL_TABLET | Freq: Once | SUBLINGUAL | Status: AC
  Administered 2022-05-01: 0.8 mg via SUBLINGUAL

## 2022-05-01 NOTE — Progress Notes (Signed)
Cardiac CT sent for FFR. 

## 2022-05-02 ENCOUNTER — Ambulatory Visit (HOSPITAL_BASED_OUTPATIENT_CLINIC_OR_DEPARTMENT_OTHER)
Admission: RE | Admit: 2022-05-02 | Discharge: 2022-05-02 | Disposition: A | Discharge status: Home / Self Care | Payer: Medicare Other | Source: Ambulatory Visit | Attending: Internal Medicine | Admitting: Internal Medicine

## 2022-05-02 DIAGNOSIS — I700 Atherosclerosis of aorta: Secondary | ICD-10-CM | POA: Diagnosis not present

## 2022-05-02 DIAGNOSIS — I2510 Atherosclerotic heart disease of native coronary artery without angina pectoris: Secondary | ICD-10-CM | POA: Diagnosis not present

## 2022-05-02 DIAGNOSIS — R931 Abnormal findings on diagnostic imaging of heart and coronary circulation: Secondary | ICD-10-CM | POA: Diagnosis not present

## 2022-05-03 ENCOUNTER — Encounter: Payer: Self-pay | Admitting: Cardiovascular Disease

## 2022-05-04 ENCOUNTER — Encounter: Payer: Self-pay | Admitting: Cardiovascular Disease

## 2022-05-04 NOTE — Telephone Encounter (Signed)
See result note-patient aware

## 2022-05-06 DIAGNOSIS — H16041 Marginal corneal ulcer, right eye: Secondary | ICD-10-CM | POA: Diagnosis not present

## 2022-05-06 DIAGNOSIS — Z961 Presence of intraocular lens: Secondary | ICD-10-CM | POA: Diagnosis not present

## 2022-05-19 ENCOUNTER — Encounter: Payer: Self-pay | Admitting: Cardiovascular Disease

## 2022-05-19 ENCOUNTER — Ambulatory Visit: Payer: BC Managed Care – PPO | Attending: Cardiovascular Disease | Admitting: Cardiovascular Disease

## 2022-05-19 DIAGNOSIS — E785 Hyperlipidemia, unspecified: Secondary | ICD-10-CM | POA: Diagnosis not present

## 2022-05-19 DIAGNOSIS — R931 Abnormal findings on diagnostic imaging of heart and coronary circulation: Secondary | ICD-10-CM | POA: Diagnosis not present

## 2022-05-19 DIAGNOSIS — G7080 Lambert-Eaton syndrome, unspecified: Secondary | ICD-10-CM

## 2022-05-19 DIAGNOSIS — Z794 Long term (current) use of insulin: Secondary | ICD-10-CM

## 2022-05-19 DIAGNOSIS — I2510 Atherosclerotic heart disease of native coronary artery without angina pectoris: Secondary | ICD-10-CM

## 2022-05-19 DIAGNOSIS — E890 Postprocedural hypothyroidism: Secondary | ICD-10-CM

## 2022-05-19 DIAGNOSIS — E119 Type 2 diabetes mellitus without complications: Secondary | ICD-10-CM

## 2022-05-19 MED ORDER — METOPROLOL SUCCINATE ER 25 MG PO TB24: 25.0000 mg | ORAL_TABLET | Freq: Every day | ORAL | 3 refills | Status: DC

## 2022-05-19 NOTE — Progress Notes (Unsigned)
Cardiology Office Note    Date:  05/21/2022   ID:  [name], DOB Aug 01, 1960, MRN [id number]  PCP:  Matthew Sill, NP  Cardiologist:  Matthew Majestic, MD   F/U cardiology evaluaiton initially referred by Dr. Michiel Nicholson at [facility] with multiple cardiovascular comorbidities and elevated calcium score.  History of Present Illness:  [name] is a 62 y.o. male has a history of type 2 diabetes mellitus, remote hyperthyroidism status radioactive iodine with subsequent postprocedural hypothyroidism, hypertension, mixed hyperlipidemia, Eaton-Lambert syndrome/myasthenia gravis, and has had issues with statin intolerance.  He tells me was first diagnosed with hypertension approximately 20 years ago at which time lipids were also significantly elevated.  He has been under treatment for diabetes mellitus for over 6 years and sees Dr. Michiel Nicholson at [facility].  Most recently, he has been on a regimen of Zetia 10 mg and has been taking rosuvastatin one half of a 5 mg tablet or 2.5 mg 3 times a week.  Lipid studies on March 02, 2022 showed total cholesterol 242, HDL cholesterol 50, triglycerides 207, LDL cholesterol 154.  Hemoglobin was 15.4.  Creatinine 1.0.  Potassium in September 2023 was 2.1.  TSH is normal at 1.07.  He is diabetic on insulin and currently is on levothyroxine 137 mcg for hypothyroidism.  He has been on lisinopril most recently increased to 10 mg daily for blood pressure control.  He takes Cymbalta for anxiety depression.  He has a remote history of vitreous hemorrhage of his left eye as well as partial retinal detachment.  Remotely, there was concern for questionable prolonged QT interval and 11 years ago in 2013 he was evaluated by Dr. Dola Nicholson.  An echo Doppler study in December 2012 showed normal LV function without significant valvular abnormalities although at the time he did have a questionable cardiac murmur.  On April 14, 2012 he underwent a calcium  score ordered by Dr. Michiel Nicholson which was elevated at 777 Agatston units, representing 96 percentile with LAD calcification at 160, left circumflex at 338, and RCA at 279.  He denies any recent chest pain.  He exercises only intermittently.  He denies any presyncope or syncope.    When I saw him for my initial cardiology evaluation on April 23, 2022 he was completely asymptomatic and denied ever experiencing any chest pain.  In addition he denied any exertional dyspnea or palpitations.  With his abnormal calcium score, I suggested that he try increasing rosuvastatin from 2.5 mg to see if he could tolerate at least 5 mg 3 days/week with possible increasing to daily.  I recommended he undergo coronary CTA for more definitive evaluation of his abnormal calcium score to assess for percent luminal obstruction of his multivessel coronary calcification.  I also recommended follow-up laboratory including an evaluation for LP(a) and I discussed this is an independent risk factor for significant CAD and potential vulnerable plaque.  He underwent his coronary CTA on May 01, 2022.  Calcium score on statin therapy had increased to 895 placing him in the 96 percentile.  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  The right coronary artery took a normal course and did not appear "compressed" by the PA.  He was found to have multivessel disease with RCA stenoses at 25 to 49% ostially and 1 to 24% proximally with 24 to 49% distal stenosis.  Left main he  had 1 to 24% stenosis.  The LAD had 25 to 49% proximal stenosis with moderate 50 to 69% mid stenosis.  The circumflex vessel with subjective motion artifact and had several small high OM branches.  The OM 3 branch which was small had possible stenosis greater than 70%.  FFR analysis was subsequently performed with abnormality in the very distal circumflex.  There was evidence for aortic  atherosclerosis.  Chest CT over read did not show any acute extracardiac findings.  Subsequent laboratory from April 27, 2022 showed total cholesterol 152, triglycerides 101, HDL 57 VLDL 18 and LDL cholesterol had improved to 77.  LP(a) was significantly increased at 315.3.  Presently, [name] is in the office today with his wife.  He states he is completely asymptomatic and does not experience any chest pain or change in exercise tolerance.  He presents for follow-up evaluation.   Past Medical History:  Diagnosis Date   Anomalous coronary artery origin    The patient underwent cardiac catheterization in 2004.  Coronaries revealed no stenosis.  However he had aberrant takeoff of both the right and left coronaries from the anterior aortic cusp.  There was no evidence of renal artery stenosis   Arthritis    Back pain    Broken fibula    right   Depression    Diabetes mellitus without complication ([place])    Dyslipidemia    Dyslipidemia    Ejection fraction    EF 60-65%, echo, March 18, 2011   H/O radioactive iodine thyroid ablation    History of plasmapheresis    Hypertension    Hyperthyroidism    November, 201 to   Lambert-Eaton myasthenic syndrome ([place])    Followed at [facility]. and Dr. Floyde Parkins   [name] [facility])    Murmur    Systolic murmur, December, 2012,No valvular abnormalities, echo, December, 2012   Nuclear sclerotic cataract of left eye 08/06/2021   Obesity    Ocular migraine 11/30/2019   Pre-syncope    February 23, 2011   QT interval    Question prolongation QT interval, October, 2012, review of EKG dated February 04, 2011, QT interval was not prolonged    Past Surgical History:  Procedure Laterality Date   KNEE SURGERY Right    PORT-A-CATH REMOVAL N/A 11/12/2017   Procedure: REMOVAL PORT-A-CATH;  Surgeon: Clovis Riley, MD;  Location: [place];  Service: General;  Laterality: N/A;   PORTACATH PLACEMENT     TONSILLECTOMY      Current  Medications: Outpatient Medications Prior to Visit  Medication Sig Dispense Refill   ALPRAZolam (XANAX) 0.5 MG tablet TAKE (1) TABLET THREE TIMES DAILY AS NEEDED. 90 tablet 3   BAQSIMI TWO PACK 3 MG/DOSE POWD Place 3 mg into the nose See admin instructions.     Coenzyme Q10 (CO Q-10) 200 MG CAPS Take 200 mg by mouth daily.     DULoxetine (CYMBALTA) 30 MG capsule Take 1 capsule (30 mg total) by mouth daily. 90 capsule 3   ezetimibe (ZETIA) 10 MG tablet Take 10 mg by mouth daily.     FIRDAPSE 10 MG TABS Take 80 mg by mouth daily.     HYDROcodone-acetaminophen (NORCO) 10-325 MG tablet Take 1 tablet by mouth every 4 (four) hours as needed for moderate pain or severe pain.     LEVEMIR FLEXTOUCH 100 UNIT/ML FlexPen 10 Units daily.     levothyroxine (SYNTHROID) 137 MCG tablet Take 137 mcg by mouth daily.  lisinopril (PRINIVIL,ZESTRIL) 5 MG tablet TAKE 1 TABLET (5 MG TOTAL) BY MOUTH DAILY. (Patient taking differently: Take 10 mg by mouth daily.) 30 tablet 0   Multiple Vitamin (MULTIVITAMIN) tablet Take 1 tablet by mouth daily.     NOVOLOG FLEXPEN 100 UNIT/ML FlexPen Inject into the skin. Sliding scale     ofloxacin (OCUFLOX) 0.3 % ophthalmic solution Place 1 drop into the left eye 4 (four) times daily.     Omega-3 Fatty Acids (FISH OIL) 1200 MG CAPS Take 1,200 mg by mouth 3 (three) times daily.     prednisoLONE acetate (PRED FORTE) 1 % ophthalmic suspension Place 1 drop into the left eye 4 (four) times daily.     predniSONE (DELTASONE) 20 MG tablet Take 40 mg by mouth daily with breakfast.     pyridostigmine (MESTINON) 60 MG tablet Take 1 tablet (60 mg total) by mouth 3 (three) times daily. 270 tablet 3   riTUXimab (RITUXAN) 100 MG/10ML injection Inject into the vein.     rosuvastatin (CRESTOR) 5 MG tablet Take 5 mg by mouth daily.     silver sulfADIAZINE (SILVADENE) 1 % cream Apply 1 application topically daily. 50 g 0   tobramycin (TOBREX) 0.3 % ophthalmic ointment Place 1 Application into the  left eye at bedtime.     valACYclovir (VALTREX) 1000 MG tablet Take 1 tablet (1,000 mg total) by mouth 2 (two) times daily. 60 tablet 0   zolpidem (AMBIEN) 10 MG tablet TAKE 1 TABLET AT BEDTIME AS NEEDED FOR SLEEP 30 tablet 2   DULoxetine (CYMBALTA) 60 MG capsule Take 1 capsule (60 mg total) by mouth daily. 90 capsule 3   metoprolol tartrate (LOPRESSOR) 25 MG tablet Take 2 hours before CT scan. 1 tablet 0   Multiple Vitamin (MULTIVITAMIN WITH MINERALS) TABS tablet Take 1 tablet by mouth daily. One-A-Day     No facility-administered medications prior to visit.     Allergies:   Statins, Aminoglycosides, Botulinum toxins, Calcium channel blockers, Macrolides and ketolides, Quinine derivatives, Vioxx [rofecoxib], Ofloxacin, and Toradol [ketorolac tromethamine]   Social History   Socioeconomic History   Marital status: Married    Spouse name: Caren Griffins   Number of children: 2   Years of education: 12   Highest education level: Not on file  Occupational History   Occupation: Print production planner: [employer]  Tobacco Use   Smoking status: Never   Smokeless tobacco: Never  Vaping Use   Vaping Use: Never used  Substance and Sexual Activity   Alcohol use: Not Currently    Alcohol/week: 0.0 standard drinks of alcohol   Drug use: No   Sexual activity: Yes  Other Topics Concern   Not on file  Social History Narrative   Patient lives at home with his wife Caren Griffins).   Disabled.   Education high school.   Right handed.   Caffeine None .   Social Determinants of Health   Financial Resource Strain: Not on file  Food Insecurity: Not on file  Transportation Needs: Not on file  Physical Activity: Not on file  Stress: Not on file  Social Connections: Not on file    Social history is notable that he was born in [place].  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  He does not routinely exercise.  There is no tobacco history.  He is married  for 64 years and has 2 children ages 71 and 32 prolonged grandchild.  Family History:  The  patient's family history includes Autoimmune disease in his sister; Cancer in an other family member; Cirrhosis in his father and maternal grandmother; Fibromyalgia in his mother; Healthy in his son and son; Heart disease in an other family member; Hypertension in his brother and mother; Stroke in an other family member.  Mother is living at age 10.  Father died with cirrhosis at age 69.  He has a brother age 80 with hypertension.  He has a sister age 58 who is blind in 1 eye.  ROS General: Negative; No fevers, chills, or night sweats;  HEENT: History of 4 eye surgeries for detached retina with Dr. Zadie Rhine. Pulmonary: Negative; No cough, wheezing, shortness of breath, hemoptysis Cardiovascular: Negative; No chest pain, presyncope, syncope, palpitations GI: Negative; No nausea, vomiting, diarrhea, or abdominal pain GU: Negative; No dysuria, hematuria, or difficulty voiding Musculoskeletal: Show dreams syndrome Hematologic/Oncology: Negative; no easy bruising, bleeding Endocrine: History of hyperthyroidism status post radioactive iodine with postprocedure hypothyroidism is him; insulin-dependent diabetes mellitus. Neuro: Rita Ohara syndrome/myasthenia gravis Skin: Negative; No rashes or skin lesions Psychiatric: Negative; No behavioral problems, depression Sleep: Negative; No snoring, daytime sleepiness, hypersomnolence, bruxism, restless legs, hypnogognic hallucinations, no cataplexy  An Epworth Sleepiness Scale score was calculated in the office  and this endorsed at 4 arguing against excessive daytime sleepiness.  Other comprehensive 14 point system review is negative.   PHYSICAL EXAM:   VS:  BP 122/72   Pulse 74   Ht '5\' 10"'$  (1.778 m)   Wt 210 lb (95.3 kg)   BMI 30.13 kg/m     Repeat blood pressure by me was 124/74  Wt Readings from Last 3 Encounters:  05/19/22 210 lb (95.3 kg)  04/23/22  215 lb 3.2 oz (97.6 kg)  01/08/22 216 lb 8 oz (98.2 kg)    General: Alert, oriented, no distress.  Skin: normal turgor, no rashes, warm and dry HEENT: Normocephalic, atraumatic. Pupils equal round and reactive to light; sclera anicteric; extraocular muscles intact; Fundi ** Nose without nasal septal hypertrophy Mouth/Parynx benign; Mallinpatti scale 3 Neck: No JVD, no carotid bruits; normal carotid upstroke Lungs: clear to ausculatation and percussion; no wheezing or rales Chest wall: without tenderness to palpitation Heart: PMI not displaced, RRR, s1 s2 normal, 1/6 systolic murmur, no diastolic murmur, no rubs, gallops, thrills, or heaves Abdomen: soft, nontender; no hepatosplenomehaly, BS+; abdominal aorta nontender and not dilated by palpation. Back: no CVA tenderness Pulses 2+ Musculoskeletal: full range of motion, normal strength, no joint deformities Extremities: no clubbing cyanosis or edema, Homan's sign negative  Neurologic: grossly nonfocal; Cranial nerves grossly wnl Psychologic: Normal mood and affect   Studies/Labs Reviewed:   May 19, 2022 ECG (independently read by me):  NSR at 74, no ST changes, QTc 388  April 23, 2022 ECG (independently read by me):  Sinus bradycardia at 58, no ST changes, no ectopy, QTc 378 msec  Recent Labs:    Latest Ref Rng & Units 04/27/2022    8:39 AM 01/08/2022    2:29 PM 07/03/2021    2:59 PM  BMP  Glucose 70 - 99 mg/dL 96  67  96   BUN 6 - 24 mg/dL '15  14  12   '$ Creatinine 0.76 - 1.27 mg/dL 0.94  0.95  0.91   BUN/Creat Ratio 9 - '20 16  15  13   '$ Sodium 134 - 144 mmol/L 141  141  143   Potassium 3.5 - 5.2 mmol/L 4.5  4.1  4.7   Chloride 96 -  106 mmol/L 103  100  100   CO2 20 - 29 mmol/L '25  24  27   '$ Calcium 8.7 - 10.2 mg/dL 9.4  9.6  9.7         Latest Ref Rng & Units 04/27/2022    8:39 AM 01/08/2022    2:29 PM 07/03/2021    2:59 PM  Hepatic Function  Total Protein 6.0 - 8.5 g/dL 6.6  6.7  6.7   Albumin 3.8 - 4.9 g/dL 4.6   4.8  4.7   AST 0 - 40 IU/L 22  32  26   ALT 0 - 44 IU/L 21  37  31   Alk Phosphatase 44 - 121 IU/L 69  73  90   Total Bilirubin 0.0 - 1.2 mg/dL 0.4  0.3  0.2        Latest Ref Rng & Units 01/08/2022    2:29 PM 07/03/2021    2:59 PM 10/08/2020   10:21 AM  CBC  WBC 3.4 - 10.8 x10E3/uL 7.3  6.8  9.5   Hemoglobin 13.0 - 17.7 g/dL 15.4  15.6  15.4   Hematocrit 37.5 - 51.0 % 44.1  45.6  44.1   Platelets 150 - 450 x10E3/uL 179  152  144    Lab Results  Component Value Date   MCV 90 01/08/2022   MCV 90 07/03/2021   MCV 92 10/08/2020   Lab Results  Component Value Date   TSH 0.158 (L) 01/08/2022   Lab Results  Component Value Date   HGBA1C 6.6 (H) 07/03/2021     BNP No results found for: "BNP"  ProBNP No results found for: "PROBNP"   Lipid Panel     Component Value Date/Time   CHOL 152 04/27/2022 0839   TRIG 101 04/27/2022 0839   HDL 57 04/27/2022 0839   CHOLHDL 2.7 04/27/2022 0839   LDLCALC 77 04/27/2022 0839   LABVLDL 18 04/27/2022 0839     RADIOLOGY: CT CORONARY FRACTIONAL FLOW RESERVE FLUID ANALYSIS  Result Date: 05/01/2022 EXAM: CT FFR ANALYSIS CLINICAL DATA:  chest pain FINDINGS: FFRct analysis was performed on the original cardiac CT angiogram dataset. Diagrammatic representation of the FFRct analysis is provided in a separate PDF document in PACS. This dictation was created using the PDF document and an interactive 3D model of the results. 3D model is not available in the EMR/PACS. Normal FFR range is >0.80. 1. Left Main:  No significant stenosis. FFR = 1.00 2. LAD: No significant stenosis. Proximal FFR = 0.94, Mid FFR = 0.89, Distal FFR = 0.83 3. LCX: Significant stenosis. Proximal FFR = 0.87, Distal FFR = 0.68 4. RCA: No significant stenosis. Proximal FFR = 0.93, Mid FFR = 0.88, Distal FFR = 0.84, Distal acute marginal = 0.78 IMPRESSION: 1. CT FFR demonstrated possibly significant stenosis of the distal LCX, however, this area was subject to motion artifact. Flow  appears to drop discretely to 0.79 (borderline significant) after the proximal stenosis. 2. Clinical correlation is advised. Cardiac catheterization may be indicated. Electronically Signed   By: Pixie Casino M.D.   On: 05/01/2022 16:49   CT CORONARY MORPH W/CTA COR W/SCORE W/CA W/CM &/OR WO/CM  Addendum Date: 05/01/2022   ADDENDUM REPORT: 05/01/2022 16:15 ADDENDUM: CT images were re-processed and submitted and were able to be sent for FFR. Electronically Signed   By: Pixie Casino M.D.   On: 05/01/2022 16:15   Addendum Date: 05/01/2022   ADDENDUM REPORT: 05/01/2022 13:38 EXAM: OVER-READ INTERPRETATION  CT CHEST The following report is an over-read performed by radiologist Dr. Maudry Diego [facility] on 05/01/2022. This over-read does not include interpretation of cardiac or coronary anatomy or pathology. The coronary CTA interpretation by the cardiologist is attached. COMPARISON:  None. FINDINGS: Vascular: Normal heart size. No pericardial effusion. Normal caliber thoracic aorta with no atherosclerotic disease. Mediastinum/Nodes: Esophagus is unremarkable. No pathologically enlarged lymph nodes seen in the chest. Lungs/Pleura: Central airways are patent. No consolidation, pleural effusion or pneumothorax. Upper Abdomen: No acute abnormality. Musculoskeletal: No chest wall mass or suspicious bone lesions identified. IMPRESSION: The head of the no acute extracardiac findings. Electronically Signed   By: Yetta Glassman M.D.   On: 05/01/2022 13:38   Result Date: 05/01/2022 HISTORY: 62 yo male with elevated coronary calcium score EXAM: Cardiac/Coronary CTA TECHNIQUE: The patient was scanned on a [employer]. PROTOCOL: A 120 kV prospective scan was triggered in the descending thoracic aorta at 111 HU's. Axial non-contrast 3 mm slices were carried out through the heart. The data set was analyzed on a dedicated work station and scored using the [place]. Gantry rotation speed  was 250 msecs and collimation was .6 mm. Beta blockade and 0.8 mg of sl NTG was given. The 3D data set was reconstructed in 5% intervals of the 35-75 % of the R-R cycle. Diastolic phases were analyzed on a dedicated work station using MPR, MIP and VRT modes. The patient received 134m OMNIPAQUE IOHEXOL 350 MG/ML SOLN of contrast. FINDINGS: Quality: Fair, attenuation artifact, motion artifact, HR 47 Coronary calcium score: The patient's coronary artery calcium score is 895, which places the patient in the 96th percentile. Coronary arteries: Anomalous right coronary with common left and right coronary ostia arising from the left coronary cusp. The right coronary takes a normal course and does not appear "compressed" by the PA. Right dominance. Right Coronary Artery: Dominant. Mild 25-49% ostial non-calcified stenosis. There is otherwise minimal 1-24% mixed proximal stenosis and minimal to mild 25-49% mixed distal stenosis. There is minimal 1-24% mixed stenosis of the ostial R-PDA. Left Main Coronary Artery: Minimal non-calcified stenosis of the distal LM 1-24%. Bifurcates into the LAD and LCx arteries. Left Anterior Descending Coronary Artery: Large anterior artery that wraps around the apex. There is minimal to mild mixed 25-49% proximal stenosis with low attenuation plaque. D1 branch has mild to moderate mixed stenosis 50-69% of the mid-vessel. The D2 branch bifurcates and has mild 25-49% proximal stenosis with low attenuation plaque. Left Circumflex Artery: Subject to motion artifact. AV groove vessel - several small high OM branches. The proximal LCX into the OM2 branch is calcified with mild to moderate stenosis. OM3 branch appears to have severe 70-99% ostial non-calcified stenosis. The mid to distal LCx proper has mild to moderate non-calcified stenoses. Aorta: Normal size, 33 mm at the mid ascending aorta (level of the PA bifurcation) measured double oblique. Aortic atherosclerosis. No dissection. Aortic  Valve: Trileaflet. No calcifications. Other findings: Normal pulmonary vein drainage into the left atrium. Normal left atrial appendage without a thrombus. Normal size of the pulmonary artery. IMPRESSION: 1. At least moderate mixed multivessel CAD, CADRADS = 3/4. CT FFR could not be performed due to motion artifact. 2. Coronary calcium score of 895. This was 96th percentile for age and sex matched control. 3. Anomalous coronary with common left and right coronary ostia arising from the left coronary cusp. The right coronary artery does not appear "compressed" by the PA. Right dominance. 4. Aortic atherosclerosis. 5. Consider  further symptom-guided ischemia evaluation such as cardiac catheterization if clinically warranted. Electronically Signed: By: Pixie Casino M.D. On: 05/01/2022 13:19     Additional studies/ records that were reviewed today include:   I reviewed the records of Dr. Michiel Nicholson at [facility].   ASSESSMENT:    1. Coronary artery disease involving native coronary artery of native heart without angina pectoris   2. Hyperlipidemia with target LDL less than 50.   3. Elevated coronary artery calcium score: 777 Agatston units and CTA: 895 Agatston units   4. Postablative hypothyroidism   5. Type 2 diabetes mellitus without complication, with long-term current use of insulin (HCC)   6. Lambert-Eaton myasthenic syndrome [facility])     PLAN:  Mr. 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  He has a longstanding history of hypertension for > 20 years as well as hyperlipidemia.  He has been felt to be statin intolerant.  Prior lipid studies were consistent with mixed hyperlipidemia with triglycerides as high as 400 in the past.  More recent lipid studies in November 2023 showed total cholesterol 242, HDL 50, LDL 154 and triglycerides 207 on a regimen of rosuvastatin 2.5 mg 3 times a week as well as Zetia 10 mg daily.  He also  has a history of hypertension and has been on lisinopril at 5 mg which recently had been increased to 10 mg daily.  He has had issues with multiple retinal detachments and has undergone 4 surgeries with Dr. Zadie Rhine.  He is also followed at [facility]  He is on Toradol and Rituxan for [name] with possible Sjogren's syndrome.  He does not routinely exercise but denies any definitive substernal chest tightness.  He was referred for coronary calcium score which was significantly elevated at 777 Agatston  units placing him in the 96 percentile with multivessel coronary calcification in the LAD, circumflex and RCA.  When I saw him for my initial evaluation I recommended he try increasing rosuvastatin to at least 5 mg 3 days/week with possible increasing to daily as tolerated.  Subsequent lab work on April 27, 2022 had shown improvement in his LDL cholesterol down to 77.  He was found to have significant elevation of LP(a).  I discussed his in detail with both he and his wife today.  I reviewed his coronary CTA.  He again confirms that he is completely asymptomatic.  With his multivessel CAD I have recommended the addition of metoprolol succinate 25 mg for initiation of medical therapy.  He will monitor his blood pressure and if blood pressure becomes low he can decrease lisinopril to 5 mg.  With his inability to take high potency statin and with his markedly elevated LP(a) I have recommended institution of Repatha for PCSK9 inhibition which should be associated with a 26 to potentially 30% reduction in his LP(a).  Will aim for target LDL significantly less than 50 if at all possible and hopefully after 6 months of Repatha we will recheck LP(a) to see if there has been significant reduction.  In 3 months I am rechecking a comprehensive metabolic panel and fasting lipid studies I will see him in 4 months for reevaluation or sooner as needed.  He continues to be followed by Dr. Zachery Conch for his diabetes mellitus  and thyroid disease.   Medication Adjustments/Labs and Tests Ordered: Current medicines are reviewed at length with the patient today.  Concerns regarding medicines are outlined above.  Medication changes,  Labs and Tests ordered today are listed in the Patient Instructions below. Patient Instructions  Medication Instructions:  Will start [place]- our pharmacy team will be in touch.   START Metoprolol Succinate 25 mg daily   (BP monitor at home, you may decrease Lisinopril to 5 mg if needed)   *If you need a refill on your cardiac medications before your next appointment, please call your pharmacy*   Lab Work: CMET, LIPID in 3 months (no lab appointment needed, come fasting- nothing to eat or drink)   If you have labs (blood work) drawn today and your tests are completely normal, you will receive your results only by: [place] (if you have MyChart) OR A paper copy in the mail If you have any lab test that is abnormal or we need to change your treatment, we will call you to review the results.   Follow-Up: At [facility], you and your health needs are our priority.  As part of our continuing mission to provide you with exceptional heart care, we have created designated Provider Care Teams.  These Care Teams include your primary Cardiologist (physician) and Advanced Practice Providers (APPs -  Physician Assistants and Nurse Practitioners) who all work together to provide you with the care you need, when you need it.  We recommend signing up for the patient portal called "MyChart".  Sign up information is provided on this After Visit Summary.  MyChart is used to connect with patients for Virtual Visits (Telemedicine).  Patients are able to view lab/test results, encounter notes, upcoming appointments, etc.  Non-urgent messages can be sent to your provider as well.   To learn more about what you can do with MyChart, go to [email or web address].    Your next appointment:    4 month(s)  Provider:   Shelva Majestic, MD       Signed, Matthew Majestic, MD  05/21/2022 5:17 PM    [facility] [address] [place], [place], [place]  [address] Phone: [phone]   1

## 2022-05-19 NOTE — Patient Instructions (Addendum)
Medication Instructions:  Will start [place]- our pharmacy team will be in touch.   START Metoprolol Succinate 25 mg daily   (BP monitor at home, you may decrease Lisinopril to 5 mg if needed)   *If you need a refill on your cardiac medications before your next appointment, please call your pharmacy*   Lab Work: CMET, LIPID in 3 months (no lab appointment needed, come fasting- nothing to eat or drink)   If you have labs (blood work) drawn today and your tests are completely normal, you will receive your results only by: [place] (if you have MyChart) OR A paper copy in the mail If you have any lab test that is abnormal or we need to change your treatment, we will call you to review the results.   Follow-Up: At [facility], you and your health needs are our priority.  As part of our continuing mission to provide you with exceptional heart care, we have created designated Provider Care Teams.  These Care Teams include your primary Cardiologist (physician) and Advanced Practice Providers (APPs -  Physician Assistants and Nurse Practitioners) who all work together to provide you with the care you need, when you need it.  We recommend signing up for the patient portal called "MyChart".  Sign up information is provided on this After Visit Summary.  MyChart is used to connect with patients for Virtual Visits (Telemedicine).  Patients are able to view lab/test results, encounter notes, upcoming appointments, etc.  Non-urgent messages can be sent to your provider as well.   To learn more about what you can do with MyChart, go to [email or web address].    Your next appointment:   4 month(s)  Provider:   Shelva Majestic, MD

## 2022-05-21 ENCOUNTER — Encounter: Payer: Self-pay | Admitting: Cardiovascular Disease

## 2022-05-22 NOTE — Addendum Note (Signed)
Addended by: Patria Mane A on: 05/22/2022 08:13 AM   Modules accepted: Orders

## 2022-05-26 DIAGNOSIS — B029 Zoster without complications: Secondary | ICD-10-CM | POA: Diagnosis not present

## 2022-05-26 DIAGNOSIS — I10 Essential (primary) hypertension: Secondary | ICD-10-CM | POA: Diagnosis not present

## 2022-05-26 DIAGNOSIS — E785 Hyperlipidemia, unspecified: Secondary | ICD-10-CM | POA: Diagnosis not present

## 2022-05-26 DIAGNOSIS — G8929 Other chronic pain: Secondary | ICD-10-CM | POA: Diagnosis not present

## 2022-05-28 DIAGNOSIS — H35352 Cystoid macular degeneration, left eye: Secondary | ICD-10-CM | POA: Diagnosis not present

## 2022-05-28 DIAGNOSIS — H35412 Lattice degeneration of retina, left eye: Secondary | ICD-10-CM | POA: Diagnosis not present

## 2022-05-28 DIAGNOSIS — H33022 Retinal detachment with multiple breaks, left eye: Secondary | ICD-10-CM | POA: Diagnosis not present

## 2022-06-11 ENCOUNTER — Other Ambulatory Visit: Payer: Self-pay | Admitting: Neurology

## 2022-06-16 NOTE — Progress Notes (Unsigned)
Patient ID: [name]                 DOB: 1961/04/08                    MRN: [id number]      HPI: [name] is a 62 y.o. male patient referred to lipid clinic by Dr. Gurney Maxin. PMH is significant for  CAD ( CAC score 777), T2DM, hypertension, HDL, statin intolerance, Eaton-Lambert syndrome/myasthenia gravis.   Reviewed options for lowering LDL cholesterol, including ezetimibe, PCSK-9 inhibitors, bempedoic acid and inclisiran.  Discussed mechanisms of action, dosing, side effects and potential decreases in LDL cholesterol.  Also reviewed cost information and potential options for patient assistance.  Current Medications: Crestor 5 mg daily, ezetimibe 10 mg daily Intolerances: Crestor - muscle weakness - myalgia  Risk Factors: CAD, T2DM,  LDL goal:  Last lipid lab March 02, 2022 total cholesterol 242, HDL cholesterol 50, triglycerides 207, LDL cholesterol 154 Diet:   Exercise:   Family History:   Social History:   Labs: Lipid Panel     Component Value Date/Time   CHOL 152 04/27/2022 0839   TRIG 101 04/27/2022 0839   HDL 57 04/27/2022 0839   CHOLHDL 2.7 04/27/2022 0839   LDLCALC 77 04/27/2022 0839   LABVLDL 18 04/27/2022 0839    Past Medical History:  Diagnosis Date   Anomalous coronary artery origin    The patient underwent cardiac catheterization in 2004.  Coronaries revealed no stenosis.  However he had aberrant takeoff of both the right and left coronaries from the anterior aortic cusp.  There was no evidence of renal artery stenosis   Arthritis    Back pain    Broken fibula    right   Depression    Diabetes mellitus without complication (HCC)    Dyslipidemia    Dyslipidemia    Ejection fraction    EF 60-65%, echo, March 18, 2011   H/O radioactive iodine thyroid ablation    History of plasmapheresis    Hypertension    Hyperthyroidism    November, 201 to   Lambert-Eaton myasthenic syndrome (HCC)    Followed at [facility]. and Dr. Lesia Sago    [name] [facility])    Murmur    Systolic murmur, December, 2012,No valvular abnormalities, echo, December, 2012   Nuclear sclerotic cataract of left eye 08/06/2021   Obesity    Ocular migraine 11/30/2019   Pre-syncope    February 23, 2011   QT interval    Question prolongation QT interval, October, 2012, review of EKG dated February 04, 2011, QT interval was not prolonged    Current Outpatient Medications on File Prior to Visit  Medication Sig Dispense Refill   ALPRAZolam (XANAX) 0.5 MG tablet TAKE (1) TABLET THREE TIMES DAILY AS NEEDED. 90 tablet 3   BAQSIMI TWO PACK 3 MG/DOSE POWD Place 3 mg into the nose See admin instructions.     Coenzyme Q10 (CO Q-10) 200 MG CAPS Take 200 mg by mouth daily.     DULoxetine (CYMBALTA) 30 MG capsule Take 1 capsule (30 mg total) by mouth daily. 90 capsule 3   DULoxetine (CYMBALTA) 60 MG capsule TAKE ONE CAPSULE DAILY (WITH 30MG  TOTAL DOSE 90MG /DAY) 90 capsule 0   ezetimibe (ZETIA) 10 MG tablet Take 10 mg by mouth daily.     FIRDAPSE 10 MG TABS Take 80 mg by mouth daily.     HYDROcodone-acetaminophen (NORCO) 10-325 MG tablet Take 1 tablet  by mouth every 4 (four) hours as needed for moderate pain or severe pain.     LEVEMIR FLEXTOUCH 100 UNIT/ML FlexPen 10 Units daily.     levothyroxine (SYNTHROID) 137 MCG tablet Take 137 mcg by mouth daily.     lisinopril (PRINIVIL,ZESTRIL) 5 MG tablet TAKE 1 TABLET (5 MG TOTAL) BY MOUTH DAILY. (Patient taking differently: Take 10 mg by mouth daily.) 30 tablet 0   metoprolol succinate (TOPROL XL) 25 MG 24 hr tablet Take 1 tablet (25 mg total) by mouth daily. 90 tablet 3   Multiple Vitamin (MULTIVITAMIN) tablet Take 1 tablet by mouth daily.     NOVOLOG FLEXPEN 100 UNIT/ML FlexPen Inject into the skin. Sliding scale     ofloxacin (OCUFLOX) 0.3 % ophthalmic solution Place 1 drop into the left eye 4 (four) times daily.     Omega-3 Fatty Acids (FISH OIL) 1200 MG CAPS Take 1,200 mg by mouth 3 (three) times daily.      prednisoLONE acetate (PRED FORTE) 1 % ophthalmic suspension Place 1 drop into the left eye 4 (four) times daily.     predniSONE (DELTASONE) 20 MG tablet Take 40 mg by mouth daily with breakfast.     pyridostigmine (MESTINON) 60 MG tablet Take 1 tablet (60 mg total) by mouth 3 (three) times daily. 270 tablet 3   riTUXimab (RITUXAN) 100 MG/10ML injection Inject into the vein.     rosuvastatin (CRESTOR) 5 MG tablet Take 5 mg by mouth daily.     silver sulfADIAZINE (SILVADENE) 1 % cream Apply 1 application topically daily. 50 g 0   tobramycin (TOBREX) 0.3 % ophthalmic ointment Place 1 Application into the left eye at bedtime.     valACYclovir (VALTREX) 1000 MG tablet Take 1 tablet (1,000 mg total) by mouth 2 (two) times daily. 60 tablet 0   zolpidem (AMBIEN) 10 MG tablet TAKE 1 TABLET AT BEDTIME AS NEEDED FOR SLEEP 30 tablet 2   No current facility-administered medications on file prior to visit.    Allergies  Allergen Reactions   Statins Other (See Comments)    MYALGIAS   Aminoglycosides    Botulinum Toxins    Calcium Channel Blockers    Macrolides And Ketolides    Quinine Derivatives    Vioxx [Rofecoxib]     UNSPECIFIED REACTION    Ofloxacin Itching    Topical ofloxacin eyedrops to the left eye with periocular itching.   Toradol [Ketorolac Tromethamine] Rash    UNSPECIFIED REACTION     Assessment/Plan:  1. Hyperlipidemia -  No problems updated. No problem-specific Assessment & Plan notes found for this encounter.    Thank you,  Carmela Hurt, Pharm.D [facility] A Division of [facility] [address] [place], [place] [address]  Phone: [phone]; Fax: [phone]

## 2022-06-17 ENCOUNTER — Telehealth: Payer: Self-pay

## 2022-06-17 ENCOUNTER — Ambulatory Visit
Payer: BC Managed Care – PPO | Attending: Internal Medicine | Admitting: Pharmacist Clinician (PhC)/ Clinical Pharmacy Specialist

## 2022-06-17 ENCOUNTER — Telehealth: Payer: Self-pay | Admitting: Pharmacist Clinician (PhC)/ Clinical Pharmacy Specialist

## 2022-06-17 ENCOUNTER — Other Ambulatory Visit (HOSPITAL_COMMUNITY): Payer: Self-pay

## 2022-06-17 DIAGNOSIS — E785 Hyperlipidemia, unspecified: Secondary | ICD-10-CM

## 2022-06-17 NOTE — Assessment & Plan Note (Signed)
Assessment: Patient with ASCVD not at LDL goal of < 55 Most recent LDL 77 on 04/27/2022 Has been compliant with moderate intensity statin/ezetimibe : rosuvastatin 5 mg daily, ezetimibe 10 mg daily Not able to tolerate higher doses secondary to myalgias Reviewed PCSK-9 inhibitors.  Discussed mechanisms of action, dosing, side effects, potential decreases in LDL cholesterol and costs.  Also reviewed potential options for patient assistance.  Plan: Patient agreeable to starting Repatha Repeat labs after:  3 months Lipid Liver function

## 2022-06-17 NOTE — Telephone Encounter (Signed)
Please start PA for Repatha.

## 2022-06-17 NOTE — Telephone Encounter (Signed)
Pharmacy Patient Advocate Encounter   Received notification from [facility] that prior authorization for REPATHA 140 MG/ML INJ is needed.    PA submitted on 06/17/22 Key [id number] Status is pending  Karie Soda, [place] Patient Advocate Specialist Direct Number: [phone] Fax: [phone]

## 2022-06-17 NOTE — Patient Instructions (Addendum)
Your Results:             Your most recent labs Goal  Total Cholesterol 152 < 200  Triglycerides 101 < 150  HDL (happy/good cholesterol) 57 > 40  LDL (lousy/bad cholesterol 77 < 55   Medication changes:  We will start the process to get Repatha covered by your insurance.  Once the prior authorization is complete, I will call/send a MyChart message to let you know and confirm pharmacy information.   You will take one injection every 14 days.  I will reach out to the research team about possible trials for patients with elevated Lp(a).  Lab orders:  We want to repeat labs after 2-3 months.  We will send you a lab order to remind you once we get closer to that time.     Thank you for choosing [facility]

## 2022-06-17 NOTE — Progress Notes (Signed)
Office Visit    Patient Name: [name] Date of Encounter: 06/17/2022  Primary Care Provider:  Adaline Sill, NP Primary Cardiologist:  Shelva Majestic, MD  Chief Complaint    Hyperlipidemia   Significant Past Medical History   CAD CAC score 86 - 96th percentile  DM2 3/23 A1c 6.6 on Levimir, Novolog  htn   hyperthyroid Radioactive iodine - now on levothyroxine 137 mcg  Eaton-Lambert On Firdapase and Rituxan     Allergies  Allergen Reactions   Statins Other (See Comments)    MYALGIAS   Aminoglycosides    Botulinum Toxins    Calcium Channel Blockers    Macrolides And Ketolides    Quinine Derivatives    Vioxx [Rofecoxib]     UNSPECIFIED REACTION    Ofloxacin Itching    Topical ofloxacin eyedrops to the left eye with periocular itching.   Toradol [Ketorolac Tromethamine] Rash    UNSPECIFIED REACTION     History of Present Illness    [name] is a 62 y.o. male patient of Dr Claiborne Billings, in the office today to discuss lipid management.  He is currently tolerating rosuvastatin 5 mg daily as well as ezetimibe 10 mg daily.  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  LDL Cholesterol goal:  LDL < 55  Current Medications:   rosuvastatin 5 mg daily, ezetimibe 10 mg daily  Family Hx:   grandparents all had atherosclerosis, paternal grandfather had MI; father died from cirrhosis, mom living at 96 breast cancer, hypertension; brother with hypertension; children healthy (34, 31)   Social Hx: Tobacco: no Alcohol:   no   Diet:    mix of home and out, not fast food; salads or chicken wraps usually for lunch; home eats lots of chicken  (wife is alpha gal so follows her diet); doesn't snack much; breakfast consistent 3 waffles daily  Exercise: no regular exercise, walks at work daily  Adherence Assessment  Do you ever forget to take your medication? '[]'$ Yes '[x]'$ No  Do you ever skip doses due to side effects? '[]'$ Yes '[x]'$ No  Do you have trouble  affording your medicines? '[x]'$ Yes - Firdapse - gets through [facility] currently '[]'$ No  Are you ever unable to pick up your medication due to transportation difficulties? '[]'$ Yes '[x]'$ No  Do you ever stop taking your medications because you don't believe they are helping? '[]'$ Yes '[x]'$ No    Accessory Clinical Findings   Lp(a) 315.3  Lab Results  Component Value Date   CHOL 152 04/27/2022   HDL 57 04/27/2022   LDLCALC 77 04/27/2022   TRIG 101 04/27/2022   CHOLHDL 2.7 04/27/2022    Lab Results  Component Value Date   ALT 21 04/27/2022   AST 22 04/27/2022   ALKPHOS 69 04/27/2022   BILITOT 0.4 04/27/2022   Lab Results  Component Value Date   CREATININE 0.94 04/27/2022   BUN 15 04/27/2022   NA 141 04/27/2022   K 4.5 04/27/2022   CL 103 04/27/2022   CO2 25 04/27/2022   Lab Results  Component Value Date   HGBA1C 6.6 (H) 07/03/2021    Home Medications    Current Outpatient Medications  Medication Sig Dispense Refill   gabapentin (NEURONTIN) 300 MG capsule Take 600 mg by mouth 2 (two) times daily.     ALPRAZolam (XANAX) 0.5 MG tablet TAKE (1) TABLET THREE TIMES DAILY AS NEEDED. 90 tablet 3   BAQSIMI TWO PACK 3 MG/DOSE POWD Place 3  mg into the nose See admin instructions.     Coenzyme Q10 (CO Q-10) 200 MG CAPS Take 200 mg by mouth daily.     DULoxetine (CYMBALTA) 30 MG capsule Take 1 capsule (30 mg total) by mouth daily. 90 capsule 3   DULoxetine (CYMBALTA) 60 MG capsule TAKE ONE CAPSULE DAILY (WITH '30MG'$  TOTAL DOSE '90MG'$ /DAY) 90 capsule 0   ezetimibe (ZETIA) 10 MG tablet Take 10 mg by mouth daily.     FIRDAPSE 10 MG TABS Take 80 mg by mouth daily.     HYDROcodone-acetaminophen (NORCO) 10-325 MG tablet Take 1 tablet by mouth every 4 (four) hours as needed for moderate pain or severe pain.     LEVEMIR FLEXTOUCH 100 UNIT/ML FlexPen 10 Units daily.     levothyroxine (SYNTHROID) 137 MCG tablet Take 137 mcg by mouth daily.     lisinopril (PRINIVIL,ZESTRIL) 5 MG tablet TAKE 1 TABLET (5 MG TOTAL)  BY MOUTH DAILY. (Patient taking differently: Take 10 mg by mouth daily.) 30 tablet 0   metoprolol succinate (TOPROL XL) 25 MG 24 hr tablet Take 1 tablet (25 mg total) by mouth daily. 90 tablet 3   Multiple Vitamin (MULTIVITAMIN) tablet Take 1 tablet by mouth daily.     NOVOLOG FLEXPEN 100 UNIT/ML FlexPen Inject into the skin. Sliding scale     ofloxacin (OCUFLOX) 0.3 % ophthalmic solution Place 1 drop into the left eye 4 (four) times daily.     Omega-3 Fatty Acids (FISH OIL) 1200 MG CAPS Take 1,200 mg by mouth 3 (three) times daily.     prednisoLONE acetate (PRED FORTE) 1 % ophthalmic suspension Place 1 drop into the left eye 4 (four) times daily.     predniSONE (DELTASONE) 20 MG tablet Take 40 mg by mouth daily with breakfast.     pyridostigmine (MESTINON) 60 MG tablet Take 1 tablet (60 mg total) by mouth 3 (three) times daily. 270 tablet 3   riTUXimab (RITUXAN) 100 MG/10ML injection Inject into the vein.     rosuvastatin (CRESTOR) 5 MG tablet Take 5 mg by mouth daily.     silver sulfADIAZINE (SILVADENE) 1 % cream Apply 1 application topically daily. 50 g 0   tobramycin (TOBREX) 0.3 % ophthalmic ointment Place 1 Application into the left eye at bedtime.     valACYclovir (VALTREX) 1000 MG tablet Take 1 tablet (1,000 mg total) by mouth 2 (two) times daily. 60 tablet 0   zolpidem (AMBIEN) 10 MG tablet TAKE 1 TABLET AT BEDTIME AS NEEDED FOR SLEEP 30 tablet 2   No current facility-administered medications for this visit.     Assessment & Plan    Hyperlipidemia Assessment: Patient with ASCVD not at LDL goal of < 55 Most recent LDL 77 on 04/27/2022 Has been compliant with moderate intensity statin/ezetimibe : rosuvastatin 5 mg daily, ezetimibe 10 mg daily Not able to tolerate higher doses secondary to myalgias Reviewed PCSK-9 inhibitors.  Discussed mechanisms of action, dosing, side effects, potential decreases in LDL cholesterol and costs.  Also reviewed potential options for patient  assistance.  Plan: Patient agreeable to starting Repatha Repeat labs after:  3 months Lipid Liver function   Tommy Medal, PharmD CPP [facility] [address] [place]  [place], [place] [address] [phone]  06/17/2022, 11:33 AM

## 2022-06-19 ENCOUNTER — Other Ambulatory Visit (HOSPITAL_COMMUNITY): Payer: Self-pay

## 2022-06-19 ENCOUNTER — Ambulatory Visit: Payer: BC Managed Care – PPO

## 2022-06-22 ENCOUNTER — Other Ambulatory Visit (HOSPITAL_COMMUNITY): Payer: Self-pay

## 2022-06-24 NOTE — Telephone Encounter (Signed)
Pharmacy Patient Advocate Encounter  Prior Authorization for REPATHA 140 MG/ML INJ  has been approved.    Effective dates: 06/22/22 through 06/22/23  Karie Soda, [place] Patient Advocate Specialist Direct Number: [phone] Fax: [phone]

## 2022-06-25 MED ORDER — REPATHA SURECLICK 140 MG/ML ~~LOC~~ SOAJ: 140.0000 mg | SUBCUTANEOUS | 3 refills | Status: DC

## 2022-06-25 NOTE — Addendum Note (Signed)
Addended by: Rockne Menghini on: 06/25/2022 01:02 PM   Modules accepted: Orders

## 2022-06-25 NOTE — Telephone Encounter (Signed)
Spoke with wife, prescription sent to [facility]

## 2022-07-09 DIAGNOSIS — H35412 Lattice degeneration of retina, left eye: Secondary | ICD-10-CM | POA: Diagnosis not present

## 2022-07-09 DIAGNOSIS — H1010 Acute atopic conjunctivitis, unspecified eye: Secondary | ICD-10-CM | POA: Diagnosis not present

## 2022-07-09 DIAGNOSIS — H35352 Cystoid macular degeneration, left eye: Secondary | ICD-10-CM | POA: Diagnosis not present

## 2022-07-09 DIAGNOSIS — H33102 Unspecified retinoschisis, left eye: Secondary | ICD-10-CM | POA: Diagnosis not present

## 2022-07-11 ENCOUNTER — Other Ambulatory Visit: Payer: Self-pay | Admitting: Neurology

## 2022-07-13 ENCOUNTER — Encounter: Payer: Self-pay | Admitting: Neurology

## 2022-07-13 ENCOUNTER — Ambulatory Visit (INDEPENDENT_AMBULATORY_CARE_PROVIDER_SITE_OTHER): Payer: Medicare Other | Admitting: Neurology

## 2022-07-13 VITALS — BP 137/81 | HR 57 | Ht 70.0 in | Wt 213.0 lb

## 2022-07-13 DIAGNOSIS — R52 Pain, unspecified: Secondary | ICD-10-CM | POA: Diagnosis not present

## 2022-07-13 DIAGNOSIS — F32A Depression, unspecified: Secondary | ICD-10-CM | POA: Diagnosis not present

## 2022-07-13 DIAGNOSIS — G7080 Lambert-Eaton syndrome, unspecified: Secondary | ICD-10-CM

## 2022-07-13 MED ORDER — PYRIDOSTIGMINE BROMIDE 60 MG PO TABS: 60.0000 mg | ORAL_TABLET | Freq: Three times a day (TID) | ORAL | 3 refills | Status: AC

## 2022-07-13 MED ORDER — GABAPENTIN 300 MG PO CAPS: 600.0000 mg | ORAL_CAPSULE | Freq: Two times a day (BID) | ORAL | 3 refills | Status: DC

## 2022-07-13 MED ORDER — DULOXETINE HCL 60 MG PO CPEP: ORAL_CAPSULE | ORAL | 3 refills | Status: DC

## 2022-07-13 NOTE — Patient Instructions (Signed)
Check labs, continue current medications, check with Dr. Nanine Means about resuming Rituxan

## 2022-07-13 NOTE — Progress Notes (Signed)
ASSESSMENT AND PLAN  [name] is a 62 y.o. 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 Queen Slough at [facility], he complains of GI side effect with high-dose, takes 60 mg twice a day  Check labs today, I think he could resume Rituxan, last infusion was in July 2023, it was held based on being immunocompromised from frequent surgeries for complicated retinal detachment, was getting Rituxan every 5 months, he is going to reach out to Dr. Nanine Means to get opinion on resuming, Dr. Bing Plume. Krista Blue in May 2023 both felt okay to get infusion  Depression body achy pain  Improved with higher dose of Cymbalta 90 mg daily, refilled   Refilled gabapentin 600 mg twice daily  At risk for obstructive sleep apnea  Not interested at this point     DIAGNOSTIC DATA (LABS, IMAGING, TESTING) - I reviewed patient records, labs, notes, testing and imaging myself where available.   MEDICAL HISTORY:  [name], is a 62 year old male, follow-up for [name], was diagnosed in 21.  Previously patient of Dr. Jannifer Franklin,  I reviewed and summarized multiple previous notes including [facility] neuromuscular specialist Dr. Deberah Pelton note.  He presented with lower extremity weakness, later progressed to involve his upper extremity, also had ptosis, diplopia, dry eye and dry mouth.  He underwent multiple evaluation for malignancy, all have been negative.  His diagnosis is based on positive P/Q type voltage-gated calcium channel antibodies.  He had extensive treatment history, lack of response to prednisone, Cytoxan, Imuran, CellCept.  He did respond to IVIG, he had been receiving IVIG on  a weekly basis from 1993-2006.  He was started on 3 4 DAP in 2001, result in sustained improvement.  Mestinon was started in 2002, he does complains of GI side effect with higher dose, also have a history of bradycardia.  From 2005, he has been treated with rituximab, initially was receiving 1000 mg IV infusion 2 weeks apart every 6 months.  From December 2022, he complains of returning generalized weakness fatigue at the end of the 4 months.  The interval has been shortened to every 5 months, he tolerated well.  Most recent office visit with Dr. Queen Slough was in November 2022, he did report with each infusion he felt a boost of energy, less dyspnea on exertion, he can walk better, but it does not help his dry eye and dry mouth much.  Since 3,4-DAP is no longer on the market, it was replaced by Firdapse, 10 mg 2 tablets 4 times a day,  He was also seen by [facility] rheumatologist Dr. Flonnie Overman, for positive ANA, double-stranded DNA,  Rituximab infusion through intrafusion on October 14, 2020 500 units, July 26 500 units, December 13 500/December/27 500 units, next infusion will be on May 30/June 13,  Patient reported that when he is taking prednisone along with intra fusion, he does better, but understand the potential long-term side effect with prednisone, he has diabetes, use insulin shots  He complains of frequent painful blister broke out at the perioral area, was treated with Valtrex as needed  In addition, he complains of extreme stress at home, Cymbalta 60 mg  daily has helped his body achy pain and depression, and work part-time job as an Stage manager for [employer], feel fulfilled by helping people and animals.  He still have significant dry mouth, intermittent oblique double vision,  UPDATE Sept 28 2023: Lambert-Eaton symptoms is overall under reasonable control, received rituximab infusion in July 2023, was seen by Dr. Artemio Aly March 04, 2021, continued on Ruzurgi 40 mg  twice a day, Mestinon 60 mg twice a day, he reported the benefit following infusion,  He has terrible dry eye, was seen by [facility]ology at [facility] in the past, consider diagnosis of Sjogren's disease, in the setting of positive ANA,  He was seen by ophthalmologist Dr. Deloria Lair, after diagnosis of left eye vitreous hemorrhage, new partial retinal detachment with multiple defect, now left vision is improved,  His wife noticed since 2023 he has vivid dreams during sleep, excessive movement, snoring, excessive daytime sleepiness, fatigue,  Higher dose of Cymbalta 90 mg daily helped his body achy pain after  Update July 13, 2022 SS: Labs 01/08/22 at last visit with Dr. Krista Blue normal IgG IgA IgM, CBC, CMP, CK, TSH 0.158. Here with his wife. Achy pain to right shoulder blade intermittent, shingles to base of right neck. Pain/sharp, radiates numbness to left thigh up to left hip x several weeks. Unclear cause, out of gabapentin. Detached retina left, several surgeries in the last year, with Dr. Zadie Rhine, vision is impaired, affects gait, balance. Rituxan has been delayed due to the eye issues, last was summer 2023. Firdapse tablets daily. He gets Rituxan at our infusion center. Normally before Rituxan feels weak, tired, gets burst of energy with it. Seeing cardiology for elevated calcium score, on repatha. Currently taking Cymbalta 90 mg daily. Mestinon 60 mg BID, makes Firdapse last longer. Hasn't had time to have sleep study. Working part-time for [employer]. Out of gabapentin.   PHYSICAL EXAM:   Vitals:   07/13/22 1417  BP: 137/81  Pulse: (!) 57   PHYSICAL EXAMNIATION:  Gen: NAD, conversant, well nourised, well groomed                     Cardiovascular: Regular rate rhythm, no peripheral edema, warm, nontender. Eyes: Conjunctivae clear without exudates or hemorrhage Pulmonary: Clear to auscultation bilaterally   NEUROLOGICAL EXAM:  MENTAL STATUS: Speech/cognition: Awake, alert, oriented to  history taking and casual conversation   CRANIAL NERVES: CN II: Visual fields are full to confrontation. Pupils are round equal and briskly reactive to light. CN III, IV, VI: extraocular movement are normal.  Mild left side fatigable ptosis to the left  CN V: Facial sensation is intact to light touch CN VII: Face is symmetric with normal eye closure  CN VIII: Hearing is normal to causal conversation. CN IX, X: Phonation is normal. CN XI: Head turning and shoulder shrug are intact CN XII: Tongue movement is normal  MOTOR: There is no pronator drift of out-stretched arms. Muscle bulk and tone are normal. Muscle strength is normal.  REFLEXES: Reflexes are decreased to absent  SENSORY: Intact to light touch, pinprick and vibratory sensation are intact in fingers and toes.  COORDINATION: There is no trunk or limb dysmetria noted.  GAIT/STANCE: He can get up from seated position arm crossed, gait is slightly wide-based, but steady and independent  REVIEW OF SYSTEMS:  Full 14 system review of systems performed and notable only for as above All other review of systems were negative.   ALLERGIES: Allergies  Allergen Reactions   Statins Other (See Comments)    MYALGIAS   Aminoglycosides    Botulinum Toxins    Calcium Channel Blockers    Macrolides And Ketolides    Quinine Derivatives    Vioxx [Rofecoxib]     UNSPECIFIED REACTION    Ofloxacin Itching    Topical ofloxacin eyedrops to the left eye with periocular itching.   Toradol [Ketorolac Tromethamine] Rash    UNSPECIFIED REACTION     HOME MEDICATIONS: Current Outpatient Medications  Medication Sig Dispense Refill   ALPRAZolam (XANAX) 0.5 MG tablet TAKE (1) TABLET THREE TIMES DAILY AS NEEDED. 90 tablet 3   BAQSIMI TWO PACK 3 MG/DOSE POWD Place 3 mg into the nose See admin instructions.     Coenzyme Q10 (CO Q-10) 200 MG CAPS Take 200 mg by mouth daily.     DULoxetine (CYMBALTA) 30 MG capsule TAKE ONE CAPSULE ONCE DAILY  (WITH 60MG  TOTAL DOSE 90MG /DAY) 90 capsule 3   DULoxetine (CYMBALTA) 60 MG capsule TAKE ONE CAPSULE DAILY (WITH 30MG  TOTAL DOSE 90MG /DAY) 90 capsule 0   Evolocumab (REPATHA SURECLICK) [id number] MG/ML SOAJ Inject 140 mg into the skin every 14 (fourteen) days. 6 mL 3   ezetimibe (ZETIA) 10 MG tablet Take 10 mg by mouth daily.     FIRDAPSE 10 MG TABS Take 80 mg by mouth daily.     gabapentin (NEURONTIN) 300 MG capsule Take 600 mg by mouth 2 (two) times daily.     HYDROcodone-acetaminophen (NORCO) 10-325 MG tablet Take 1 tablet by mouth every 4 (four) hours as needed for moderate pain or severe pain.     LEVEMIR FLEXTOUCH 100 UNIT/ML FlexPen 10 Units daily.     levothyroxine (SYNTHROID) 137 MCG tablet Take 137 mcg by mouth daily.     lisinopril (PRINIVIL,ZESTRIL) 5 MG tablet TAKE 1 TABLET (5 MG TOTAL) BY MOUTH DAILY. (Patient taking differently: Take 10 mg by mouth daily.) 30 tablet 0   metoprolol succinate (TOPROL XL) 25 MG 24 hr tablet Take 1 tablet (25 mg total) by mouth daily. 90 tablet 3   Multiple Vitamin (MULTIVITAMIN) tablet Take 1 tablet by mouth daily.     NOVOLOG FLEXPEN 100 UNIT/ML FlexPen Inject into the skin. Sliding scale     Omega-3 Fatty Acids (FISH OIL) 1200 MG CAPS Take 1,200 mg by mouth 3 (three) times daily.     predniSONE (DELTASONE) 20 MG tablet Take 40 mg by mouth daily with breakfast.     pyridostigmine (MESTINON) 60 MG tablet Take 1 tablet (60 mg total) by mouth 3 (three) times daily. 270 tablet 3   riTUXimab (RITUXAN) 100 MG/10ML injection Inject into the vein.     rosuvastatin (CRESTOR) 5 MG tablet Take 5 mg by mouth daily.     valACYclovir (VALTREX) 1000 MG tablet Take 1 tablet (1,000 mg total) by mouth 2 (two) times daily. 60 tablet 0   zolpidem (AMBIEN) 10 MG tablet TAKE 1 TABLET AT BEDTIME AS NEEDED FOR SLEEP 30 tablet 2   No current facility-administered medications for this visit.    PAST MEDICAL HISTORY: Past Medical History:  Diagnosis Date   Anomalous coronary  artery origin    The patient underwent cardiac catheterization in 2004.  Coronaries revealed no stenosis.  However he had aberrant takeoff of both the right and left coronaries from the anterior aortic cusp.  There was no evidence of renal artery stenosis   Arthritis    Back pain    Broken fibula  right   Depression    Diabetes mellitus without complication    Dyslipidemia    Dyslipidemia    Ejection fraction    EF 60-65%, echo, March 18, 2011   H/O radioactive iodine thyroid ablation    History of plasmapheresis    Hypertension    Hyperthyroidism    November, 201 to   Lambert-Eaton myasthenic syndrome    Followed at [facility] and Dr. Floyde Parkins   [name]    Murmur    Systolic murmur, December, 2012,No valvular abnormalities, echo, December, 2012   Nuclear sclerotic cataract of left eye 08/06/2021   Obesity    Ocular migraine 11/30/2019   Pre-syncope    February 23, 2011   QT interval    Question prolongation QT interval, October, 2012, review of EKG dated February 04, 2011, QT interval was not prolonged   Shingles     PAST SURGICAL HISTORY: Past Surgical History:  Procedure Laterality Date   KNEE SURGERY Right    left eye surgery  03/2022   5 recent eye surgeries   PORT-A-CATH REMOVAL N/A 11/12/2017   Procedure: REMOVAL PORT-A-CATH;  Surgeon: Clovis Riley, MD;  Location: [facility];  Service: General;  Laterality: N/A;   PORTACATH PLACEMENT     TONSILLECTOMY      FAMILY HISTORY: Family History  Problem Relation Age of Onset   Hypertension Mother    Fibromyalgia Mother    Cirrhosis Father    Autoimmune disease Sister    Hypertension Brother    Cirrhosis Maternal Grandmother        non alcoholic   Heart disease Other    Stroke Other    Cancer Other    Healthy Son    Healthy Son     SOCIAL HISTORY: Social History   Socioeconomic History   Marital status: Married    Spouse name: Caren Griffins   Number of children: 2   Years of education: 12    Highest education level: Not on file  Occupational History   Occupation: Print production planner: [employer]  Tobacco Use   Smoking status: Never   Smokeless tobacco: Never  Vaping Use   Vaping Use: Never used  Substance and Sexual Activity   Alcohol use: Not Currently    Alcohol/week: 0.0 standard drinks of alcohol   Drug use: No   Sexual activity: Yes  Other Topics Concern   Not on file  Social History Narrative   Patient lives at home with his wife Caren Griffins).   Disabled.   Education high school.   Right handed.   Caffeine None .   Social Determinants of Health   Financial Resource Strain: Not on file  Food Insecurity: Not on file  Transportation Needs: Not on file  Physical Activity: Not on file  Stress: Not on file  Social Connections: Not on file  Intimate Partner Violence: Not on file   Butler Denmark, Laqueta Jean, [facility] [address], [place], [place] [address] [phone]

## 2022-07-14 LAB — COMPREHENSIVE METABOLIC PANEL
ALT: 24 IU/L (ref 0–44)
AST: 27 IU/L (ref 0–40)
Albumin/Globulin Ratio: 2 (ref 1.2–2.2)
Albumin: 4.7 g/dL (ref 3.8–4.9)
Alkaline Phosphatase: 78 IU/L (ref 44–121)
BUN/Creatinine Ratio: 12 (ref 9–20)
BUN: 12 mg/dL (ref 6–24)
Bilirubin Total: 0.3 mg/dL (ref 0.0–1.2)
CO2: 26 mmol/L (ref 20–29)
Calcium: 9.6 mg/dL (ref 8.7–10.2)
Chloride: 102 mmol/L (ref 96–106)
Creatinine, Ser: 1.03 mg/dL (ref 0.76–1.27)
Globulin, Total: 2.3 g/dL (ref 1.5–4.5)
Glucose: 98 mg/dL (ref 70–99)
Potassium: 3.8 mmol/L (ref 3.5–5.2)
Sodium: 144 mmol/L (ref 134–144)
Total Protein: 7 g/dL (ref 6.0–8.5)
eGFR: 84 mL/min/{1.73_m2} (ref >59)

## 2022-07-14 LAB — CBC WITH DIFFERENTIAL/PLATELET
Basophils Absolute: 0 10*3/uL (ref 0.0–0.2)
Basos: 0 %
EOS (ABSOLUTE): 0 10*3/uL (ref 0.0–0.4)
Eos: 1 %
Hematocrit: 47.5 % (ref 37.5–51.0)
Hemoglobin: 16.5 g/dL (ref 13.0–17.7)
Immature Grans (Abs): 0 10*3/uL (ref 0.0–0.1)
Immature Granulocytes: 0 %
Lymphocytes Absolute: 1.4 10*3/uL (ref 0.7–3.1)
Lymphs: 17 %
MCH: 32.2 pg (ref 26.6–33.0)
MCHC: 34.7 g/dL (ref 31.5–35.7)
MCV: 93 fL (ref 79–97)
Monocytes Absolute: 1 10*3/uL — ABNORMAL HIGH (ref 0.1–0.9)
Monocytes: 12 %
Neutrophils Absolute: 5.7 10*3/uL (ref 1.4–7.0)
Neutrophils: 70 %
Platelets: 181 10*3/uL (ref 150–450)
RBC: 5.13 x10E6/uL (ref 4.14–5.80)
RDW: 13.2 % (ref 11.6–15.4)
WBC: 8.1 10*3/uL (ref 3.4–10.8)

## 2022-07-14 LAB — IGG, IGA, IGM
IgA/Immunoglobulin A, Serum: 289 mg/dL (ref 90–386)
IgG (Immunoglobin G), Serum: 649 mg/dL (ref 603–1613)
IgM (Immunoglobulin M), Srm: 52 mg/dL (ref 20–172)

## 2022-07-16 ENCOUNTER — Ambulatory Visit: Payer: BC Managed Care – PPO | Admitting: Neurology

## 2022-08-05 DIAGNOSIS — H33012 Retinal detachment with single break, left eye: Secondary | ICD-10-CM | POA: Diagnosis not present

## 2022-08-05 DIAGNOSIS — Z961 Presence of intraocular lens: Secondary | ICD-10-CM | POA: Diagnosis not present

## 2022-08-15 ENCOUNTER — Encounter: Payer: Self-pay | Admitting: Neurology

## 2022-08-24 DIAGNOSIS — L0291 Cutaneous abscess, unspecified: Secondary | ICD-10-CM | POA: Diagnosis not present

## 2022-08-24 DIAGNOSIS — G8929 Other chronic pain: Secondary | ICD-10-CM | POA: Diagnosis not present

## 2022-08-24 DIAGNOSIS — B029 Zoster without complications: Secondary | ICD-10-CM | POA: Diagnosis not present

## 2022-08-24 DIAGNOSIS — E785 Hyperlipidemia, unspecified: Secondary | ICD-10-CM | POA: Diagnosis not present

## 2022-08-24 DIAGNOSIS — I10 Essential (primary) hypertension: Secondary | ICD-10-CM | POA: Diagnosis not present

## 2022-08-24 DIAGNOSIS — F418 Other specified anxiety disorders: Secondary | ICD-10-CM | POA: Diagnosis not present

## 2022-09-17 ENCOUNTER — Telehealth: Payer: Self-pay | Admitting: Pharmacist Clinician (PhC)/ Clinical Pharmacy Specialist

## 2022-09-17 DIAGNOSIS — E785 Hyperlipidemia, unspecified: Secondary | ICD-10-CM

## 2022-09-17 NOTE — Telephone Encounter (Signed)
-----   Message from Rosalee Kaufman, RPH-CPP sent at 06/25/2022  1:02 PM EDT ----- Regarding: ipid labs 3 month lipid labs

## 2022-09-18 ENCOUNTER — Other Ambulatory Visit: Payer: Self-pay

## 2022-09-18 DIAGNOSIS — E785 Hyperlipidemia, unspecified: Secondary | ICD-10-CM

## 2022-09-19 LAB — LIPID PANEL
Chol/HDL Ratio: 2 ratio (ref 0.0–5.0)
Cholesterol, Total: 106 mg/dL (ref 100–199)
HDL: 53 mg/dL (ref >39)
LDL Chol Calc (NIH): 27 mg/dL (ref 0–99)
Triglycerides: 160 mg/dL — ABNORMAL HIGH (ref 0–149)
VLDL Cholesterol Cal: 26 mg/dL (ref 5–40)

## 2022-09-19 LAB — HEPATIC FUNCTION PANEL
ALT: 39 IU/L (ref 0–44)
AST: 40 IU/L (ref 0–40)
Albumin: 4.7 g/dL (ref 3.8–4.9)
Alkaline Phosphatase: 67 IU/L (ref 44–121)
Bilirubin Total: 0.4 mg/dL (ref 0.0–1.2)
Bilirubin, Direct: 0.18 mg/dL (ref 0.00–0.40)
Total Protein: 6.7 g/dL (ref 6.0–8.5)

## 2022-10-02 ENCOUNTER — Ambulatory Visit: Payer: Medicare Other | Attending: Cardiovascular Disease | Admitting: Cardiovascular Disease

## 2022-10-02 DIAGNOSIS — I10 Essential (primary) hypertension: Secondary | ICD-10-CM

## 2022-10-02 DIAGNOSIS — I2510 Atherosclerotic heart disease of native coronary artery without angina pectoris: Secondary | ICD-10-CM | POA: Insufficient documentation

## 2022-10-02 DIAGNOSIS — R931 Abnormal findings on diagnostic imaging of heart and coronary circulation: Secondary | ICD-10-CM | POA: Diagnosis not present

## 2022-10-02 DIAGNOSIS — E119 Type 2 diabetes mellitus without complications: Secondary | ICD-10-CM | POA: Diagnosis not present

## 2022-10-02 DIAGNOSIS — G7080 Lambert-Eaton syndrome, unspecified: Secondary | ICD-10-CM | POA: Diagnosis not present

## 2022-10-02 DIAGNOSIS — Z794 Long term (current) use of insulin: Secondary | ICD-10-CM | POA: Insufficient documentation

## 2022-10-02 DIAGNOSIS — E785 Hyperlipidemia, unspecified: Secondary | ICD-10-CM | POA: Insufficient documentation

## 2022-10-02 DIAGNOSIS — E890 Postprocedural hypothyroidism: Secondary | ICD-10-CM | POA: Diagnosis not present

## 2022-10-02 DIAGNOSIS — E7841 Elevated Lipoprotein(a): Secondary | ICD-10-CM | POA: Diagnosis not present

## 2022-10-02 NOTE — Progress Notes (Unsigned)
Cardiology Office Note    Date:  10/03/2022   ID:  Matthew, Nicholson 10-19-1960, MRN [id number]  PCP:  Rebekah Chesterfield, NP  Cardiologist:  Nicki Guadalajara, MD   4 month F/U cardiology evaluaiton initially referred by Dr. Cleon Gustin at [facility] with multiple cardiovascular comorbidities and elevated calcium score.  History of Present Illness:  [name] is a 62 y.o. male has a history of type 2 diabetes mellitus, remote hyperthyroidism status radioactive iodine with subsequent postprocedural hypothyroidism, hypertension, mixed hyperlipidemia, Eaton-Lambert syndrome/myasthenia gravis, and has had issues with statin intolerance.  He tells me was first diagnosed with hypertension approximately 20 years ago at which time lipids were also significantly elevated.  He has been under treatment for diabetes mellitus for over 6 years and sees Dr. Cleon Gustin at [facility].  Most recently, he has been on a regimen of Zetia 10 mg and has been taking rosuvastatin one half of a 5 mg tablet or 2.5 mg 3 times a week.  Lipid studies on March 02, 2022 showed total cholesterol 242, HDL cholesterol 50, triglycerides 207, LDL cholesterol 154.  Hemoglobin was 15.4.  Creatinine 1.0.  Potassium in September 2023 was 2.1.  TSH is normal at 1.07.  He is diabetic on insulin and currently is on levothyroxine 137 mcg for hypothyroidism.  He has been on lisinopril most recently increased to 10 mg daily for blood pressure control.  He takes Cymbalta for anxiety depression.  He has a remote history of vitreous hemorrhage of his left eye as well as partial retinal detachment.  Remotely, there was concern for questionable prolonged QT interval and 11 years ago in 2013 he was evaluated by Dr. Willa Rough.  An echo Doppler study in December 2012 showed normal LV function without significant valvular abnormalities although at the time he did have a questionable cardiac murmur.  On April 14, 2012 he underwent a  calcium score ordered by Dr. Cleon Gustin which was elevated at 777 Agatston units, representing 96 percentile with LAD calcification at 160, left circumflex at 338, and RCA at 279.  He denies any recent chest pain.  He exercises only intermittently.  He denies any presyncope or syncope.    When I saw him for my initial cardiology evaluation on April 23, 2022 he was completely asymptomatic and denied ever experiencing any chest pain.  In addition he denied any exertional dyspnea or palpitations.  With his abnormal calcium score, I suggested that he try increasing rosuvastatin from 2.5 mg to see if he could tolerate at least 5 mg 3 days/week with possible increasing to daily.  I recommended he undergo coronary CTA for more definitive evaluation of his abnormal calcium score to assess for percent luminal obstruction of his multivessel coronary calcification.  I also recommended follow-up laboratory including an evaluation for LP(a) and I discussed this is an independent risk factor for significant CAD and potential vulnerable plaque.  He underwent his coronary CTA on May 01, 2022.  Calcium score on statin therapy had increased to 895 placing him in the 96 percentile.  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  The right coronary artery took a normal course and did not appear "compressed" by the PA.  He was found to have multivessel disease with RCA stenoses at 25 to 49% ostially and 1 to 24% proximally with 24 to 49% distal stenosis.  Left  main he had 1 to 24% stenosis.  The LAD had 25 to 49% proximal stenosis with moderate 50 to 69% mid stenosis.  The circumflex vessel with subjective motion artifact and had several small high OM branches.  The OM 3 branch which was small had possible stenosis greater than 70%.  FFR analysis was subsequently performed with abnormality in the very distal circumflex.  There was evidence for aortic  atherosclerosis.  Chest CT over read did not show any acute extracardiac findings.  Subsequent laboratory from April 27, 2022 showed total cholesterol 152, triglycerides 101, HDL 57 VLDL 18 and LDL cholesterol had improved to 77.  LP(a) was significantly increased at 315.3.  I last saw him on May 19, 2022.  He was in the office with his wife.  At that time he denied any chest pain or shortness of breath.  He remained completely asymptomatic.  With his coronary calcification and significant LP(a) elevation, I recommended initiation of PCSK9 inhibition and he was referred to our pharmacist for initiation of therapy.  He was seen by Nile Riggs, RPH-CPP on June 17, 2022 and Repatha was initiated.  He has tolerated the every 2-week treatments.  He continues to be asymptomatic.  Most recent lipid panel from September 18, 2022 shows dramatic response with total cholesterol at 106, HDL cholesterol 53, and LDL cholesterol is now markedly reduced at 27.  Triglycerides remain minimally increased at 160.  He is here in the office today with his wife for follow-up evaluation.  Past Medical History:  Diagnosis Date   Anomalous coronary artery origin    The patient underwent cardiac catheterization in 2004.  Coronaries revealed no stenosis.  However he had aberrant takeoff of both the right and left coronaries from the anterior aortic cusp.  There was no evidence of renal artery stenosis   Arthritis    Back pain    Broken fibula    right   Depression    Diabetes mellitus without complication (HCC)    Dyslipidemia    Dyslipidemia    Ejection fraction    EF 60-65%, echo, March 18, 2011   H/O radioactive iodine thyroid ablation    History of plasmapheresis    Hypertension    Hyperthyroidism    November, 201 to   Lambert-Eaton myasthenic syndrome (HCC)    Followed at [facility]. and Dr. Lesia Sago   [name] [facility])    Murmur    Systolic murmur, December, 2012,No valvular abnormalities,  echo, December, 2012   Nuclear sclerotic cataract of left eye 08/06/2021   Obesity    Ocular migraine 11/30/2019   Pre-syncope    February 23, 2011   QT interval    Question prolongation QT interval, October, 2012, review of EKG dated February 04, 2011, QT interval was not prolonged   Shingles     Past Surgical History:  Procedure Laterality Date   KNEE SURGERY Right    left eye surgery  03/2022   5 recent eye surgeries   PORT-A-CATH REMOVAL N/A 11/12/2017   Procedure: REMOVAL PORT-A-CATH;  Surgeon: Berna Bue, MD;  Location: [facility] OR;  Service: General;  Laterality: N/A;   PORTACATH PLACEMENT     TONSILLECTOMY      Current Medications: Outpatient Medications Prior to Visit  Medication Sig Dispense Refill   ALPRAZolam (XANAX) 0.5 MG tablet TAKE (1) TABLET THREE TIMES DAILY AS NEEDED. 90 tablet 3   BAQSIMI TWO PACK 3 MG/DOSE POWD Place 3 mg into the nose See admin  instructions.     Coenzyme Q10 (CO Q-10) 200 MG CAPS Take 200 mg by mouth daily.     DULoxetine (CYMBALTA) 30 MG capsule TAKE ONE CAPSULE ONCE DAILY (WITH 60MG  TOTAL DOSE 90MG /DAY) 90 capsule 3   DULoxetine (CYMBALTA) 60 MG capsule TAKE ONE CAPSULE DAILY (WITH 30MG  TOTAL DOSE 90MG /DAY) 90 capsule 3   Evolocumab (REPATHA SURECLICK) 140 MG/ML SOAJ Inject 140 mg into the skin every 14 (fourteen) days. 6 mL 3   ezetimibe (ZETIA) 10 MG tablet Take 10 mg by mouth daily.     FIRDAPSE 10 MG TABS Take 80 mg by mouth daily.     gabapentin (NEURONTIN) 300 MG capsule Take 2 capsules (600 mg total) by mouth 2 (two) times daily. 360 capsule 3   HYDROcodone-acetaminophen (NORCO) 10-325 MG tablet Take 1 tablet by mouth every 4 (four) hours as needed for moderate pain or severe pain.     LEVEMIR FLEXTOUCH 100 UNIT/ML FlexPen 30 Units 2 (two) times daily.     levothyroxine (SYNTHROID) 137 MCG tablet Take 137 mcg by mouth daily.     lisinopril (PRINIVIL,ZESTRIL) 5 MG tablet TAKE 1 TABLET (5 MG TOTAL) BY MOUTH DAILY. (Patient taking  differently: Take 10 mg by mouth daily.) 30 tablet 0   metoprolol succinate (TOPROL XL) 25 MG 24 hr tablet Take 1 tablet (25 mg total) by mouth daily. 90 tablet 3   Multiple Vitamin (MULTIVITAMIN) tablet Take 1 tablet by mouth daily.     NOVOLOG FLEXPEN 100 UNIT/ML FlexPen Inject into the skin. Sliding scale     Omega-3 Fatty Acids (FISH OIL) 1200 MG CAPS Take 1,200 mg by mouth 3 (three) times daily.     pyridostigmine (MESTINON) 60 MG tablet Take 1 tablet (60 mg total) by mouth 3 (three) times daily. 270 tablet 3   riTUXimab (RITUXAN) 100 MG/10ML injection Inject into the vein.     rosuvastatin (CRESTOR) 5 MG tablet Take 5 mg by mouth daily.     valACYclovir (VALTREX) 1000 MG tablet Take 1 tablet (1,000 mg total) by mouth 2 (two) times daily. 60 tablet 0   zolpidem (AMBIEN) 10 MG tablet TAKE 1 TABLET AT BEDTIME AS NEEDED FOR SLEEP 30 tablet 2   predniSONE (DELTASONE) 20 MG tablet Take 40 mg by mouth daily with breakfast. (Patient not taking: Reported on 10/02/2022)     No facility-administered medications prior to visit.     Allergies:   Statins, Aminoglycosides, Botulinum toxins, Calcium channel blockers, Macrolides and ketolides, Quinine derivatives, Vioxx [rofecoxib], Ofloxacin, and Toradol [ketorolac tromethamine]   Social History   Socioeconomic History   Marital status: Married    Spouse name: Aram Beecham   Number of children: 2   Years of education: 12   Highest education level: Not on file  Occupational History   Occupation: Field seismologist: [employer]  Tobacco Use   Smoking status: Never   Smokeless tobacco: Never  Vaping Use   Vaping Use: Never used  Substance and Sexual Activity   Alcohol use: Not Currently    Alcohol/week: 0.0 standard drinks of alcohol   Drug use: No   Sexual activity: Yes  Other Topics Concern   Not on file  Social History Narrative   Patient lives at home with his wife Aram Beecham).   Disabled.   Education high school.    Right handed.   Caffeine None .   Social Determinants of Health   Financial Resource Strain: Not on file  Food  Insecurity: Not on file  Transportation Needs: Not on file  Physical Activity: Not on file  Stress: Not on file  Social Connections: Not on file    Social history is notable that he was born in [place].  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  He does not routinely exercise.  There is no tobacco history.  He is married for 37 years and has 2 children ages 44 and 73 prolonged grandchild.  Family History:  The patient's family history includes Autoimmune disease in his sister; Cancer in an other family member; Cirrhosis in his father and maternal grandmother; Fibromyalgia in his mother; Healthy in his son and son; Heart disease in an other family member; Hypertension in his brother and mother; Stroke in an other family member.  Mother is living at age 18.  Father died with cirrhosis at age 1.  He has a brother age 73 with hypertension.  He has a sister age 72 who is blind in 1 eye.  ROS General: Negative; No fevers, chills, or night sweats;  HEENT: History of 4 eye surgeries for detached retina with Dr. Luciana Axe. Pulmonary: Negative; No cough, wheezing, shortness of breath, hemoptysis Cardiovascular: Negative; No chest pain, presyncope, syncope, palpitations GI: Negative; No nausea, vomiting, diarrhea, or abdominal pain GU: Negative; No dysuria, hematuria, or difficulty voiding Musculoskeletal: Show dreams syndrome Hematologic/Oncology: Negative; no easy bruising, bleeding Endocrine: History of hyperthyroidism status post radioactive iodine with postprocedure hypothyroidism is him; insulin-dependent diabetes mellitus. Neuro: Pincus Badder syndrome/myasthenia gravis Skin: Negative; No rashes or skin lesions Psychiatric: Negative; No behavioral problems, depression Sleep: Negative; No snoring, daytime sleepiness, hypersomnolence, bruxism, restless legs,  hypnogognic hallucinations, no cataplexy  An Epworth Sleepiness Scale score was calculated in the office  and this endorsed at 4 arguing against excessive daytime sleepiness.  Other comprehensive 14 point system review is negative.   PHYSICAL EXAM:   VS:  BP 102/60   Pulse (!) 56   Ht 5\' 10"  (1.778 m)   Wt 222 lb 3.2 oz (100.8 kg)   SpO2 98%   BMI 31.88 kg/m     Repeat blood pressure by me was 118/68  Wt Readings from Last 3 Encounters:  10/02/22 222 lb 3.2 oz (100.8 kg)  07/13/22 213 lb (96.6 kg)  05/19/22 210 lb (95.3 kg)    General: Alert, oriented, no distress.  Skin: normal turgor, no rashes, warm and dry HEENT: Normocephalic, atraumatic. Pupils equal round and reactive to light; sclera anicteric; extraocular muscles intact; Nose without nasal septal hypertrophy Mouth/Parynx benign; Mallinpatti scale 3 Neck: No JVD, no carotid bruits; normal carotid upstroke Lungs: clear to ausculatation and percussion; no wheezing or rales Chest wall: without tenderness to palpitation Heart: PMI not displaced, RRR, s1 s2 normal, 1/6 systolic murmur, no diastolic murmur, no rubs, gallops, thrills, or heaves Abdomen: soft, nontender; no hepatosplenomehaly, BS+; abdominal aorta nontender and not dilated by palpation. Back: no CVA tenderness Pulses 2+ Musculoskeletal: full range of motion, normal strength, no joint deformities Extremities: no clubbing cyanosis or edema, Homan's sign negative  Neurologic: grossly nonfocal; Cranial nerves grossly wnl Psychologic: Normal mood and affect     Studies/Labs Reviewed:    EKG Interpretation  Date/Time:  Friday October 02 2022 14:26:15 EDT Ventricular Rate:  54 PR Interval:  174 QRS Duration: 88 QT Interval:  402 QTC Calculation: 381 R Axis:   9 Text Interpretation: Sinus bradycardia When compared with ECG of 10-Nov-2017 08:54, No significant change was found Confirmed by Nicki Guadalajara (  [id number]) on 10/02/2022 3:08:59 PM    May 19, 2022  ECG (independently read by me):  NSR at 74, no ST changes, QTc 388  April 23, 2022 ECG (independently read by me):  Sinus bradycardia at 58, no ST changes, no ectopy, QTc 378 msec  Recent Labs:    Latest Ref Rng & Units 07/13/2022    3:15 PM 04/27/2022    8:39 AM 01/08/2022    2:29 PM  BMP  Glucose 70 - 99 mg/dL 98  96  67   BUN 6 - 24 mg/dL 12  15  14    Creatinine 0.76 - 1.27 mg/dL 6.04  5.40  9.81   BUN/Creat Ratio 9 - 20 12  16  15    Sodium 134 - 144 mmol/L 144  141  141   Potassium 3.5 - 5.2 mmol/L 3.8  4.5  4.1   Chloride 96 - 106 mmol/L 102  103  100   CO2 20 - 29 mmol/L 26  25  24    Calcium 8.7 - 10.2 mg/dL 9.6  9.4  9.6         Latest Ref Rng & Units 09/18/2022    8:22 AM 07/13/2022    3:15 PM 04/27/2022    8:39 AM  Hepatic Function  Total Protein 6.0 - 8.5 g/dL 6.7  7.0  6.6   Albumin 3.8 - 4.9 g/dL 4.7  4.7  4.6   AST 0 - 40 IU/L 40  27  22   ALT 0 - 44 IU/L 39  24  21   Alk Phosphatase 44 - 121 IU/L 67  78  69   Total Bilirubin 0.0 - 1.2 mg/dL 0.4  0.3  0.4   Bilirubin, Direct 0.00 - 0.40 mg/dL 1.91          Latest Ref Rng & Units 07/13/2022    3:15 PM 01/08/2022    2:29 PM 07/03/2021    2:59 PM  CBC  WBC 3.4 - 10.8 x10E3/uL 8.1  7.3  6.8   Hemoglobin 13.0 - 17.7 g/dL 47.8  29.5  62.1   Hematocrit 37.5 - 51.0 % 47.5  44.1  45.6   Platelets 150 - 450 x10E3/uL 181  179  152    Lab Results  Component Value Date   MCV 93 07/13/2022   MCV 90 01/08/2022   MCV 90 07/03/2021   Lab Results  Component Value Date   TSH 0.158 (L) 01/08/2022   Lab Results  Component Value Date   HGBA1C 6.6 (H) 07/03/2021     BNP No results found for: "BNP"  ProBNP No results found for: "PROBNP"   Lipid Panel     Component Value Date/Time   CHOL 106 09/18/2022 0822   TRIG 160 (H) 09/18/2022 0822   HDL 53 09/18/2022 0822   CHOLHDL 2.0 09/18/2022 0822   LDLCALC 27 09/18/2022 0822   LABVLDL 26 09/18/2022 0822     RADIOLOGY: No results found.   Additional studies/  records that were reviewed today include:   I reviewed the records of Dr. Cleon Gustin at [facility].   ASSESSMENT:    1. Essential hypertension   2. Coronary artery disease involving native coronary artery of native heart without angina pectoris   3. Elevated coronary artery calcium score: 777 Agatston units and CTA: 895 Agatston units   4. Elevated Lp(a): 315.3   5. Hyperlipidemia with target LDL less than 50.   6. Lambert-Eaton myasthenic syndrome (HCC)   7. Type  2 diabetes mellitus without complication, with long-term current use of insulin (HCC)   8. Postablative hypothyroidism     PLAN:  Mr. 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  He has a longstanding history of hypertension for > 20 years as well as hyperlipidemia.  He has been felt to be statin intolerant.  Prior lipid studies were consistent with mixed hyperlipidemia with triglycerides as high as 400 in the past.  More recent lipid studies in November 2023 showed total cholesterol 242, HDL 50, LDL 154 and triglycerides 811 on a regimen of rosuvastatin 2.5 mg 3 times a week as well as Zetia 10 mg daily.  He also has a history of hypertension and has been on lisinopril at 5 mg which recently had been increased to 10 mg daily.  He has had issues with multiple retinal detachments and has undergone 4 surgeries with Dr. Luciana Axe.  He is also [facility] at [facility].  He is on Toradol and Rituxan for [name] with possible Sjogren's syndrome.  He does not routinely exercise but denies any definitive substernal chest tightness.  A coronary calcium score was significantly elevated at 777 Agatston  units placing him in the 96 percentile with multivessel coronary calcification in the LAD, circumflex and RCA.  When I saw him for my initial evaluation I recommended he try increasing rosuvastatin to at least 5 mg 3 days/week with possible increasing to daily as tolerated.   Subsequent lab work on April 27, 2022 had shown improvement in his LDL cholesterol down to 77.  He was found to have significant elevation of LP(a) at 315.3.  With his multivessel CAD on CT imaging, low-dose metoprolol succinate 25 mg was added.  He has continued to take Zetia 10 mg and rosuvastatin 5 mg and due to his markedly elevated LP(a) with LDL greater than 70, he was successfully able to initiate Repatha.  Laboratory from September 18, 2022 shows dramatic benefit with LDL cholesterol now at 27, total cholesterol 106 HDL 53 and triglycerides remain slightly increased at 160.  We again discussed improvement in diet.  Presently he remains asymptomatic on current therapy.  He sees Derek Jack, NP in [place] for primary care.  I discussed the importance of exercise.  He is mildly obese with BMI of 31.88 and weight loss is recommended.  He continues to be followed by Dr. Marissa Calamity for his diabetes and thyroid disease.  As long as he is stable, I will see him in April/May 2025 or sooner as needed.     Medication Adjustments/Labs and Tests Ordered: Current medicines are reviewed at length with the patient today.  Concerns regarding medicines are outlined above.  Medication changes, Labs and Tests ordered today are listed in the Patient Instructions below. Patient Instructions  Medication Instructions:  Continue same medications *If you need a refill on your cardiac medications before your next appointment, please call your pharmacy*   Lab Work: None ordered   Testing/Procedures: None ordered   Follow-Up: At [facility], you and your health needs are our priority.  As part of our continuing mission to provide you with exceptional heart care, we have created designated Provider Care Teams.  These Care Teams include your primary Cardiologist (physician) and Advanced Practice Providers (APPs -  Physician Assistants and Nurse Practitioners) who all work together to provide you with  the care you need, when you need it.  We recommend signing up for the patient  portal called "MyChart".  Sign up information is provided on this After Visit Summary.  MyChart is used to connect with patients for Virtual Visits (Telemedicine).  Patients are able to view lab/test results, encounter notes, upcoming appointments, etc.  Non-urgent messages can be sent to your provider as well.   To learn more about what you can do with MyChart, go to [email or web address].    Your next appointment:  07/2023 or 08/2023     Call in Jan to schedule April appointment     Provider:  [facility]    Signed, Nicki Guadalajara, MD  10/03/2022 5:34 PM    [facility] [address], [place], [place]  [address] Phone: [phone]   1

## 2022-10-02 NOTE — Patient Instructions (Signed)
Medication Instructions:  Continue same medications *If you need a refill on your cardiac medications before your next appointment, please call your pharmacy*   Lab Work: None ordered   Testing/Procedures: None ordered   Follow-Up: At [facility], you and your health needs are our priority.  As part of our continuing mission to provide you with exceptional heart care, we have created designated Provider Care Teams.  These Care Teams include your primary Cardiologist (physician) and Advanced Practice Providers (APPs -  Physician Assistants and Nurse Practitioners) who all work together to provide you with the care you need, when you need it.  We recommend signing up for the patient portal called "MyChart".  Sign up information is provided on this After Visit Summary.  MyChart is used to connect with patients for Virtual Visits (Telemedicine).  Patients are able to view lab/test results, encounter notes, upcoming appointments, etc.  Non-urgent messages can be sent to your provider as well.   To learn more about what you can do with MyChart, go to [email or web address].    Your next appointment:  07/2023 or 08/2023     Call in Jan to schedule April appointment     Provider:  [facility]

## 2022-10-03 ENCOUNTER — Encounter: Payer: Self-pay | Admitting: Cardiovascular Disease

## 2022-10-07 DIAGNOSIS — I10 Essential (primary) hypertension: Secondary | ICD-10-CM | POA: Diagnosis not present

## 2022-10-07 DIAGNOSIS — E890 Postprocedural hypothyroidism: Secondary | ICD-10-CM | POA: Diagnosis not present

## 2022-10-07 DIAGNOSIS — E291 Testicular hypofunction: Secondary | ICD-10-CM | POA: Diagnosis not present

## 2022-10-07 DIAGNOSIS — E1165 Type 2 diabetes mellitus with hyperglycemia: Secondary | ICD-10-CM | POA: Diagnosis not present

## 2022-11-26 DIAGNOSIS — I10 Essential (primary) hypertension: Secondary | ICD-10-CM | POA: Diagnosis not present

## 2022-11-26 DIAGNOSIS — E785 Hyperlipidemia, unspecified: Secondary | ICD-10-CM | POA: Diagnosis not present

## 2022-11-26 DIAGNOSIS — G8929 Other chronic pain: Secondary | ICD-10-CM | POA: Diagnosis not present

## 2022-11-26 DIAGNOSIS — F418 Other specified anxiety disorders: Secondary | ICD-10-CM | POA: Diagnosis not present

## 2022-12-04 DIAGNOSIS — J329 Chronic sinusitis, unspecified: Secondary | ICD-10-CM | POA: Diagnosis not present

## 2022-12-07 ENCOUNTER — Other Ambulatory Visit (HOSPITAL_COMMUNITY): Payer: Self-pay

## 2022-12-08 ENCOUNTER — Telehealth: Payer: Self-pay | Admitting: Pharmacy Technician

## 2022-12-08 ENCOUNTER — Other Ambulatory Visit (HOSPITAL_COMMUNITY): Payer: Self-pay

## 2022-12-08 NOTE — Telephone Encounter (Signed)
Faxed over additional information to [phone]

## 2022-12-08 NOTE — Telephone Encounter (Signed)
Pharmacy Patient Advocate Encounter  Received notification from [facility] that Prior Authorization for Repatha has been APPROVED from 12/08/22 to 12/08/23   PA #/Case ID/Reference #: [id number]

## 2022-12-08 NOTE — Telephone Encounter (Signed)
Pharmacy Patient Advocate Encounter   Received notification from CoverMyMeds that prior authorization for Repatha is required/requested.   Insurance verification completed.   The patient is insured through [facility] .   Per test claim: PA required; PA submitted to [employer] via CoverMyMeds Key/confirmation #/EOC [id number] Status is pending

## 2022-12-15 ENCOUNTER — Telehealth: Payer: Self-pay | Admitting: Neurology

## 2022-12-15 NOTE — Telephone Encounter (Signed)
LVM and sent mychart msg informing pt of need to reschedule 01/18/23 appt - MD out

## 2023-01-05 DIAGNOSIS — H1013 Acute atopic conjunctivitis, bilateral: Secondary | ICD-10-CM | POA: Diagnosis not present

## 2023-01-05 DIAGNOSIS — E119 Type 2 diabetes mellitus without complications: Secondary | ICD-10-CM | POA: Diagnosis not present

## 2023-01-05 DIAGNOSIS — H35412 Lattice degeneration of retina, left eye: Secondary | ICD-10-CM | POA: Diagnosis not present

## 2023-01-05 DIAGNOSIS — H35352 Cystoid macular degeneration, left eye: Secondary | ICD-10-CM | POA: Diagnosis not present

## 2023-01-18 ENCOUNTER — Ambulatory Visit: Payer: BC Managed Care – PPO | Admitting: Neurology

## 2023-02-09 DIAGNOSIS — G7080 Lambert-Eaton syndrome, unspecified: Secondary | ICD-10-CM | POA: Diagnosis not present

## 2023-02-26 DIAGNOSIS — G8929 Other chronic pain: Secondary | ICD-10-CM | POA: Diagnosis not present

## 2023-02-26 DIAGNOSIS — R21 Rash and other nonspecific skin eruption: Secondary | ICD-10-CM | POA: Diagnosis not present

## 2023-02-26 DIAGNOSIS — I10 Essential (primary) hypertension: Secondary | ICD-10-CM | POA: Diagnosis not present

## 2023-02-26 DIAGNOSIS — E785 Hyperlipidemia, unspecified: Secondary | ICD-10-CM | POA: Diagnosis not present

## 2023-02-26 DIAGNOSIS — F418 Other specified anxiety disorders: Secondary | ICD-10-CM | POA: Diagnosis not present

## 2023-03-29 ENCOUNTER — Other Ambulatory Visit: Payer: Self-pay | Admitting: Cardiovascular Disease

## 2023-04-26 DIAGNOSIS — E890 Postprocedural hypothyroidism: Secondary | ICD-10-CM | POA: Diagnosis not present

## 2023-04-26 DIAGNOSIS — I10 Essential (primary) hypertension: Secondary | ICD-10-CM | POA: Diagnosis not present

## 2023-04-26 DIAGNOSIS — E291 Testicular hypofunction: Secondary | ICD-10-CM | POA: Diagnosis not present

## 2023-04-26 DIAGNOSIS — E1165 Type 2 diabetes mellitus with hyperglycemia: Secondary | ICD-10-CM | POA: Diagnosis not present

## 2023-06-04 DIAGNOSIS — F418 Other specified anxiety disorders: Secondary | ICD-10-CM | POA: Diagnosis not present

## 2023-06-04 DIAGNOSIS — G8929 Other chronic pain: Secondary | ICD-10-CM | POA: Diagnosis not present

## 2023-06-04 DIAGNOSIS — R21 Rash and other nonspecific skin eruption: Secondary | ICD-10-CM | POA: Diagnosis not present

## 2023-06-04 DIAGNOSIS — E785 Hyperlipidemia, unspecified: Secondary | ICD-10-CM | POA: Diagnosis not present

## 2023-06-04 DIAGNOSIS — I10 Essential (primary) hypertension: Secondary | ICD-10-CM | POA: Diagnosis not present

## 2023-06-16 ENCOUNTER — Encounter: Payer: Self-pay | Admitting: Neurology

## 2023-06-21 ENCOUNTER — Other Ambulatory Visit: Payer: Self-pay | Admitting: Cardiovascular Disease

## 2023-06-21 ENCOUNTER — Other Ambulatory Visit: Payer: Self-pay | Admitting: Neurology

## 2023-07-06 DIAGNOSIS — H1013 Acute atopic conjunctivitis, bilateral: Secondary | ICD-10-CM | POA: Diagnosis not present

## 2023-07-06 DIAGNOSIS — H35412 Lattice degeneration of retina, left eye: Secondary | ICD-10-CM | POA: Diagnosis not present

## 2023-07-06 DIAGNOSIS — H35352 Cystoid macular degeneration, left eye: Secondary | ICD-10-CM | POA: Diagnosis not present

## 2023-07-06 DIAGNOSIS — E119 Type 2 diabetes mellitus without complications: Secondary | ICD-10-CM | POA: Diagnosis not present

## 2023-07-15 ENCOUNTER — Telehealth: Payer: Self-pay | Admitting: Neurology

## 2023-07-15 ENCOUNTER — Ambulatory Visit (INDEPENDENT_AMBULATORY_CARE_PROVIDER_SITE_OTHER): Admitting: Neurology

## 2023-07-15 VITALS — BP 145/81 | HR 60 | Ht 70.0 in | Wt 231.0 lb

## 2023-07-15 DIAGNOSIS — E1151 Type 2 diabetes mellitus with diabetic peripheral angiopathy without gangrene: Secondary | ICD-10-CM | POA: Diagnosis not present

## 2023-07-15 DIAGNOSIS — R52 Pain, unspecified: Secondary | ICD-10-CM

## 2023-07-15 DIAGNOSIS — Z794 Long term (current) use of insulin: Secondary | ICD-10-CM

## 2023-07-15 DIAGNOSIS — G7080 Lambert-Eaton syndrome, unspecified: Secondary | ICD-10-CM

## 2023-07-15 DIAGNOSIS — F32A Depression, unspecified: Secondary | ICD-10-CM

## 2023-07-15 NOTE — Progress Notes (Addendum)
 ASSESSMENT AND PLAN  [name] is a 63 y.o. 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,   Treated with rituximab 1000 mg every 6 months since 2005, in December 2022 the interval was decreased to every 5 months (500 units 2 weeks apart) because his reported early wearing off, last infusion was in July 2023  Continue Firdapse 10 mg 2 tablets 4 times a day, is getting prescription from [facility], [name] complains of GI side effect with high-dose, also has mild bradycardia, now taking 60mg  bid  Noticed increased weakness after stopping rituximab, would like to resume, start prior authorization paperwork  Laboratory evaluation for baseline  Diabetic peripheral neuropathy  Mild length-dependent sensory changes  Depression body achy pain  Under good control taking Cymbalta 90 mg daily     DIAGNOSTIC DATA (LABS, IMAGING, TESTING) - I reviewed patient records, labs, notes, testing and imaging myself where available.   MEDICAL HISTORY:  [name], is a 63 year old male, follow-up for [name], was diagnosed in 60.  Previously patient of Dr. Anne Hahn,  I reviewed and summarized multiple previous notes including [facility] Dr. Elberta Leatherwood note.  [name] presented with lower extremity weakness, later progressed to involve his upper extremity, also had ptosis, diplopia, dry eye and dry mouth.  [name] underwent multiple evaluation for malignancy, all have been negative.  His diagnosis is based on positive P/Q type voltage-gated calcium channel antibodies.  [name] had extensive treatment history, lack of response to prednisone, Cytoxan, Imuran, CellCept.  [name] did respond to IVIG, [name] had been receiving IVIG on a weekly basis from 1993-2006.  [name] was started on 3 4 DAP in 2001, result in sustained improvement.  Mestinon was started in 2002, [name] does complains of GI side effect with higher dose, also have a history of bradycardia.  From 2005,  [name] has been treated with rituximab, initially was receiving 1000 mg IV infusion 2 weeks apart every 6 months.  From December 2022, [name] complains of returning generalized weakness fatigue at the end of the 4 months.  The interval has been shortened to every 5 months, [name] tolerated well.  Most recent office visit with Dr. Inez Catalina was in November 2022, [name] did report with each infusion [name] felt a boost of energy, less dyspnea on exertion, [name] can walk better, but it does not help his dry eye and dry mouth much.  Since 3,4-DAP is no longer on the market, it was replaced by Firdapse, 10 mg 2 tablets 4 times a day,  [name] was also seen by [facility] rheumatologist Dr. Tami Ribas, for positive ANA, double-stranded DNA,  Rituximab infusion through intrafusion on October 14, 2020 500 units, July 26 500 units, December 13 500/December/27 500 units, next infusion will be on May 30/June 13,  Patient reported that when [name] is taking prednisone along with intra fusion, [name] does better, but understand the potential long-term side effect with prednisone, [name] has diabetes, use insulin shots  [name] complains of frequent painful blister broke out at the perioral area, was treated with Valtrex as needed  In addition, [name] complains of extreme stress at home, Cymbalta 60 mg daily has helped his body achy pain and depression, and work part-time job as an Lexicographer for [employer], feel fulfilled by helping people and animals.  [name] still have significant dry mouth, intermittent oblique double vision,  UPDATE Sept 28 2023: Lambert-Eaton symptoms is overall under reasonable control, received rituximab infusion in July 2023,  was seen by Dr. Beckie Busing March 04, 2021, continued on Ruzurgi 40 mg twice a day, Mestinon 60 mg twice a day, [name] reported the benefit following infusion,  [name] has terrible dry eye, was seen by rheumatology at [facility] in the past, consider diagnosis of Sjogren's disease, in the setting of positive ANA,  [name]  was seen by ophthalmologist Dr. Fawn Kirk, after diagnosis of left eye vitreous hemorrhage, new partial retinal detachment with multiple defect, now left vision is improved,  His wife noticed since 2023 [name] has vivid dreams during sleep, excessive movement, snoring, excessive daytime sleepiness, fatigue,  Higher dose of Cymbalta 90 mg daily helped his body achy pain  Update July 15 2023: [name] was relatively stable taking rituximab 500-day 1/5 every day 15 every 6 months, also Firdapse 40 mg twice a day,  [name] suffered a lot of left eye issues, wants to focus on dealing with her left eye issue, stopped taking rituximab, last infusion through intra- fusion was in June 2023  Left retinal detachment in April 2024, had laser procedure, scleral band, left cataract surgery by Retinal Specialist Dr. Barbaraann Barthel  I also reviewed last visit with [facility] Dr. Alexander Bergeron on February 09, 2023,  [name] has been on a stable regiment of Firdapse 40 mg twice daily and Mestinon 60 mg twice daily for many years with intermittent infusions of rituximab   [name] did notice a difference recently, feeling weaker, reported two fall accident over past 2 weeks, most recent 1 was clearly related to hypoglycemia episode, woke up in the middle of the night by his glucose monitoring, blood glucose level was in 30s, symptoms quickly improved after glucose supplement  The other episode was while [name] was talking with his coworker, standing position for a while, suddenly lower extremity gave out underneath him, denies swallowing difficulty,  PHYSICAL EXAM:   Vitals:   07/15/23 1500 07/15/23 1501  BP: (!) 149/80 (!) 145/81  Pulse: (!) 59 60  SpO2: 94% 92%   PHYSICAL EXAMNIATION:  Gen: NAD, conversant, well nourised, well groomed                     Cardiovascular: Regular rate rhythm, no peripheral edema, warm, nontender. Eyes: Conjunctivae clear without exudates or hemorrhage Neck: Supple, no carotid  bruits. Pulmonary: Clear to auscultation bilaterally   NEUROLOGICAL EXAM:  MENTAL STATUS: Speech/cognition: Awake, alert, oriented to history taking and casual conversation   CRANIAL NERVES: CN II: Visual fields are full to confrontation. Pupils are round equal and briskly reactive to light. CN III, IV, VI: extraocular movement are normal, no ptosis CN V: Facial sensation is intact to light touch CN VII: Face is symmetric with normal eye closure  CN VIII: Hearing is normal to causal conversation. CN IX, X: Phonation is normal. CN XI: Head turning and shoulder shrug are intact CN XII: Tongue movement is normal, has visible dry oral mucus  MOTOR: There is no pronator drift of out-stretched arms. Muscle bulk and tone are normal. Muscle strength is normal.  REFLEXES: Reflexes are absent and symmetric at the biceps, triceps, knees, and ankles. Plantar responses are flexor.  SENSORY: Length-dependent decreased light touch pinprick to distal shin level  COORDINATION: There is no trunk or limb dysmetria noted.  GAIT/STANCE: [name] can get up from seated position arm crossed  REVIEW OF SYSTEMS:  Full 14 system review of systems performed and notable only for as above All other review of systems were negative.   ALLERGIES: Allergies  Allergen Reactions   Statins Other (See Comments)    MYALGIAS   Aminoglycosides    Botulinum Toxins    Calcium Channel Blockers    Macrolides And Ketolides    Quinine Derivatives    Vioxx [Rofecoxib]     UNSPECIFIED REACTION    Ofloxacin Itching    Topical ofloxacin eyedrops to the left eye with periocular itching.   Toradol [Ketorolac Tromethamine] Rash    UNSPECIFIED REACTION     HOME MEDICATIONS: Current Outpatient Medications  Medication Sig Dispense Refill   ALPRAZolam (XANAX) 0.5 MG tablet TAKE (1) TABLET THREE TIMES DAILY AS NEEDED. 90 tablet 3   BAQSIMI TWO PACK 3 MG/DOSE POWD Place 3 mg into the nose See admin instructions.      Coenzyme Q10 (CO Q-10) 200 MG CAPS Take 200 mg by mouth daily.     DULoxetine (CYMBALTA) 30 MG capsule TAKE ONE CAPSULE ONCE DAILY WITH 60 MG CAPSULE 90 capsule 3   DULoxetine (CYMBALTA) 60 MG capsule TAKE ONE CAPSULE DAILY (WITH 30MG  TOTAL DOSE 90MG /DAY) 90 capsule 3   ezetimibe (ZETIA) 10 MG tablet Take 10 mg by mouth daily.     FIRDAPSE 10 MG TABS Take 80 mg by mouth daily.     gabapentin (NEURONTIN) 300 MG capsule TAKE TWO CAPSULES BY MOUTH TWICE DAILY 360 capsule 3   HYDROcodone-acetaminophen (NORCO) 10-325 MG tablet Take 1 tablet by mouth every 4 (four) hours as needed for moderate pain or severe pain.     levothyroxine (SYNTHROID) 137 MCG tablet Take 137 mcg by mouth daily.     lisinopril (PRINIVIL,ZESTRIL) 5 MG tablet TAKE 1 TABLET (5 MG TOTAL) BY MOUTH DAILY. (Patient taking differently: Take 10 mg by mouth daily.) 30 tablet 0   metoprolol succinate (TOPROL-XL) 25 MG 24 hr tablet TAKE ONE TABLET BY MOUTH DAILY 90 tablet 1   Multiple Vitamin (MULTIVITAMIN) tablet Take 1 tablet by mouth daily.     NOVOLOG FLEXPEN 100 UNIT/ML FlexPen Inject into the skin. Sliding scale     Omega-3 Fatty Acids (FISH OIL) 1200 MG CAPS Take 1,200 mg by mouth 3 (three) times daily.     predniSONE (DELTASONE) 20 MG tablet Take 40 mg by mouth daily with breakfast.     pyridostigmine (MESTINON) 60 MG tablet Take 1 tablet (60 mg total) by mouth 3 (three) times daily. 270 tablet 3   REPATHA SURECLICK 140 MG/ML SOAJ INJECT 140 MG INTO SKIN EVERY 14 DAYS 6 mL 3   riTUXimab (RITUXAN) 100 MG/10ML injection Inject into the vein.     rosuvastatin (CRESTOR) 5 MG tablet Take 5 mg by mouth daily.     TRESIBA FLEXTOUCH 100 UNIT/ML FlexTouch Pen Inject 30 Units into the skin 2 (two) times daily.     valACYclovir (VALTREX) 1000 MG tablet Take 1 tablet (1,000 mg total) by mouth 2 (two) times daily. 60 tablet 0   zolpidem (AMBIEN) 10 MG tablet TAKE 1 TABLET AT BEDTIME AS NEEDED FOR SLEEP 30 tablet 2   No current  facility-administered medications for this visit.    PAST MEDICAL HISTORY: Past Medical History:  Diagnosis Date   Anomalous coronary artery origin    The patient underwent cardiac catheterization in 2004.  Coronaries revealed no stenosis.  However [name] had aberrant takeoff of both the right and left coronaries from the anterior aortic cusp.  There was no evidence of renal artery stenosis   Arthritis    Back pain    Broken fibula  right   Depression    Diabetes mellitus without complication (HCC)    Dyslipidemia    Dyslipidemia    Ejection fraction    EF 60-65%, echo, March 18, 2011   H/O radioactive iodine thyroid ablation    History of plasmapheresis    Hypertension    Hyperthyroidism    November, 201 to   Lambert-Eaton myasthenic syndrome (HCC)    Followed at [facility]. and Dr. Lesia Sago   [name] [facility])    Murmur    Systolic murmur, December, 2012,No valvular abnormalities, echo, December, 2012   Nuclear sclerotic cataract of left eye 08/06/2021   Obesity    Ocular migraine 11/30/2019   Pre-syncope    February 23, 2011   QT interval    Question prolongation QT interval, October, 2012, review of EKG dated February 04, 2011, QT interval was not prolonged   Shingles     PAST SURGICAL HISTORY: Past Surgical History:  Procedure Laterality Date   KNEE SURGERY Right    left eye surgery  03/2022   5 recent eye surgeries   PORT-A-CATH REMOVAL N/A 11/12/2017   Procedure: REMOVAL PORT-A-CATH;  Surgeon: Berna Bue, MD;  Location: [facility] OR;  Service: General;  Laterality: N/A;   PORTACATH PLACEMENT     TONSILLECTOMY      FAMILY HISTORY: Family History  Problem Relation Age of Onset   Hypertension Mother    Fibromyalgia Mother    Cirrhosis Father    Autoimmune disease Sister    Hypertension Brother    Cirrhosis Maternal Grandmother        non alcoholic   Heart disease Other    Stroke Other    Cancer Other    Healthy Son    Healthy Son      SOCIAL HISTORY: Social History   Socioeconomic History   Marital status: Married    Spouse name: Aram Beecham   Number of children: 2   Years of education: 12   Highest education level: Not on file  Occupational History   Occupation: Field seismologist: [employer]  Tobacco Use   Smoking status: Never   Smokeless tobacco: Never  Vaping Use   Vaping status: Never Used  Substance and Sexual Activity   Alcohol use: Not Currently    Alcohol/week: 0.0 standard drinks of alcohol   Drug use: No   Sexual activity: Yes  Other Topics Concern   Not on file  Social History Narrative   Patient lives at home with his wife Aram Beecham).   Disabled.   Education high school.   Right handed.   Caffeine None .   Social Drivers of Corporate investment banker Strain: Not on file  Food Insecurity: Not on file  Transportation Needs: Not on file  Physical Activity: Not on file  Stress: Not on file  Social Connections: Not on file  Intimate Partner Violence: Not on file     Thankfully no complaints to  Levert Feinstein, M.D. Ph.D.  [facility] [address], [address] [place], [place] [address] Ph: [phone] Fax: [phone]  CC:  Rebekah Chesterfield, NP [address] [place] [address] [place],  [place] [address]  Rebekah Chesterfield, NP

## 2023-07-15 NOTE — Telephone Encounter (Signed)
 Added new [employer] supplement into chart.

## 2023-07-16 LAB — CBC WITH DIFFERENTIAL/PLATELET
Basophils Absolute: 0 10*3/uL (ref 0.0–0.2)
Basos: 1 %
EOS (ABSOLUTE): 0.3 10*3/uL (ref 0.0–0.4)
Eos: 4 %
Hematocrit: 44.6 % (ref 37.5–51.0)
Hemoglobin: 15.1 g/dL (ref 13.0–17.7)
Immature Grans (Abs): 0 10*3/uL (ref 0.0–0.1)
Immature Granulocytes: 0 %
Lymphocytes Absolute: 1.7 10*3/uL (ref 0.7–3.1)
Lymphs: 24 %
MCH: 30.7 pg (ref 26.6–33.0)
MCHC: 33.9 g/dL (ref 31.5–35.7)
MCV: 91 fL (ref 79–97)
Monocytes Absolute: 0.8 10*3/uL (ref 0.1–0.9)
Monocytes: 11 %
Neutrophils Absolute: 4.3 10*3/uL (ref 1.4–7.0)
Neutrophils: 60 %
Platelets: 156 10*3/uL (ref 150–450)
RBC: 4.92 x10E6/uL (ref 4.14–5.80)
RDW: 13.2 % (ref 11.6–15.4)
WBC: 7 10*3/uL (ref 3.4–10.8)

## 2023-07-16 LAB — COMPREHENSIVE METABOLIC PANEL WITH GFR
ALT: 28 IU/L (ref 0–44)
AST: 25 IU/L (ref 0–40)
Albumin: 4.3 g/dL (ref 3.8–4.9)
Alkaline Phosphatase: 99 IU/L (ref 44–121)
BUN/Creatinine Ratio: 10 (ref 10–24)
BUN: 10 mg/dL (ref 8–27)
Bilirubin Total: <0.2 mg/dL (ref 0.0–1.2)
CO2: 25 mmol/L (ref 20–29)
Calcium: 9.3 mg/dL (ref 8.6–10.2)
Chloride: 102 mmol/L (ref 96–106)
Creatinine, Ser: 1.02 mg/dL (ref 0.76–1.27)
Globulin, Total: 1.8 g/dL (ref 1.5–4.5)
Glucose: 147 mg/dL — ABNORMAL HIGH (ref 70–99)
Potassium: 4.7 mmol/L (ref 3.5–5.2)
Sodium: 140 mmol/L (ref 134–144)
Total Protein: 6.1 g/dL (ref 6.0–8.5)
eGFR: 84 mL/min/{1.73_m2} (ref >59)

## 2023-07-16 LAB — THYROID PANEL WITH TSH
Free Thyroxine Index: 1.6 (ref 1.2–4.9)
T3 Uptake Ratio: 29 % (ref 24–39)
T4, Total: 5.6 ug/dL (ref 4.5–12.0)
TSH: 3.75 u[IU]/mL (ref 0.450–4.500)

## 2023-07-16 LAB — CK: Total CK: 187 U/L (ref 41–331)

## 2023-07-17 ENCOUNTER — Encounter: Payer: Self-pay | Admitting: Neurology

## 2023-07-26 ENCOUNTER — Telehealth: Payer: Self-pay | Admitting: Pharmacy Technician

## 2023-07-26 NOTE — Telephone Encounter (Signed)
 Pharmacy Patient Advocate Encounter   Received notification from CoverMyMeds that prior authorization for repatha is required/requested.   Insurance verification completed.   The patient is insured through [facility] .   Per test claim: PA required; PA submitted to above mentioned insurance via CoverMyMeds Key/confirmation #/EOC [name] Status is pending

## 2023-07-27 NOTE — Telephone Encounter (Signed)
 Pharmacy Patient Advocate Encounter  Received notification from [facility] that Prior Authorization for repatha has been APPROVED from 07/26/23 to 07/25/24 . I called his pharmacy and they filled this for 0.00   PA #/Case ID/Reference #: [id number]

## 2023-08-23 ENCOUNTER — Other Ambulatory Visit: Payer: Self-pay | Admitting: Neurology

## 2023-09-28 ENCOUNTER — Other Ambulatory Visit: Payer: Self-pay | Admitting: Cardiovascular Disease

## 2024-01-25 ENCOUNTER — Ambulatory Visit: Admitting: Neurology

## 2024-04-09 DIAGNOSIS — I10 Essential (primary) hypertension: Secondary | ICD-10-CM | POA: Insufficient documentation

## 2024-04-09 DIAGNOSIS — I2510 Atherosclerotic heart disease of native coronary artery without angina pectoris: Secondary | ICD-10-CM | POA: Insufficient documentation

## 2024-04-09 DIAGNOSIS — E7841 Elevated Lipoprotein(a): Secondary | ICD-10-CM | POA: Insufficient documentation

## 2024-04-09 NOTE — Progress Notes (Signed)
 " Cardiology Office Note:   Date:  04/09/2024  ID:  [name], DOB Sep 11, 1960, MRN [id number] PCP: Matthew Dorothey HERO, NP  [facility] Providers Cardiologist:  Debby Sor, MD (Inactive) { Chief Complaint: No chief complaint on file.     History of Present Illness:   [name] is a 63 y.o. male with a PMH of elevated CAC score, statin intolerance, anomalous RCA, elevated Lp(a), HTN, HLD, DMII, Lambert-Eaton Myasthenic Syndrome, hyperthyroidism s/p RI ablation now hypothyroid, remote retinal detachment who presents for follow up ***.   Past Medical History:  Diagnosis Date   Anomalous coronary artery origin    The patient underwent cardiac catheterization in 2004.  Coronaries revealed no stenosis.  However he had aberrant takeoff of both the right and left coronaries from the anterior aortic cusp.  There was no evidence of renal artery stenosis   Arthritis    Back pain    Broken fibula    right   Depression    Diabetes mellitus without complication (HCC)    Dyslipidemia    Dyslipidemia    Ejection fraction    EF 60-65%, echo, March 18, 2011   H/O radioactive iodine thyroid  ablation    History of plasmapheresis    Hypertension    Hyperthyroidism    November, 201 to   Lambert-Eaton myasthenic syndrome (HCC)    Followed at [facility]. and Dr. Francis Bull   [name] [facility])    Murmur    Systolic murmur, December, 2012,No valvular abnormalities, echo, December, 2012   Nuclear sclerotic cataract of left eye 08/06/2021   Obesity    Ocular migraine 11/30/2019   Pre-syncope    February 23, 2011   QT interval    Question prolongation QT interval, October, 2012, review of EKG dated February 04, 2011, QT interval was not prolonged   Shingles      Studies Reviewed:    EKG: ***       Cardiac Studies & Procedures   ______________________________________________________________________________________________          CT SCANS  CT CORONARY  MORPH W/CTA COR W/SCORE 05/01/2022  Addendum 05/01/2022  4:17 PM ADDENDUM REPORT: 05/01/2022 16:15  ADDENDUM: CT images were re-processed and submitted and were able to be sent for FFR.   Electronically Signed By: Vinie JAYSON Maxcy M.D. On: 05/01/2022 16:15  Addendum 05/01/2022  1:41 PM ADDENDUM REPORT: 05/01/2022 13:38  EXAM: OVER-READ INTERPRETATION  CT CHEST  The following report is an over-read performed by radiologist Dr. Rea [facility] on 05/01/2022. This over-read does not include interpretation of cardiac or coronary anatomy or pathology. The coronary CTA interpretation by the cardiologist is attached.  COMPARISON:  None.  FINDINGS: Vascular: Normal heart size. No pericardial effusion. Normal caliber thoracic aorta with no atherosclerotic disease.  Mediastinum/Nodes: Esophagus is unremarkable. No pathologically enlarged lymph nodes seen in the chest.  Lungs/Pleura: Central airways are patent. No consolidation, pleural effusion or pneumothorax.  Upper Abdomen: No acute abnormality.  Musculoskeletal: No chest wall mass or suspicious bone lesions identified.  IMPRESSION: The head of the no acute extracardiac findings.   Electronically Signed By: Rea Marc M.D. On: 05/01/2022 13:38  Narrative HISTORY: 63 yo male with elevated coronary calcium  score  EXAM: Cardiac/Coronary CTA  TECHNIQUE: The patient was scanned on a [employer].  PROTOCOL: A 120 kV prospective scan was triggered in the descending thoracic aorta at 111 HU's. Axial non-contrast 3 mm slices were carried out through the heart.  The data set was analyzed on a dedicated work station and scored using the Agatson method. Gantry rotation speed was 250 msecs and collimation was .6 mm. Beta blockade and 0.8 mg of sl NTG was given. The 3D data set was reconstructed in 5% intervals of the 35-75 % of the R-R cycle. Diastolic phases were analyzed on  a dedicated work station using MPR, MIP and VRT modes. The patient received 100mL OMNIPAQUE  IOHEXOL  350 MG/ML SOLN of contrast.  FINDINGS: Quality: Fair, attenuation artifact, motion artifact, HR 47  Coronary calcium  score: The patient's coronary artery calcium  score is 895, which places the patient in the 96th percentile.  Coronary arteries: Anomalous right coronary with common left and right coronary ostia arising from the left coronary cusp. The right coronary takes a normal course and does not appear compressed by the PA. Right dominance.  Right Coronary Artery: Dominant. Mild 25-49% ostial non-calcified stenosis. There is otherwise minimal 1-24% mixed proximal stenosis and minimal to mild 25-49% mixed distal stenosis. There is minimal 1-24% mixed stenosis of the ostial R-PDA.  Left Main Coronary Artery: Minimal non-calcified stenosis of the distal LM 1-24%. Bifurcates into the LAD and LCx arteries.  Left Anterior Descending Coronary Artery: Large anterior artery that wraps around the apex. There is minimal to mild mixed 25-49% proximal stenosis with low attenuation plaque. D1 branch has mild to moderate mixed stenosis 50-69% of the mid-vessel. The D2 branch bifurcates and has mild 25-49% proximal stenosis with low attenuation plaque.  Left Circumflex Artery: Subject to motion artifact. AV groove vessel - several small high OM branches. The proximal LCX into the OM2 branch is calcified with mild to moderate stenosis. OM3 branch appears to have severe 70-99% ostial non-calcified stenosis. The mid to distal LCx proper has mild to moderate non-calcified stenoses.  Aorta: Normal size, 33 mm at the mid ascending aorta (level of the PA bifurcation) measured double oblique. Aortic atherosclerosis. No dissection.  Aortic Valve: Trileaflet. No calcifications.  Other findings:  Normal pulmonary vein drainage into the left atrium.  Normal left atrial appendage without a  thrombus.  Normal size of the pulmonary artery.  IMPRESSION: 1. At least moderate mixed multivessel CAD, CADRADS = 3/4. CT FFR could not be performed due to motion artifact.  2. Coronary calcium  score of 895. This was 96th percentile for age and sex matched control.  3. Anomalous coronary with common left and right coronary ostia arising from the left coronary cusp. The right coronary artery does not appear compressed by the PA. Right dominance.  4. Aortic atherosclerosis.  5. Consider further symptom-guided ischemia evaluation such as cardiac catheterization if clinically warranted.  Electronically Signed: By: Vinie JAYSON Maxcy M.D. On: 05/01/2022 13:19   CT SCANS  CT CARDIAC SCORING (SELF PAY ONLY) 04/14/2022  Addendum 04/15/2022 11:07 AM ADDENDUM REPORT: 04/15/2022 11:05  EXAM: OVER-READ INTERPRETATION  CT CHEST  The following report is an over-read performed by radiologist Dr. Melinda Blietzof [facility] on 04/15/2022. This over-read does not include interpretation of cardiac or coronary anatomy or pathology. The interpretation by the cardiologist is attached.  COMPARISON:  CT chest 06/27/2005.  FINDINGS: Scout view is unremarkable.  No acute extracardiac findings.  IMPRESSION: No acute extracardiac findings.   Electronically Signed By: Newell Eke M.D. On: 04/15/2022 11:05  Narrative CLINICAL DATA:  Cardiovascular Disease Risk stratification  EXAM: Coronary Calcium  Score  TECHNIQUE: A gated, non-contrast computed tomography scan of the heart was performed using 3mm slice thickness. Axial images were analyzed  on a dedicated workstation. Calcium  scoring of the coronary arteries was performed using the Agatston method.  FINDINGS: Coronary arteries: Normal origins.  Coronary Calcium  Score:  Left main: 0  Left anterior descending artery: 160  Left circumflex artery: 338  Right coronary artery: 279  Total: 777  Percentile:  96th  Pericardium: Normal.  Aorta: Normal caliber of ascending aorta. No aortic atherosclerosis noted.  Non-cardiac: See separate report from [facility].  IMPRESSION: Coronary calcium  score of 777. This was 96th percentile for age-, race-, and sex-matched controls.  RECOMMENDATIONS: Coronary artery calcium  (CAC) score is a strong predictor of incident coronary heart disease (CHD) and provides predictive information beyond traditional risk factors. CAC scoring is reasonable to use in the decision to withhold, postpone, or initiate statin therapy in intermediate-risk or selected borderline-risk asymptomatic adults (age 25-75 years and LDL-C >=70 to <190 mg/dL) who do not have diabetes or established atherosclerotic cardiovascular disease (ASCVD).* In intermediate-risk (10-year ASCVD risk >=7.5% to <20%) adults or selected borderline-risk (10-year ASCVD risk >=5% to <7.5%) adults in whom a CAC score is measured for the purpose of making a treatment decision the following recommendations have been made:  If CAC=0, it is reasonable to withhold statin therapy and reassess in 5 to 10 years, as long as higher risk conditions are absent (diabetes mellitus, family history of premature CHD in first degree relatives (males <55 years; females <65 years), cigarette smoking, or LDL >=190 mg/dL).  If CAC is 1 to 99, it is reasonable to initiate statin therapy for patients >=39 years of age.  If CAC is >=100 or >=75th percentile, it is reasonable to initiate statin therapy at any age.  Cardiology referral should be considered for patients with CAC scores >=400 or >=75th percentile.  *2018 AHA/ACC/AACVPR/AAPA/ABC/ACPM/ADA/AGS/APhA/ASPC/NLA/PCNA Guideline on the Management of Blood Cholesterol: A Report of the American College of Cardiology/American Heart Association Task Force on Clinical Practice Guidelines. J Am Coll Cardiol. 2019;73(24):3168-3209.  Shelda Bruckner,  MD  Electronically Signed: By: Shelda Bruckner M.D. On: 04/15/2022 08:53     ______________________________________________________________________________________________      Risk Assessment/Calculations:   {Does this patient have ATRIAL FIBRILLATION?:[phone]} No BP recorded.  {Refresh Note OR Click here to enter BP  :1}***        Physical Exam:     VS:  There were no vitals taken for this visit. ***    Wt Readings from Last 3 Encounters:  07/15/23 231 lb (104.8 kg)  10/02/22 222 lb 3.2 oz (100.8 kg)  07/13/22 213 lb (96.6 kg)     GEN: Well nourished, well developed, in no acute distress NECK: No JVD; No carotid bruits CARDIAC: ***RRR, no murmurs, rubs, gallops RESPIRATORY:  Clear to auscultation without rales, wheezing or rhonchi  ABDOMEN: Soft, non-tender, non-distended, normal bowel sounds EXTREMITIES:  Warm and well perfused, no edema; No deformity, 2+ radial pulses PSYCH: Normal mood and affect   Assessment & Plan Coronary artery disease involving native coronary artery of native heart without angina pectoris  Essential hypertension  Hyperlipidemia, unspecified hyperlipidemia type  Elevated Lp(a): 315.3  Anomalous coronary artery origin       {Are you ordering a CV Procedure (e.g. stress test, cath, DCCV, TEE, etc)?   Press F2        :[id number]}   This note was written with the assistance of a dictation microphone or AI dictation software. Please excuse any typos or grammatical errors.   Signed, Georganna Archer, MD 04/09/2024 2:57 PM    [facility]  "

## 2024-04-10 ENCOUNTER — Other Ambulatory Visit (HOSPITAL_COMMUNITY): Payer: Self-pay

## 2024-04-10 ENCOUNTER — Encounter: Payer: Self-pay | Admitting: Student in an Organized Health Care Education/Training Program

## 2024-04-10 ENCOUNTER — Ambulatory Visit
Attending: Student in an Organized Health Care Education/Training Program | Admitting: Student in an Organized Health Care Education/Training Program

## 2024-04-10 VITALS — BP 168/78 | HR 70 | Ht 70.0 in | Wt 222.0 lb

## 2024-04-10 DIAGNOSIS — E7841 Elevated Lipoprotein(a): Secondary | ICD-10-CM | POA: Diagnosis not present

## 2024-04-10 DIAGNOSIS — Q245 Malformation of coronary vessels: Secondary | ICD-10-CM | POA: Diagnosis not present

## 2024-04-10 DIAGNOSIS — I25118 Atherosclerotic heart disease of native coronary artery with other forms of angina pectoris: Secondary | ICD-10-CM | POA: Insufficient documentation

## 2024-04-10 DIAGNOSIS — E785 Hyperlipidemia, unspecified: Secondary | ICD-10-CM | POA: Diagnosis not present

## 2024-04-10 DIAGNOSIS — I10 Essential (primary) hypertension: Secondary | ICD-10-CM | POA: Diagnosis not present

## 2024-04-10 DIAGNOSIS — R0602 Shortness of breath: Secondary | ICD-10-CM | POA: Insufficient documentation

## 2024-04-10 DIAGNOSIS — I2510 Atherosclerotic heart disease of native coronary artery without angina pectoris: Secondary | ICD-10-CM

## 2024-04-10 MED ORDER — BLOOD PRESSURE MONITORING DEVI
0 refills | Status: AC
  Filled 2024-04-10: qty 1, 30d supply, fill #0

## 2024-04-10 MED ORDER — ASPIRIN 81 MG PO TBEC: 81.0000 mg | DELAYED_RELEASE_TABLET | Freq: Every day | ORAL | 3 refills | Status: AC

## 2024-04-10 MED ORDER — LISINOPRIL 20 MG PO TABS: 20.0000 mg | ORAL_TABLET | Freq: Every day | ORAL | 3 refills | Status: AC

## 2024-04-10 NOTE — Assessment & Plan Note (Signed)
-   Recent lipid panel by PCP shows perfect LDL.  No changes. Continue Repatha  Continue Zetia

## 2024-04-10 NOTE — Assessment & Plan Note (Signed)
-   Blood pressure is significantly elevated. -Goal BP <130/80 - We will increase his lisinopril . Increase lisinopril  to 20 mg daily (current dose 10 mg) 2-week blood pressure log Prescribe a pressure cuff

## 2024-04-10 NOTE — Patient Instructions (Signed)
 Medication Instructions:  START Aspirin  81 mg daily   INCREASE Lisinopril  to 20 mg daily   *If you need a refill on your cardiac medications before your next appointment, please call your pharmacy*  Testing/Procedures: Echocardiogram   Your physician has requested that you have an echocardiogram. Echocardiography is a painless test that uses sound waves to create images of your heart. It provides your doctor with information about the size and shape of your heart and how well your hearts chambers and valves are working. This procedure takes approximately one hour. There are no restrictions for this procedure. Please do NOT wear cologne, perfume, aftershave, or lotions (deodorant is allowed). Please arrive 15 minutes prior to your appointment time.  Please note: We ask at that you not bring children with you during ultrasound (echo/ vascular) testing. Due to room size and safety concerns, children are not allowed in the ultrasound rooms during exams. Our front office staff cannot provide observation of children in our lobby area while testing is being conducted. An adult accompanying a patient to their appointment will only be allowed in the ultrasound room at the discretion of the ultrasound technician under special circumstances. We apologize for any inconvenience.   Follow-Up: At [facility], you and your health needs are our priority.  As part of our continuing mission to provide you with exceptional heart care, our providers are all part of one team.  This team includes your primary Cardiologist (physician) and Advanced Practice Providers or APPs (Physician Assistants and Nurse Practitioners) who all work together to provide you with the care you need, when you need it.  Your next appointment:   3 month(s)  Provider:   Georganna Archer, MD    We recommend signing up for the patient portal called MyChart.  Sign up information is provided on this After Visit Summary.  MyChart  is used to connect with patients for Virtual Visits (Telemedicine).  Patients are able to view lab/test results, encounter notes, upcoming appointments, etc.  Non-urgent messages can be sent to your provider as well.   To learn more about what you can do with MyChart, go to [email or web address].   Other Instructions CHECK BLOOD PRESSURE TWICE DAILY FOR 2  WEEKS AND THEN ADVISE PROVIDER'S OFFICE ONCE COMPLETED  Blood Pressure Record Sheet To take your blood pressure, you will need a blood pressure machine. You can buy a blood pressure machine (blood pressure monitor) at your clinic, drug store, or online. When choosing one, consider: An automatic monitor that has an arm cuff. A cuff that wraps snugly around your upper arm. You should be able to fit only one finger between your arm and the cuff. A device that stores blood pressure reading results. Do not choose a monitor that measures your blood pressure from your wrist or finger. Follow your health care provider's instructions for how to take your blood pressure. To use this form: Take your blood pressure medications every day These measurements should be taken when you have been at rest for at least 10-15 min Take at least 2 readings with each blood pressure check. This makes sure the results are correct. Wait 1-2 minutes between measurements. Write down the results in the spaces on this form. Keep in mind it should always be recorded systolic over diastolic. Both numbers are important.  Repeat this every day for 2-3 weeks, or as told by your health care provider.  Make a follow-up appointment with your health care provider to discuss the results.  Blood Pressure Log Date Medications taken? (Y/N) Blood Pressure Time of Day

## 2024-04-10 NOTE — Assessment & Plan Note (Signed)
-   CAC score >700 with an anomalous RCA.  Having intermittent chest discomforts that sound noncardiac. -If his SOB and/or chest pain changes in quality or worsens then we will consider ischemic evaluation. -He is not currently taking a baby aspirin  so should start. Start 81 mg aspirin  daily

## 2024-05-12 ENCOUNTER — Ambulatory Visit (HOSPITAL_COMMUNITY)
Admission: RE | Admit: 2024-05-12 | Discharge: 2024-05-12 | Disposition: A | Source: Ambulatory Visit | Attending: Student in an Organized Health Care Education/Training Program

## 2024-05-12 DIAGNOSIS — R0602 Shortness of breath: Secondary | ICD-10-CM | POA: Insufficient documentation

## 2024-05-12 LAB — ECHOCARDIOGRAM COMPLETE
AR max vel: 2.38 cm2
AV Area VTI: 2.59 cm2
AV Area mean vel: 2.37 cm2
AV Mean grad: 9.5 mmHg
AV Peak grad: 17.3 mmHg
Ao pk vel: 2.08 m/s
Area-P 1/2: 2.93 cm2
S' Lateral: 2.1 cm

## 2024-05-13 ENCOUNTER — Encounter: Payer: Self-pay | Admitting: Student in an Organized Health Care Education/Training Program

## 2024-05-13 ENCOUNTER — Ambulatory Visit: Payer: Self-pay | Admitting: Student in an Organized Health Care Education/Training Program

## 2024-05-13 DIAGNOSIS — I350 Nonrheumatic aortic (valve) stenosis: Secondary | ICD-10-CM | POA: Insufficient documentation

## 2024-05-18 ENCOUNTER — Telehealth: Payer: Self-pay | Admitting: Neurology

## 2024-05-18 NOTE — Telephone Encounter (Signed)
 Phone room please call patient to schedule the below per Dr.Yan request .

## 2024-05-18 NOTE — Telephone Encounter (Signed)
 Patient was last seen in April 2025, please call for a follow-up appointment  in April-May 2026.  he was seen by [facility] in Nov 2025,   Last infusion was on Nov 20th and Dec 4th 2025,   is on schedule for repeat infusion in April 2026,   I called patient and his wife, agree to arrange SPX or  home health infusion, will coordinate with intrafusion   He has been on a stable regiment of Firdapse 40 mg twice daily and Mestinon  60 mg twice daily for many years with intermittent infusions of rituximab  which he says helps with his stamina/strength. His most recent rituximab  infusion in March 2025. He has had recent worsening of his symptoms with a COVID infection with worsening fatigue but he has also not been getting restful sleep and there is a concern for sleep apnea. Thus, we have suggested him to get a sleep study done.   [name] also demonstrates low mood and there is a concern for depression. We suggested that he talks to his prescribing neurologist about potentially increasing his cymbalta  dose. We also discussed with him about increasing his firdapse from 80 mg/day to 100mg /day.

## 2024-05-18 NOTE — Telephone Encounter (Signed)
 Pt has been called, scheduled and is on wait list.

## 2024-06-16 ENCOUNTER — Ambulatory Visit: Admitting: Student in an Organized Health Care Education/Training Program

## 2024-09-11 ENCOUNTER — Ambulatory Visit: Admitting: Neurology
